# Patient Record
Sex: Male | Born: 1945 | Race: White | Hispanic: No | Marital: Married | Smoking: Current every day smoker
Health system: Southern US, Community
[De-identification: ages and names within clinical notes are randomized; demographics above are authoritative.]

## PROBLEM LIST (undated history)

## (undated) DIAGNOSIS — F0391 Unspecified dementia with behavioral disturbance: Secondary | ICD-10-CM

## (undated) DIAGNOSIS — R0602 Shortness of breath: Secondary | ICD-10-CM

## (undated) DIAGNOSIS — J9601 Acute respiratory failure with hypoxia: Secondary | ICD-10-CM

## (undated) DIAGNOSIS — L309 Dermatitis, unspecified: Secondary | ICD-10-CM

## (undated) DIAGNOSIS — F419 Anxiety disorder, unspecified: Secondary | ICD-10-CM

## (undated) DIAGNOSIS — J189 Pneumonia, unspecified organism: Secondary | ICD-10-CM

## (undated) DIAGNOSIS — I10 Essential (primary) hypertension: Secondary | ICD-10-CM

## (undated) DIAGNOSIS — J441 Chronic obstructive pulmonary disease with (acute) exacerbation: Secondary | ICD-10-CM

## (undated) DIAGNOSIS — N200 Calculus of kidney: Secondary | ICD-10-CM

## (undated) DIAGNOSIS — J9602 Acute respiratory failure with hypercapnia: Secondary | ICD-10-CM

## (undated) DIAGNOSIS — Z720 Tobacco use: Secondary | ICD-10-CM

## (undated) DIAGNOSIS — K5900 Constipation, unspecified: Secondary | ICD-10-CM

## (undated) DIAGNOSIS — J449 Chronic obstructive pulmonary disease, unspecified: Secondary | ICD-10-CM

## (undated) DIAGNOSIS — D649 Anemia, unspecified: Secondary | ICD-10-CM

## (undated) DIAGNOSIS — K219 Gastro-esophageal reflux disease without esophagitis: Secondary | ICD-10-CM

## (undated) HISTORY — PX: HEMORRHOID SURGERY: SHX153

## (undated) HISTORY — PX: COLONOSCOPY: SHX174

---

## 2006-07-18 ENCOUNTER — Ambulatory Visit (HOSPITAL_COMMUNITY): Admission: RE | Admit: 2006-07-18 | Discharge: 2006-07-18 | Payer: Self-pay | Admitting: Family Medicine

## 2006-08-08 ENCOUNTER — Ambulatory Visit (HOSPITAL_COMMUNITY): Admission: RE | Admit: 2006-08-08 | Discharge: 2006-08-08 | Payer: Self-pay | Admitting: Family Medicine

## 2007-04-22 ENCOUNTER — Ambulatory Visit (HOSPITAL_COMMUNITY): Admission: RE | Admit: 2007-04-22 | Discharge: 2007-04-22 | Payer: Self-pay | Admitting: General Surgery

## 2007-04-22 ENCOUNTER — Encounter (INDEPENDENT_AMBULATORY_CARE_PROVIDER_SITE_OTHER): Payer: Self-pay | Admitting: General Surgery

## 2010-01-31 ENCOUNTER — Ambulatory Visit (HOSPITAL_COMMUNITY)
Admission: RE | Admit: 2010-01-31 | Discharge: 2010-01-31 | Payer: Self-pay | Source: Home / Self Care | Attending: Family Medicine | Admitting: Family Medicine

## 2010-06-27 NOTE — H&P (Signed)
NAMESEABORN, NAKAMA               ACCOUNT NO.:  [id number]   MEDICAL RECORD NO.:  [id number]          PATIENT TYPE:  AMB   LOCATION:  DAY                           FACILITY:  [facility]   PHYSICIAN:  Dalia Heading, M.D.  DATE OF BIRTH:  1945-04-03   DATE OF ADMISSION:  DATE OF DISCHARGE:  LH                              HISTORY & PHYSICAL   CHIEF COMPLAINT:  Need for screening colonoscopy.   HISTORY OF PRESENT ILLNESS:  The patient is a 65 year old white male who  is referred for endoscopic evaluation.  He needs a colonoscopy for  screening purposes.  No abdominal pain, weight loss, nausea, vomiting,  diarrhea, constipation, melena, or hematochezia.  He has never had a  colonoscopy.  There is no family history of colon carcinoma.   PAST MEDICAL HISTORY:  Intrinsic allergy, COPD, hypertension.   PAST SURGICAL HISTORY:  Unremarkable.   CURRENT MEDICATIONS:  1. Benicar.  2. Spiriva.  3. Albuterol inhaler.   ALLERGIES:  No known drug allergies.   REVIEW OF SYSTEMS:  Noncontributory.   PHYSICAL EXAMINATION:  GENERAL:  The patient is a well-developed, well-  nourished, white male in no acute distress.  LUNGS:  Clear to auscultation with equal breath sounds bilaterally.  HEART:  Regular rate and rhythm without S3, S4, or murmurs.  ABDOMEN:  Soft, nontender, and nondistended.  No hepatosplenomegaly or  masses are noted.  RECTAL:  Deferred to the procedure.   IMPRESSION:  Need for screening colonoscopy.   PLAN:  The patient is scheduled for a colonoscopy on April 15, 2007.  The  risks and benefits of the procedure including bleeding and perforation  were fully explained to the patient who gave informed consent.      Dalia Heading, M.D.  Electronically Signed     MAJ/MEDQ  D:  03/20/2007  T:  03/21/2007  Job:  [id number]   cc:   Kirk Ruths, M.D.  Fax: [phone]

## 2010-06-30 NOTE — Procedures (Signed)
Shane Todd, Shane Todd               ACCOUNT NO.:  [id number]   MEDICAL RECORD NO.:  [id number]          PATIENT TYPE:  OUT   LOCATION:  RESP                          FACILITY:  [facility]   PHYSICIAN:  Edward L. Juanetta Gosling, M.D.DATE OF BIRTH:  1945-09-09   DATE OF PROCEDURE:  DATE OF DISCHARGE:                            PULMONARY FUNCTION TEST   1. Spirometry shows a severe ventilatory defect with airflow      obstruction.  2. Lung volumes show airtrapping.  3. DLCO is normal.  4. Arterial blood gases are normal.  5. There was significant bronchodilator improvement.   This study is consistent with COPD.      Edward L. Juanetta Gosling, M.D.  Electronically Signed     ELH/MEDQ  D:  08/09/2006  T:  08/09/2006  Job:  [id number]   cc:   Kirk Ruths, M.D.  Fax: [phone]

## 2010-11-29 LAB — BLOOD GAS, ARTERIAL
Bicarbonate: 28.2 — ABNORMAL HIGH
O2 Saturation: 95.9
Patient temperature: 37
TCO2: 24.3
pCO2 arterial: 43.5
pH, Arterial: 7.428
pO2, Arterial: 78.3 — ABNORMAL LOW

## 2011-04-23 ENCOUNTER — Emergency Department (HOSPITAL_COMMUNITY): Payer: Medicare Other

## 2011-04-23 ENCOUNTER — Other Ambulatory Visit: Payer: Self-pay

## 2011-04-23 ENCOUNTER — Encounter (HOSPITAL_COMMUNITY): Payer: Self-pay | Admitting: *Deleted

## 2011-04-23 ENCOUNTER — Inpatient Hospital Stay (HOSPITAL_COMMUNITY)
Admission: EM | Admit: 2011-04-23 | Discharge: 2011-04-27 | DRG: 208 | Disposition: A | Discharge status: Home / Self Care | Payer: Medicare Other | Attending: Internal Medicine | Admitting: Internal Medicine

## 2011-04-23 DIAGNOSIS — R000 Tachycardia, unspecified: Secondary | ICD-10-CM | POA: Diagnosis present

## 2011-04-23 DIAGNOSIS — G4700 Insomnia, unspecified: Secondary | ICD-10-CM | POA: Diagnosis not present

## 2011-04-23 DIAGNOSIS — J9690 Respiratory failure, unspecified, unspecified whether with hypoxia or hypercapnia: Secondary | ICD-10-CM

## 2011-04-23 DIAGNOSIS — Z79899 Other long term (current) drug therapy: Secondary | ICD-10-CM

## 2011-04-23 DIAGNOSIS — F17200 Nicotine dependence, unspecified, uncomplicated: Secondary | ICD-10-CM | POA: Diagnosis present

## 2011-04-23 DIAGNOSIS — R739 Hyperglycemia, unspecified: Secondary | ICD-10-CM | POA: Diagnosis present

## 2011-04-23 DIAGNOSIS — E860 Dehydration: Secondary | ICD-10-CM | POA: Diagnosis present

## 2011-04-23 DIAGNOSIS — J441 Chronic obstructive pulmonary disease with (acute) exacerbation: Secondary | ICD-10-CM | POA: Diagnosis present

## 2011-04-23 DIAGNOSIS — J449 Chronic obstructive pulmonary disease, unspecified: Secondary | ICD-10-CM

## 2011-04-23 DIAGNOSIS — R4182 Altered mental status, unspecified: Secondary | ICD-10-CM

## 2011-04-23 DIAGNOSIS — I10 Essential (primary) hypertension: Secondary | ICD-10-CM | POA: Diagnosis present

## 2011-04-23 DIAGNOSIS — I498 Other specified cardiac arrhythmias: Secondary | ICD-10-CM | POA: Diagnosis present

## 2011-04-23 DIAGNOSIS — R7309 Other abnormal glucose: Secondary | ICD-10-CM | POA: Diagnosis present

## 2011-04-23 DIAGNOSIS — J9600 Acute respiratory failure, unspecified whether with hypoxia or hypercapnia: Principal | ICD-10-CM | POA: Diagnosis present

## 2011-04-23 DIAGNOSIS — Z720 Tobacco use: Secondary | ICD-10-CM | POA: Diagnosis present

## 2011-04-23 DIAGNOSIS — D649 Anemia, unspecified: Secondary | ICD-10-CM | POA: Diagnosis present

## 2011-04-23 DIAGNOSIS — J189 Pneumonia, unspecified organism: Secondary | ICD-10-CM | POA: Diagnosis present

## 2011-04-23 HISTORY — DX: Anemia, unspecified: D64.9

## 2011-04-23 HISTORY — DX: Anxiety disorder, unspecified: F41.9

## 2011-04-23 HISTORY — DX: Chronic obstructive pulmonary disease, unspecified: J44.9

## 2011-04-23 HISTORY — DX: Tobacco use: Z72.0

## 2011-04-23 HISTORY — DX: Essential (primary) hypertension: I10

## 2011-04-23 LAB — DIFFERENTIAL
Eosinophils Absolute: 0 10*3/uL (ref 0.0–0.7)
Eosinophils Relative: 0 % (ref 0–5)
Lymphs Abs: 1.1 10*3/uL (ref 0.7–4.0)
Monocytes Absolute: 1.1 10*3/uL — ABNORMAL HIGH (ref 0.1–1.0)
Monocytes Relative: 10 % (ref 3–12)
Neutro Abs: 8.9 10*3/uL — ABNORMAL HIGH (ref 1.7–7.7)

## 2011-04-23 LAB — CBC
HCT: 40.5 % (ref 39.0–52.0)
Hemoglobin: 12.9 g/dL — ABNORMAL LOW (ref 13.0–17.0)
RBC: 4.13 MIL/uL — ABNORMAL LOW (ref 4.22–5.81)
WBC: 11.1 10*3/uL — ABNORMAL HIGH (ref 4.0–10.5)

## 2011-04-23 LAB — URINALYSIS, ROUTINE W REFLEX MICROSCOPIC
Glucose, UA: NEGATIVE mg/dL
Leukocytes, UA: NEGATIVE
Nitrite: NEGATIVE
Specific Gravity, Urine: >1.03 — ABNORMAL HIGH (ref 1.005–1.030)
Urobilinogen, UA: 0.2 mg/dL (ref 0.0–1.0)
pH: 6 (ref 5.0–8.0)

## 2011-04-23 LAB — BLOOD GAS, ARTERIAL
Bicarbonate: 35.5 mEq/L — ABNORMAL HIGH (ref 20.0–24.0)
O2 Saturation: 99.6 %
O2 Saturation: 98.5 %
PEEP: 5 cmH2O
Patient temperature: 37
RATE: 15 resp/min
RATE: 15 resp/min
pH, Arterial: 7.356 (ref 7.350–7.450)
pH, Arterial: 7.439 (ref 7.350–7.450)
pO2, Arterial: 115 mmHg — ABNORMAL HIGH (ref 80.0–100.0)

## 2011-04-23 LAB — RAPID URINE DRUG SCREEN, HOSP PERFORMED
Amphetamines: NOT DETECTED
Barbiturates: NOT DETECTED
Cocaine: NOT DETECTED

## 2011-04-23 LAB — COMPREHENSIVE METABOLIC PANEL
Albumin: 2.9 g/dL — ABNORMAL LOW (ref 3.5–5.2)
BUN: 38 mg/dL — ABNORMAL HIGH (ref 6–23)
Calcium: 9.2 mg/dL (ref 8.4–10.5)
Chloride: 96 mEq/L (ref 96–112)
Creatinine, Ser: 0.67 mg/dL (ref 0.50–1.35)
Total Bilirubin: 0.2 mg/dL — ABNORMAL LOW (ref 0.3–1.2)
Total Protein: 6.4 g/dL (ref 6.0–8.3)

## 2011-04-23 LAB — CARDIAC PANEL(CRET KIN+CKTOT+MB+TROPI): Troponin I: <0.3 ng/mL (ref <0.30)

## 2011-04-23 LAB — MRSA PCR SCREENING: MRSA by PCR: NEGATIVE

## 2011-04-23 MED ORDER — PROPOFOL 10 MG/ML IV EMUL
INTRAVENOUS | Status: AC
  Filled 2011-04-23: qty 100

## 2011-04-23 MED ORDER — ASPIRIN 81 MG PO CHEW: 324.0000 mg | CHEWABLE_TABLET | ORAL | Status: AC

## 2011-04-23 MED ORDER — ETOMIDATE 2 MG/ML IV SOLN
INTRAVENOUS | Status: AC
  Administered 2011-04-23: 30 mg
  Filled 2011-04-23: qty 20

## 2011-04-23 MED ORDER — METHYLPREDNISOLONE SODIUM SUCC 125 MG IJ SOLR
80.0000 mg | Freq: Four times a day (QID) | INTRAMUSCULAR | Status: DC
  Administered 2011-04-23 – 2011-04-24 (×2): 80 mg via INTRAVENOUS
  Filled 2011-04-23 (×2): qty 2

## 2011-04-23 MED ORDER — ROCURONIUM BROMIDE 50 MG/5ML IV SOLN
INTRAVENOUS | Status: AC
  Administered 2011-04-23: 75 mg
  Filled 2011-04-23: qty 2

## 2011-04-23 MED ORDER — LORAZEPAM 2 MG/ML IJ SOLN
1.0000 mg | Freq: Once | INTRAMUSCULAR | Status: AC
  Administered 2011-04-23: 1 mg via INTRAVENOUS

## 2011-04-23 MED ORDER — PROPOFOL 10 MG/ML IV EMUL
5.0000 ug/kg/min | INTRAVENOUS | Status: DC
  Administered 2011-04-24: 30 ug/kg/min via INTRAVENOUS
  Administered 2011-04-24: 50 ug/kg/min via INTRAVENOUS
  Administered 2011-04-24: 30 ug/kg/min via INTRAVENOUS
  Administered 2011-04-24: 25 ug/kg/min via INTRAVENOUS
  Administered 2011-04-25: 15 ug/kg/min via INTRAVENOUS
  Filled 2011-04-23 (×5): qty 100

## 2011-04-23 MED ORDER — DEXTROSE 5 % IV SOLN
1.0000 g | Freq: Once | INTRAVENOUS | Status: AC
  Administered 2011-04-23: 1 g via INTRAVENOUS
  Filled 2011-04-23: qty 10

## 2011-04-23 MED ORDER — DEXTROSE 5 % IV SOLN
500.0000 mg | Freq: Once | INTRAVENOUS | Status: DC
  Administered 2011-04-23: 500 mg via INTRAVENOUS
  Filled 2011-04-23: qty 500

## 2011-04-23 MED ORDER — IPRATROPIUM BROMIDE 0.02 % IN SOLN
0.5000 mg | RESPIRATORY_TRACT | Status: DC
  Administered 2011-04-23 – 2011-04-25 (×12): 0.5 mg via RESPIRATORY_TRACT
  Filled 2011-04-23 (×11): qty 2.5

## 2011-04-23 MED ORDER — LORAZEPAM 2 MG/ML IJ SOLN
INTRAMUSCULAR | Status: AC
  Filled 2011-04-23: qty 1

## 2011-04-23 MED ORDER — SODIUM CHLORIDE 0.9 % IV BOLUS (SEPSIS)
1000.0000 mL | Freq: Once | INTRAVENOUS | Status: AC
  Administered 2011-04-23: 1000 mL via INTRAVENOUS

## 2011-04-23 MED ORDER — INSULIN ASPART 100 UNIT/ML ~~LOC~~ SOLN: 0.0000 [IU] | Freq: Every day | SUBCUTANEOUS | Status: DC

## 2011-04-23 MED ORDER — SODIUM CHLORIDE 0.9 % IV SOLN
INTRAVENOUS | Status: DC
  Administered 2011-04-23: 15:00:00 via INTRAVENOUS

## 2011-04-23 MED ORDER — CEFTAROLINE FOSAMIL 600 MG IV SOLR
INTRAVENOUS | Status: AC
  Filled 2011-04-23: qty 600

## 2011-04-23 MED ORDER — SODIUM CHLORIDE 0.9 % IV SOLN
600.0000 mg | Freq: Two times a day (BID) | INTRAVENOUS | Status: DC
  Administered 2011-04-23 – 2011-04-27 (×8): 600 mg via INTRAVENOUS
  Filled 2011-04-23 (×16): qty 600

## 2011-04-23 MED ORDER — ENOXAPARIN SODIUM 40 MG/0.4ML ~~LOC~~ SOLN
40.0000 mg | SUBCUTANEOUS | Status: DC
  Administered 2011-04-23 – 2011-04-26 (×4): 40 mg via SUBCUTANEOUS
  Filled 2011-04-23 (×2): qty 0.4
  Filled 2011-04-23: qty 0.8

## 2011-04-23 MED ORDER — ETOMIDATE 2 MG/ML IV SOLN
INTRAVENOUS | Status: AC
  Filled 2011-04-23: qty 20

## 2011-04-23 MED ORDER — SUCCINYLCHOLINE CHLORIDE 20 MG/ML IJ SOLN
INTRAMUSCULAR | Status: AC
  Filled 2011-04-23: qty 1

## 2011-04-23 MED ORDER — ALBUTEROL SULFATE (5 MG/ML) 0.5% IN NEBU
5.0000 mg | INHALATION_SOLUTION | Freq: Once | RESPIRATORY_TRACT | Status: AC
  Administered 2011-04-23: 5 mg via RESPIRATORY_TRACT
  Filled 2011-04-23: qty 1

## 2011-04-23 MED ORDER — SODIUM CHLORIDE 0.9 % IN NEBU
INHALATION_SOLUTION | RESPIRATORY_TRACT | Status: AC
  Administered 2011-04-23: 3 mL
  Filled 2011-04-23: qty 3

## 2011-04-23 MED ORDER — SODIUM CHLORIDE 0.9 % IV SOLN: 250.0000 mL | INTRAVENOUS | Status: DC | PRN

## 2011-04-23 MED ORDER — ALBUTEROL (5 MG/ML) CONTINUOUS INHALATION SOLN: 10.0000 mg/h | INHALATION_SOLUTION | Freq: Once | RESPIRATORY_TRACT | Status: DC

## 2011-04-23 MED ORDER — ASPIRIN 300 MG RE SUPP
300.0000 mg | RECTAL | Status: AC
  Administered 2011-04-23: 300 mg via RECTAL
  Filled 2011-04-23: qty 1

## 2011-04-23 MED ORDER — LIDOCAINE HCL (CARDIAC) 20 MG/ML IV SOLN
INTRAVENOUS | Status: AC
  Filled 2011-04-23: qty 5

## 2011-04-23 MED ORDER — INSULIN ASPART 100 UNIT/ML ~~LOC~~ SOLN
0.0000 [IU] | Freq: Three times a day (TID) | SUBCUTANEOUS | Status: DC
  Administered 2011-04-24: 3 [IU] via SUBCUTANEOUS
  Administered 2011-04-24: 4 [IU] via SUBCUTANEOUS
  Administered 2011-04-24 – 2011-04-25 (×2): 3 [IU] via SUBCUTANEOUS

## 2011-04-23 MED ORDER — PANTOPRAZOLE SODIUM 40 MG IV SOLR
40.0000 mg | Freq: Every day | INTRAVENOUS | Status: DC
  Administered 2011-04-23 – 2011-04-24 (×2): 40 mg via INTRAVENOUS
  Filled 2011-04-23 (×2): qty 40

## 2011-04-23 MED ORDER — VANCOMYCIN HCL IN DEXTROSE 1-5 GM/200ML-% IV SOLN
1000.0000 mg | Freq: Two times a day (BID) | INTRAVENOUS | Status: DC
  Administered 2011-04-23 – 2011-04-25 (×4): 1000 mg via INTRAVENOUS
  Filled 2011-04-23 (×4): qty 200

## 2011-04-23 MED ORDER — MIDAZOLAM HCL 2 MG/2ML IJ SOLN
1.0000 mg | INTRAMUSCULAR | Status: DC | PRN
  Administered 2011-04-24 (×3): 2 mg via INTRAVENOUS
  Administered 2011-04-25: 1 mg via INTRAVENOUS
  Administered 2011-04-25: 2 mg via INTRAVENOUS
  Filled 2011-04-23 (×5): qty 2

## 2011-04-23 MED ORDER — ALBUTEROL SULFATE (5 MG/ML) 0.5% IN NEBU
2.5000 mg | INHALATION_SOLUTION | RESPIRATORY_TRACT | Status: DC
  Administered 2011-04-23 – 2011-04-25 (×11): 2.5 mg via RESPIRATORY_TRACT
  Filled 2011-04-23 (×11): qty 0.5

## 2011-04-23 MED ORDER — ROCURONIUM BROMIDE 50 MG/5ML IV SOLN
INTRAVENOUS | Status: AC
  Filled 2011-04-23: qty 2

## 2011-04-23 MED ORDER — POTASSIUM CHLORIDE IN NACL 20-0.9 MEQ/L-% IV SOLN
INTRAVENOUS | Status: DC
  Administered 2011-04-23: 18:00:00 via INTRAVENOUS
  Administered 2011-04-24: 1000 mL via INTRAVENOUS
  Filled 2011-04-23: qty 1000

## 2011-04-23 MED ORDER — FENTANYL CITRATE 0.05 MG/ML IJ SOLN: 25.0000 ug | INTRAMUSCULAR | Status: DC | PRN

## 2011-04-23 MED ORDER — PROPOFOL 10 MG/ML IV EMUL
5.0000 ug/kg/min | INTRAVENOUS | Status: DC
  Administered 2011-04-23: 5 ug/kg/min via INTRAVENOUS
  Filled 2011-04-23: qty 20

## 2011-04-23 MED ORDER — VANCOMYCIN HCL IN DEXTROSE 1-5 GM/200ML-% IV SOLN
INTRAVENOUS | Status: AC
  Filled 2011-04-23: qty 400

## 2011-04-23 MED ORDER — METHYLPREDNISOLONE SODIUM SUCC 125 MG IJ SOLR
125.0000 mg | Freq: Once | INTRAMUSCULAR | Status: AC
  Administered 2011-04-23: 125 mg via INTRAVENOUS
  Filled 2011-04-23: qty 2

## 2011-04-23 NOTE — Progress Notes (Signed)
ANTIBIOTIC CONSULT NOTE - INITIAL  Pharmacy Consult for  Vancomycin Indication: pneumonia  No Known Allergies  Patient Measurements: Height: 6\' 1"  (185.4 cm) Weight: 160 lb (72.576 kg) IBW/kg (Calculated) : 79.9  Adjusted Body Weight: n/a  Vital Signs: Temp: 96.9 F (36.1 C) (03/11 1549) Temp src: Rectal (03/11 1549) BP: 115/62 mmHg (03/11 1730) Pulse Rate: 83  (03/11 1730) Intake/Output from previous day:   Intake/Output from this shift:    Labs:  Basename 04/23/11 1306  WBC 11.1*  HGB 12.9*  PLT 310  LABCREA --  CREATININE 0.67   Estimated Creatinine Clearance: 93.3 ml/min (by C-G formula based on Cr of 0.67).    Microbiology: No results found for this or any previous visit (from the past 720 hour(s)).  Medical History: Past Medical History  Diagnosis Date  . COPD (chronic obstructive pulmonary disease)   . Hypertension   . Tobacco abuse     Medications:   Reviewed Assessment: Empiric therapy  Goal of Therapy:  Vancomycin trough level 15-20 mcg/ml  Plan:  Vancomycin 1000 mg IV every 12 hours  Tomi Bamberger J 04/23/2011,7:11 PM

## 2011-04-23 NOTE — H&P (Signed)
Shane Todd MRN: [id number] DOB/AGE: October 11, 1945 66 y.o. Primary Care Physician:MCGOUGH,WILLIAM M, MD, MD Admit date: 04/23/2011 Chief Complaint: Shortness of breath History of present illness: The patient is a 66 year old man with a past medical history significant for COPD and hypertension, who presents to the emergency department with a chief complaint of shortness of breath. The patient decompensated in the emergency department and was not protecting his airway. Therefore, he was intubated and mechanically ventilated. The history is being provided by his wife, Mrs. Juanito Doom. Accordingly, over the past few days, the patient has complained of shortness of breath. He had flulike symptoms including shortness of breath, fever, chills, and a productive cough with green sputum all of last week. He had also been wheezing. He took Mucinex which did not help. His appetite decreased. His urine became concentrated. He became more lethargic and sleepy this morning. At approximately for 10:30 AM, he was noticed to be grunting. He became virtually nonresponsive. EMS was called. When they arrived, reportedly, his oxygen saturation was 85%. He was given a breathing treatment and oxygen and transported to the emergency department.  In the emergency department, he is hemodynamically stable and afebrile. Just recently, his blood pressure did decrease to the 90s systolically and his heart rate went up to 122, this is status post intubation. His initial chest x-ray revealed left basilar consolidation/pneumonia. His EKG revealed normal sinus rhythm with no acute abnormalities. His ABG following intubation revealed a pH of 7.35, PCO2 of 67, and a PO2 of 494. His urine drug screen is unremarkable. His urine specific gravity is greater than 1.030. He is being admitted for further evaluation and management.   Past Medical History  Diagnosis Date  . COPD (chronic obstructive pulmonary disease)   . Hypertension   .  Tobacco abuse     No past surgical history on file.  Prior to Admission medications   Not on File  Medications: 1. Lisinopril 5 mg once daily. 2. Amlodipine 5 mg once daily. 3. Proventil HF a 90 mcg one puff 4 times daily as needed. 4. Spiriva inhaler one inhalation daily. 5. Alprazolam 0.5 mg 1-2 daily as needed.  Allergies: No Known Allergies  Family history: His mother died of a stroke. His father died of a heart attack.  Social History: The patient is married. He lives in [place]. He is a retired IT sales professional. He has 3 children. He smokes a half a pack to a pack of cigarettes per day. He has been smoking for more than 40 years. No history of alcohol or illicit drug use.      ROS: As above in history present illness.  PHYSICAL EXAM: Blood pressure 106/65, pulse 81, resp. rate 15, height 6\' 1"  (1.854 m), weight 72.576 kg (160 lb), SpO2 100.00%.   Basic Metabolic Panel:  Basename 04/23/11 1306  NA 139  K 4.9  CL 96  CO2 40*  GLUCOSE 149*  BUN 38*  CREATININE 0.67  CALCIUM 9.2  MG --  PHOS --   Liver Function Tests:  Basename 04/23/11 1306  AST 20  ALT 15  ALKPHOS 93  BILITOT 0.2*  PROT 6.4  ALBUMIN 2.9*   No results found for this basename: LIPASE:2,AMYLASE:2 in the last 72 hours No results found for this basename: AMMONIA:2 in the last 72 hours CBC:  Basename 04/23/11 1306  WBC 11.1*  NEUTROABS 8.9*  HGB 12.9*  HCT 40.5  MCV 98.1  PLT 310   Cardiac Enzymes: No results found  for this basename: CKTOTAL:3,CKMB:3,CKMBINDEX:3,TROPONINI:3 in the last 72 hours BNP: No results found for this basename: PROBNP:3 in the last 72 hours D-Dimer: No results found for this basename: DDIMER:2 in the last 72 hours CBG: No results found for this basename: GLUCAP:6 in the last 72 hours Hemoglobin A1C: No results found for this basename: HGBA1C in the last 72 hours Fasting Lipid Panel: No results found for this basename:  CHOL,HDL,LDLCALC,TRIG,CHOLHDL,LDLDIRECT in the last 72 hours Thyroid Function Tests: No results found for this basename: TSH,T4TOTAL,FREET4,T3FREE,THYROIDAB in the last 72 hours Anemia Panel: No results found for this basename: VITAMINB12,FOLATE,FERRITIN,TIBC,IRON,RETICCTPCT in the last 72 hours Coagulation: No results found for this basename: LABPROT:2,INR:2 in the last 72 hours Urine Drug Screen: Drugs of Abuse     Component Value Date/Time   LABOPIA NONE DETECTED 04/23/2011 1259   COCAINSCRNUR NONE DETECTED 04/23/2011 1259   LABBENZ NONE DETECTED 04/23/2011 1259   AMPHETMU NONE DETECTED 04/23/2011 1259   THCU NONE DETECTED 04/23/2011 1259   LABBARB NONE DETECTED 04/23/2011 1259    Alcohol Level: No results found for this basename: ETH:2 in the last 72 hours Urinalysis:  Basename 04/23/11 1259  COLORURINE YELLOW  LABSPEC >1.030*  PHURINE 6.0  GLUCOSEU NEGATIVE  HGBUR SMALL*  BILIRUBINUR NEGATIVE  KETONESUR NEGATIVE  PROTEINUR 30*  UROBILINOGEN 0.2  NITRITE NEGATIVE  LEUKOCYTESUR NEGATIVE   Misc. Labs:    No results found for this or any previous visit (from the past 240 hour(s)).   Results for orders placed during the hospital encounter of 04/23/11 (from the past 48 hour(s))  URINE RAPID DRUG SCREEN (HOSP PERFORMED)     Status: Normal   Collection Time   04/23/11 12:59 PM      Component Value Range Comment   Opiates NONE DETECTED  NONE DETECTED     Cocaine NONE DETECTED  NONE DETECTED     Benzodiazepines NONE DETECTED  NONE DETECTED     Amphetamines NONE DETECTED  NONE DETECTED     Tetrahydrocannabinol NONE DETECTED  NONE DETECTED     Barbiturates NONE DETECTED  NONE DETECTED    URINALYSIS, ROUTINE W REFLEX MICROSCOPIC     Status: Abnormal   Collection Time   04/23/11 12:59 PM      Component Value Range Comment   Color, Urine YELLOW  YELLOW     APPearance CLEAR  CLEAR     Specific Gravity, Urine >1.030 (*) 1.005 - 1.030     pH 6.0  5.0 - 8.0     Glucose, UA  NEGATIVE  NEGATIVE (mg/dL)    Hgb urine dipstick SMALL (*) NEGATIVE     Bilirubin Urine NEGATIVE  NEGATIVE     Ketones, ur NEGATIVE  NEGATIVE (mg/dL)    Protein, ur 30 (*) NEGATIVE (mg/dL)    Urobilinogen, UA 0.2  0.0 - 1.0 (mg/dL)    Nitrite NEGATIVE  NEGATIVE     Leukocytes, UA NEGATIVE  NEGATIVE    URINE MICROSCOPIC-ADD ON     Status: Normal   Collection Time   04/23/11 12:59 PM      Component Value Range Comment   WBC, UA 0-2  <3 (WBC/hpf)    RBC / HPF 3-6  <3 (RBC/hpf)   LACTIC ACID, PLASMA     Status: Normal   Collection Time   04/23/11  1:05 PM      Component Value Range Comment   Lactic Acid, Venous 0.9  0.5 - 2.2 (mmol/L)   CBC     Status: Abnormal  Collection Time   04/23/11  1:06 PM      Component Value Range Comment   WBC 11.1 (*) 4.0 - 10.5 (K/uL)    RBC 4.13 (*) 4.22 - 5.81 (MIL/uL)    Hemoglobin 12.9 (*) 13.0 - 17.0 (g/dL)    HCT 66.4  40.3 - 47.4 (%)    MCV 98.1  78.0 - 100.0 (fL)    MCH 31.2  26.0 - 34.0 (pg)    MCHC 31.9  30.0 - 36.0 (g/dL)    RDW 25.9  56.3 - 87.5 (%)    Platelets 310  150 - 400 (K/uL)   DIFFERENTIAL     Status: Abnormal   Collection Time   04/23/11  1:06 PM      Component Value Range Comment   Neutrophils Relative 80 (*) 43 - 77 (%)    Neutro Abs 8.9 (*) 1.7 - 7.7 (K/uL)    Lymphocytes Relative 10 (*) 12 - 46 (%)    Lymphs Abs 1.1  0.7 - 4.0 (K/uL)    Monocytes Relative 10  3 - 12 (%)    Monocytes Absolute 1.1 (*) 0.1 - 1.0 (K/uL)    Eosinophils Relative 0  0 - 5 (%)    Eosinophils Absolute 0.0  0.0 - 0.7 (K/uL)    Basophils Relative 0  0 - 1 (%)    Basophils Absolute 0.0  0.0 - 0.1 (K/uL)   COMPREHENSIVE METABOLIC PANEL     Status: Abnormal   Collection Time   04/23/11  1:06 PM      Component Value Range Comment   Sodium 139  135 - 145 (mEq/L)    Potassium 4.9  3.5 - 5.1 (mEq/L)    Chloride 96  96 - 112 (mEq/L)    CO2 40 (*) 19 - 32 (mEq/L)    Glucose, Bld 149 (*) 70 - 99 (mg/dL)    BUN 38 (*) 6 - 23 (mg/dL)    Creatinine, Ser  6.43  0.50 - 1.35 (mg/dL)    Calcium 9.2  8.4 - 10.5 (mg/dL)    Total Protein 6.4  6.0 - 8.3 (g/dL)    Albumin 2.9 (*) 3.5 - 5.2 (g/dL)    AST 20  0 - 37 (U/L)    ALT 15  0 - 53 (U/L)    Alkaline Phosphatase 93  39 - 117 (U/L)    Total Bilirubin 0.2 (*) 0.3 - 1.2 (mg/dL)    GFR calc non Af Amer >90  >90 (mL/min)    GFR calc Af Amer >90  >90 (mL/min)   BLOOD GAS, ARTERIAL     Status: Abnormal   Collection Time   04/23/11  2:05 PM      Component Value Range Comment   FIO2 100.00      Delivery systems VENTILATOR      Mode PRESSURE REGULATED VOLUME CONTROL      VT 500      Rate 15      Peep/cpap 0.5      pH, Arterial 7.356  7.350 - 7.450     pCO2 arterial 67.2 (*) 35.0 - 45.0 (mmHg)    pO2, Arterial 494.0 (*) 80.0 - 100.0 (mmHg)    Bicarbonate 36.7 (*) 20.0 - 24.0 (mEq/L)    TCO2 33.5  0 - 100 (mmol/L)    Acid-Base Excess 10.9 (*) 0.0 - 2.0 (mmol/L)    O2 Saturation 99.6      Patient temperature 37.0      Collection  site RIGHT RADIAL      Drawn by COLLECTED BY RT      Allens test (pass/fail) PASS  PASS      Ct Head Wo Contrast  04/23/2011  *RADIOLOGY REPORT*  Clinical Data: Shortness of breath, unresponsive  CT HEAD WITHOUT CONTRAST  Technique:  Contiguous axial images were obtained from the base of the skull through the vertex without contrast.  Comparison: None.  Findings: No evidence of parenchymal hemorrhage or extra-axial fluid collection. No mass lesion, mass effect, or midline shift.  No CT evidence of acute infarction.  Subcortical white matter and periventricular small vessel ischemic changes, including in the subcortical left frontal lobe (series 2/image 21).  Intracranial atherosclerosis.  Cerebral volume is age appropriate.  No ventriculomegaly.  The visualized paranasal sinuses are essentially clear. The mastoid air cells are unopacified.  No evidence of calvarial fracture.  IMPRESSION: No evidence of acute intracranial abnormality.  Small vessel ischemic changes with  intracranial atherosclerosis.  Original Report Authenticated By: Charline Bills, M.D.   Dg Chest Portable 1 View  04/23/2011  *RADIOLOGY REPORT*  Clinical Data: Shortness of breath.  ET tube placement.  PORTABLE CHEST - 1 VIEW  Comparison: 01/31/2010  Findings: Endotracheal tube is 9 cm above the carina.  Severe COPD changes.  Lung bases are incompletely imaged.  Concern for consolidation in the left lung base.  Cannot assess for effusions. Heart is normal size.  IMPRESSION: Endotracheal tube 9 cm above the carina.  Limited evaluation of the lung bases.  Suspect left basilar consolidation/pneumonia.  Original Report Authenticated By: Cyndie Chime, M.D.   Dg Chest Port 1v Same Day  04/23/2011  *RADIOLOGY REPORT*  Clinical Data: Shortness of breath.  PORTABLE CHEST - 1 VIEW SAME DAY  Comparison: Earlier the same day  Findings: 1417 hours.  Endotracheal tube tip is 6.8 cm above the base of the carina. Hyperexpansion is consistent with emphysema. Interstitial markings are diffusely coarsened with chronic features.  There is biapical pleuroparenchymal density, as before. The airspace opacity the left base is again noted. Telemetry leads overlie the chest.  IMPRESSION: Airspace disease at the left base is suspicious for pneumonia.  Biapical pleuroparenchymal changes may be related to scarring. Follow-up is recommended to further evaluate.  Original Report Authenticated By: ERIC A. MANSELL, M.D.    Impression:  Principal Problem:  *Acute respiratory failure Active Problems:  COPD with exacerbation  Community acquired pneumonia  Sinus tachycardia  Tobacco abuse  Hyperglycemia   1. Acute ventilator dependent respiratory failure, secondary to a combination of COPD with exacerbation and community-acquired pneumonia.  2. Sinus tachycardia secondary to respiratory distress.  3. Tobacco abuse.  4. Hyperglycemia. Possibly secondary to IV steroids.   5. Dehydration as evident by an elevated BUN and  elevated urine specific gravity.  Plan:  1. The patient is status post intubation and mechanical ventilation. The results of his ABG are noted. The oxygen infiltration will be titrated down.  2. He was given azithromycin and Rocephin in the emergency department. He was also given 125 mg of Solu-Medrol. We'll continue antibiotic treatment with Teflaro and steroid treatment with Solu-Medrol. Will add albuterol and Atrovent nebulizers every four-hour schedule. Next  3. [facility] consult pulmonologist Dr. Juanetta Gosling. In the meantime, the patient will be placed on the appropriate sedation protocol. Prophylactic Protonix will be started. An NG tube will be placed per protocol and gentle to feedings will be started over the next 24-48 hours. Next  4. For further evaluation, we will  order cardiac enzymes, TSH, urine Legionella antigen, strep pneumo urinary antigen, and blood cultures. We'll also order a tracheal aspirate culture.  5. Tobacco cessation counseling when he is extubated.    Total critical care time: One hour 20 minutes.   Haliegh Khurana 04/23/2011, 3:30 PM

## 2011-04-23 NOTE — ED Notes (Signed)
Critical value CO2 67.2 shown to Dr. Brooke Dare.

## 2011-04-23 NOTE — ED Notes (Signed)
Diprivan titration up to 30 mcg/kg/min (13.1 ml/hr).

## 2011-04-23 NOTE — ED Provider Notes (Signed)
History   This chart was scribed for Dayton Bailiff, MD by Clarita Crane. The patient was seen in room APA02/APA02. Patient's care was started at 1150.   CSN: [id number]  Arrival date & time 04/23/11  1150   First MD Initiated Contact with Patient 04/23/11 1212      Chief Complaint  Patient presents with  . Shortness of Breath    (Consider location/radiation/quality/duration/timing/severity/associated sxs/prior treatment) The history is provided by the EMS personnel (Family member). The history is limited by the condition of the patient.  A Level 5 Caveat Applies due to patient's condition Shane Todd is a 66 y.o. male who presents to the Emergency Department after being BIB EMS from home for severe SOB. Per EMS, patient was unresponsive upon arrival. Patient's family member notes patient had been experiencing flu symptoms for the past week which became significantly worse the past several days. Patient with h/o COPD and HTN.  Past Medical History  Diagnosis Date  . COPD (chronic obstructive pulmonary disease)   . Hypertension     No past surgical history on file.  No family history on file.  History  Substance Use Topics  . Smoking status: Unknown If Ever Smoked  . Smokeless tobacco: Not on file  . Alcohol Use: No     Review of Systems  Unable to perform ROS: Other   Allergies  Review of patient's allergies indicates no known allergies.  Home Medications  No current outpatient prescriptions on file.  BP 96/62  Pulse 122  Resp 15  Ht 6\' 1"  (1.854 m)  Wt 160 lb (72.576 kg)  BMI 21.11 kg/m2  SpO2 100%  Physical Exam  Nursing note and vitals reviewed. Constitutional: He appears well-developed and well-nourished. He appears lethargic. He appears distressed.       Somnolent  HENT:  Head: Normocephalic and atraumatic.  Eyes: Pupils are equal, round, and reactive to light.  Neck: Neck supple. No tracheal deviation present.  Cardiovascular: Regular rhythm.   Tachycardia present.  Exam reveals no gallop and no friction rub.   No murmur heard. Pulmonary/Chest: He is in respiratory distress. He has wheezes. He has no rales.       Severely diminished breath sounds bilaterally. Increased labor of breathing. Faint wheezing.   Abdominal: Soft. He exhibits no distension.  Musculoskeletal: He exhibits no edema.  Neurological: He appears lethargic. No sensory deficit.       Unable to answer questions. Responds to verbal stimuli with eye opening. Non-verbal.   Skin: Skin is warm and dry.  Psychiatric: He has a normal mood and affect. His behavior is normal.    ED Course  Procedures (including critical care time)   Date: 04/23/2011  Rate: 86  Rhythm: normal sinus rhythm  QRS Axis: normal  Intervals: normal  ST/T Wave abnormalities: normal  Conduction Disutrbances:none  Narrative Interpretation:   Old EKG Reviewed: unchanged  CRITICAL CARE Performed by: Dayton Bailiff   Total critical care time: 32 min  Critical care time was exclusive of separately billable procedures and treating other patients.  Critical care was necessary to treat or prevent imminent or life-threatening deterioration.  Critical care was time spent personally by me on the following activities: development of treatment plan with patient and/or surrogate as well as nursing, discussions with consultants, evaluation of patient's response to treatment, examination of patient, obtaining history from patient or surrogate, ordering and performing treatments and interventions, ordering and review of laboratory studies, ordering and review of radiographic studies, pulse  oximetry and re-evaluation of patient's condition.  EMERGENT INTUBATION PROCEDURE NOTE INDICATION: impending respiratory failure  TECHNIQUE: Unable to obtain consent because of emergent medical necessity.  After pre-oxygenating the patient for several minutes, a modified rapid-sequence induction was performed using  etomidate and rocuronium with cricoid pressure. Using a Glidescope- 4 laryngoscope blade and 8.21mm cuffed endotracheal tube was placed and secured at 24cm at the lip. Placement was confirmed with by auscultation, by CXR and ETCO2 monitor.  COMPLICATIONS: None. The patient tolerated the procedure well with no complications. POST PROCEDURE CXR: pending    DIAGNOSTIC STUDIES: Oxygen Saturation is 98% on non-rebreather, normal by my interpretation.    COORDINATION OF CARE: 12:23PM- Additional history obtained by patient's family member. 12:25PM- Dayton Bailiff, MD to beside to perform intubation.    Results for orders placed during the hospital encounter of 04/23/11  LACTIC ACID, PLASMA      Component Value Range   Lactic Acid, Venous 0.9  0.5 - 2.2 (mmol/L)  URINE RAPID DRUG SCREEN (HOSP PERFORMED)      Component Value Range   Opiates NONE DETECTED  NONE DETECTED    Cocaine NONE DETECTED  NONE DETECTED    Benzodiazepines NONE DETECTED  NONE DETECTED    Amphetamines NONE DETECTED  NONE DETECTED    Tetrahydrocannabinol NONE DETECTED  NONE DETECTED    Barbiturates NONE DETECTED  NONE DETECTED   CBC      Component Value Range   WBC 11.1 (*) 4.0 - 10.5 (K/uL)   RBC 4.13 (*) 4.22 - 5.81 (MIL/uL)   Hemoglobin 12.9 (*) 13.0 - 17.0 (g/dL)   HCT 40.9  81.1 - 91.4 (%)   MCV 98.1  78.0 - 100.0 (fL)   MCH 31.2  26.0 - 34.0 (pg)   MCHC 31.9  30.0 - 36.0 (g/dL)   RDW 78.2  95.6 - 21.3 (%)   Platelets 310  150 - 400 (K/uL)  DIFFERENTIAL      Component Value Range   Neutrophils Relative 80 (*) 43 - 77 (%)   Neutro Abs 8.9 (*) 1.7 - 7.7 (K/uL)   Lymphocytes Relative 10 (*) 12 - 46 (%)   Lymphs Abs 1.1  0.7 - 4.0 (K/uL)   Monocytes Relative 10  3 - 12 (%)   Monocytes Absolute 1.1 (*) 0.1 - 1.0 (K/uL)   Eosinophils Relative 0  0 - 5 (%)   Eosinophils Absolute 0.0  0.0 - 0.7 (K/uL)   Basophils Relative 0  0 - 1 (%)   Basophils Absolute 0.0  0.0 - 0.1 (K/uL)  COMPREHENSIVE METABOLIC PANEL       Component Value Range   Sodium 139  135 - 145 (mEq/L)   Potassium 4.9  3.5 - 5.1 (mEq/L)   Chloride 96  96 - 112 (mEq/L)   CO2 40 (*) 19 - 32 (mEq/L)   Glucose, Bld 149 (*) 70 - 99 (mg/dL)   BUN 38 (*) 6 - 23 (mg/dL)   Creatinine, Ser 0.86  0.50 - 1.35 (mg/dL)   Calcium 9.2  8.4 - 57.8 (mg/dL)   Total Protein 6.4  6.0 - 8.3 (g/dL)   Albumin 2.9 (*) 3.5 - 5.2 (g/dL)   AST 20  0 - 37 (U/L)   ALT 15  0 - 53 (U/L)   Alkaline Phosphatase 93  39 - 117 (U/L)   Total Bilirubin 0.2 (*) 0.3 - 1.2 (mg/dL)   GFR calc non Af Amer >90  >90 (mL/min)   GFR calc Af Amer >  90  >90 (mL/min)  URINALYSIS, ROUTINE W REFLEX MICROSCOPIC      Component Value Range   Color, Urine YELLOW  YELLOW    APPearance CLEAR  CLEAR    Specific Gravity, Urine >1.030 (*) 1.005 - 1.030    pH 6.0  5.0 - 8.0    Glucose, UA NEGATIVE  NEGATIVE (mg/dL)   Hgb urine dipstick SMALL (*) NEGATIVE    Bilirubin Urine NEGATIVE  NEGATIVE    Ketones, ur NEGATIVE  NEGATIVE (mg/dL)   Protein, ur 30 (*) NEGATIVE (mg/dL)   Urobilinogen, UA 0.2  0.0 - 1.0 (mg/dL)   Nitrite NEGATIVE  NEGATIVE    Leukocytes, UA NEGATIVE  NEGATIVE   BLOOD GAS, ARTERIAL      Component Value Range   FIO2 100.00     Delivery systems VENTILATOR     Mode PRESSURE REGULATED VOLUME CONTROL     VT 500     Rate 15     Peep/cpap 0.5     pH, Arterial 7.356  7.350 - 7.450    pCO2 arterial 67.2 (*) 35.0 - 45.0 (mmHg)   pO2, Arterial 494.0 (*) 80.0 - 100.0 (mmHg)   Bicarbonate 36.7 (*) 20.0 - 24.0 (mEq/L)   TCO2 33.5  0 - 100 (mmol/L)   Acid-Base Excess 10.9 (*) 0.0 - 2.0 (mmol/L)   O2 Saturation 99.6     Patient temperature 37.0     Collection site RIGHT RADIAL     Drawn by COLLECTED BY RT     Allens test (pass/fail) PASS  PASS   URINE MICROSCOPIC-ADD ON      Component Value Range   WBC, UA 0-2  <3 (WBC/hpf)   RBC / HPF 3-6  <3 (RBC/hpf)    Ct Head Wo Contrast  04/23/2011  *RADIOLOGY REPORT*  Clinical Data: Shortness of breath, unresponsive  CT HEAD  WITHOUT CONTRAST  Technique:  Contiguous axial images were obtained from the base of the skull through the vertex without contrast.  Comparison: None.  Findings: No evidence of parenchymal hemorrhage or extra-axial fluid collection. No mass lesion, mass effect, or midline shift.  No CT evidence of acute infarction.  Subcortical white matter and periventricular small vessel ischemic changes, including in the subcortical left frontal lobe (series 2/image 21).  Intracranial atherosclerosis.  Cerebral volume is age appropriate.  No ventriculomegaly.  The visualized paranasal sinuses are essentially clear. The mastoid air cells are unopacified.  No evidence of calvarial fracture.  IMPRESSION: No evidence of acute intracranial abnormality.  Small vessel ischemic changes with intracranial atherosclerosis.  Original Report Authenticated By: Charline Bills, M.D.   Dg Chest Portable 1 View  04/23/2011  *RADIOLOGY REPORT*  Clinical Data: Shortness of breath.  ET tube placement.  PORTABLE CHEST - 1 VIEW  Comparison: 01/31/2010  Findings: Endotracheal tube is 9 cm above the carina.  Severe COPD changes.  Lung bases are incompletely imaged.  Concern for consolidation in the left lung base.  Cannot assess for effusions. Heart is normal size.  IMPRESSION: Endotracheal tube 9 cm above the carina.  Limited evaluation of the lung bases.  Suspect left basilar consolidation/pneumonia.  Original Report Authenticated By: Cyndie Chime, M.D.     1. Altered mental status   2. Respiratory failure   3. COPD (chronic obstructive pulmonary disease)   4. CAP (community acquired pneumonia)       MDM  Patient arrived with depressed mental status and significant respiratory distress. His mental status is not adequate for BiPAP  therefore he was promptly intubated. Was discovered to have 3 acquired pneumonia. Placed on Rocephin Zithromax. Blood cultures were drawn. Aerosols were administered through the endotracheal tube. Head CT  is negative. Remainder of his work is relatively unremarkable. He does have an elevated CO2 on ABG. Feel this is likely secondary to a COPD exacerbation with complicating pneumonia. Will be admitted to the ICU given the respiratory failure. Discussed with the triad hospitalists.      I personally performed the services described in this documentation, which was scribed in my presence. The recorded information has been reviewed and considered.    Dayton Bailiff, MD 04/23/11 1425

## 2011-04-23 NOTE — ED Notes (Signed)
Short of breath for 2 days °

## 2011-04-23 NOTE — ED Notes (Signed)
CRITICAL VALUE ALERT  Critical value received:  CO2  Date of notification:  04/23/11  Time of notification:  1346  Critical value read back:yes  Nurse who received alert:  Primitivo Gauze  MD notified (1st page):  1346  Time of first page:  1346  MD notified (2nd page):  Time of second page:  Responding MD:  Dr. Brooke Dare  Time MD responded:  1346

## 2011-04-24 ENCOUNTER — Encounter (HOSPITAL_COMMUNITY): Payer: Self-pay | Admitting: Internal Medicine

## 2011-04-24 DIAGNOSIS — D649 Anemia, unspecified: Secondary | ICD-10-CM

## 2011-04-24 HISTORY — DX: Anemia, unspecified: D64.9

## 2011-04-24 LAB — BLOOD GAS, ARTERIAL
Bicarbonate: 33.3 mEq/L — ABNORMAL HIGH (ref 20.0–24.0)
PEEP: 5 cmH2O
Patient temperature: 37
TCO2: 30.4 mmol/L (ref 0–100)
pCO2 arterial: 52.7 mmHg — ABNORMAL HIGH (ref 35.0–45.0)
pH, Arterial: 7.417 (ref 7.350–7.450)
pO2, Arterial: 115 mmHg — ABNORMAL HIGH (ref 80.0–100.0)

## 2011-04-24 LAB — CBC
HCT: 33.6 % — ABNORMAL LOW (ref 39.0–52.0)
Hemoglobin: 10.7 g/dL — ABNORMAL LOW (ref 13.0–17.0)
RBC: 3.5 MIL/uL — ABNORMAL LOW (ref 4.22–5.81)

## 2011-04-24 LAB — CARDIAC PANEL(CRET KIN+CKTOT+MB+TROPI)
CK, MB: 2.1 ng/mL (ref 0.3–4.0)
CK, MB: 1.6 ng/mL (ref 0.3–4.0)
Relative Index: INVALID (ref 0.0–2.5)
Total CK: 31 U/L (ref 7–232)
Troponin I: <0.3 ng/mL (ref <0.30)
Troponin I: <0.3 ng/mL (ref <0.30)

## 2011-04-24 LAB — GLUCOSE, CAPILLARY
Glucose-Capillary: 166 mg/dL — ABNORMAL HIGH (ref 70–99)
Glucose-Capillary: 124 mg/dL — ABNORMAL HIGH (ref 70–99)

## 2011-04-24 LAB — BASIC METABOLIC PANEL
BUN: 39 mg/dL — ABNORMAL HIGH (ref 6–23)
Chloride: 102 mEq/L (ref 96–112)
GFR calc Af Amer: >90 mL/min (ref >90)
GFR calc non Af Amer: >90 mL/min (ref >90)
Glucose, Bld: 152 mg/dL — ABNORMAL HIGH (ref 70–99)
Potassium: 4.3 mEq/L (ref 3.5–5.1)
Sodium: 139 mEq/L (ref 135–145)

## 2011-04-24 LAB — LEGIONELLA ANTIGEN, URINE: Legionella Antigen, Urine: NEGATIVE

## 2011-04-24 LAB — TSH: TSH: 0.363 u[IU]/mL (ref 0.350–4.500)

## 2011-04-24 LAB — URINE CULTURE
Colony Count: NO GROWTH
Culture  Setup Time: 201303120236

## 2011-04-24 LAB — T4, FREE: Free T4: 1.09 ng/dL (ref 0.80–1.80)

## 2011-04-24 LAB — HEMOGLOBIN A1C: Hgb A1c MFr Bld: 5.9 % — ABNORMAL HIGH (ref <5.7)

## 2011-04-24 LAB — MAGNESIUM: Magnesium: 2.9 mg/dL — ABNORMAL HIGH (ref 1.5–2.5)

## 2011-04-24 MED ORDER — CHLORHEXIDINE GLUCONATE 0.12 % MT SOLN
15.0000 mL | Freq: Two times a day (BID) | OROMUCOSAL | Status: DC
  Administered 2011-04-24 – 2011-04-25 (×3): 15 mL via OROMUCOSAL
  Filled 2011-04-24 (×3): qty 15

## 2011-04-24 MED ORDER — BIOTENE DRY MOUTH MT LIQD
15.0000 mL | Freq: Four times a day (QID) | OROMUCOSAL | Status: DC
  Administered 2011-04-24 – 2011-04-25 (×4): 15 mL via OROMUCOSAL

## 2011-04-24 MED ORDER — INSULIN GLARGINE 100 UNIT/ML ~~LOC~~ SOLN
12.0000 [IU] | Freq: Two times a day (BID) | SUBCUTANEOUS | Status: DC
  Administered 2011-04-24 (×2): 12 [IU] via SUBCUTANEOUS

## 2011-04-24 MED ORDER — KCL IN DEXTROSE-NACL 20-5-0.9 MEQ/L-%-% IV SOLN
INTRAVENOUS | Status: DC
  Administered 2011-04-24 (×2): 100 mL via INTRAVENOUS
  Administered 2011-04-25: 1000 mL via INTRAVENOUS

## 2011-04-24 MED ORDER — METHYLPREDNISOLONE SODIUM SUCC 125 MG IJ SOLR
80.0000 mg | Freq: Three times a day (TID) | INTRAMUSCULAR | Status: DC
  Administered 2011-04-24 (×2): 80 mg via INTRAVENOUS
  Filled 2011-04-24 (×3): qty 2

## 2011-04-24 NOTE — Progress Notes (Signed)
INITIAL ADULT NUTRITION ASSESSMENT Date: 04/24/2011   Time: 10:16 AM  Reason for Assessment: TF recommendations  ASSESSMENT: Male 66 y.o.  Dx: Acute respiratory failure   Past Medical History  Diagnosis Date  . COPD (chronic obstructive pulmonary disease)   . Hypertension   . Tobacco abuse   . Anxiety   . Anemia 04/24/2011   Scheduled Meds:   . albuterol  2.5 mg Nebulization Q4H   And  . ipratropium  0.5 mg Nebulization Q4H  . albuterol  5 mg Nebulization Once  . antiseptic oral rinse  15 mL Mouth Rinse QID  . aspirin  324 mg Oral NOW   Or  . aspirin  300 mg Rectal NOW  . ceFTAROline (TEFLARO) IV  600 mg Intravenous Q12H  . cefTRIAXone (ROCEPHIN)  IV  1 g Intravenous Once  . chlorhexidine  15 mL Mouth Rinse BID  . enoxaparin  40 mg Subcutaneous Q24H  . etomidate      . insulin aspart  0-20 Units Subcutaneous TID WC  . insulin aspart  0-5 Units Subcutaneous QHS  . insulin glargine  12 Units Subcutaneous BID  . lidocaine (cardiac) 100 mg/30ml      . LORazepam  1 mg Intravenous Once  . methylPREDNISolone (SOLU-MEDROL) injection  125 mg Intravenous Once  . methylPREDNISolone (SOLU-MEDROL) injection  80 mg Intravenous Q8H  . pantoprazole (PROTONIX) IV  40 mg Intravenous QHS  . propofol      . rocuronium      . sodium chloride  1,000 mL Intravenous Once  . sodium chloride      . succinylcholine      . vancomycin  1,000 mg Intravenous Q12H  . DISCONTD: albuterol  10 mg/hr Nebulization Once  . DISCONTD: azithromycin  500 mg Intravenous Once  . DISCONTD: methylPREDNISolone (SOLU-MEDROL) injection  80 mg Intravenous Q6H   Continuous Infusions:   . dextrose 5 % and 0.9 % NaCl with KCl 20 mEq/L 100 mL (04/24/11 0942)  . propofol 50 mcg/kg/min (04/24/11 0811)  . DISCONTD: sodium chloride 125 mL/hr at 04/23/11 1431  . DISCONTD: 0.9 % NaCl with KCl 20 mEq / L 125 mL/hr at 04/24/11 0600  . DISCONTD: propofol 5 mcg/kg/min (04/23/11 1304)   PRN Meds:.sodium chloride, fentaNYL,  midazolam  Ht: 6\' 1"  (185.4 cm)  Wt: 162 lb 14.7 oz (73.9 kg)  Ideal Wt:  79.9 kg % Ideal Wt: 92%  Usual Wt:  % Usual Wt:   Body mass index is 21.49 kg/(m^2).  Food/Nutrition Related Hx: Pt intubated and sedated. No NG/OG tube placed at this time.  MD notes EN will be initiated if unable to wean in next 24-48 hr.    CMP     Component Value Date/Time   NA 139 04/24/2011 0441   K 4.3 04/24/2011 0441   CL 102 04/24/2011 0441   CO2 33* 04/24/2011 0441   GLUCOSE 152* 04/24/2011 0441   BUN 39* 04/24/2011 0441   CREATININE 0.70 04/24/2011 0441   CALCIUM 8.6 04/24/2011 0441   PROT 6.4 04/23/2011 1306   ALBUMIN 2.9* 04/23/2011 1306   AST 20 04/23/2011 1306   ALT 15 04/23/2011 1306   ALKPHOS 93 04/23/2011 1306   BILITOT 0.2* 04/23/2011 1306   GFRNONAA >90 04/24/2011 0441   GFRAA >90 04/24/2011 0441    Intake/Output Summary (Last 24 hours) at 04/24/11 1018 Last data filed at 04/24/11 0800  Gross per 24 hour  Intake 2358.65 ml  Output    550 ml  Net 1808.65 ml     Diet Order:  NPO  Supplements/Tube Feeding:none at this time  IVF:    dextrose 5 % and 0.9 % NaCl with KCl 20 mEq/L Last Rate: 100 mL (04/24/11 0942)  propofol Last Rate: 50 mcg/kg/min (04/24/11 1610)  DISCONTD: sodium chloride Last Rate: 125 mL/hr at 04/23/11 1431  DISCONTD: 0.9 % NaCl with KCl 20 mEq / L Last Rate: 125 mL/hr at 04/24/11 0600  DISCONTD: propofol Last Rate: 5 mcg/kg/min (04/23/11 1304)    Estimated Nutritional Needs:   Kcal:1645-1809 kcal per day Protein:117-133 grams per day Fluid:Minimum of 1.7-1.8 L/day  Note: IVF with D5 providing ~340 kcal per day and propofol currently @ 21.8 ml/hr providing (1.1kcal/ml) or 575 kcal per day.   NUTRITION DIAGNOSIS: -Inadequate oral intake (NI-2.1).  Status: Ongoing  RELATED TO: inability to eat  AS EVIDENCE BY: intubated and sedated  MONITORING/EVALUATION(Goals): Goal: If unable to wean within next 24-48 hr EN will be initiated.  Monitor: Vent status  and nutrition plan  EDUCATION NEEDS: -Education not appropriate at this time  INTERVENTION: -If EN becomes an option recommend continuous Osmolite 1.2 via NG/OG tube. Start at 20 ml/hr advance per Adult EN protocol to goal rate 40 ml/hr to provide: 1152 kcal, 53 gr protein and 787 ml water. Add Sugar-free ProStat TID (216 kcal, 45 gr protein) per day.  -IVF adjusted per MD  Dietitian [phone]  DOCUMENTATION CODES Per approved criteria  -Not Applicable    Francene Boyers 04/24/2011, 10:16 AM

## 2011-04-24 NOTE — Progress Notes (Addendum)
Subjective: No acute events overnight. The patient is intubated.  Objective: Vital signs in last 24 hours: Filed Vitals:   04/24/11 0545 04/24/11 0600 04/24/11 0729 04/24/11 0800  BP: 123/70 128/73    Pulse: 81 79    Temp:    97.8 F (36.6 C)  TempSrc:    Axillary  Resp: 15 15    Height:      Weight:      SpO2: 97% 97% 98%     Intake/Output Summary (Last 24 hours) at 04/24/11 1610 Last data filed at 04/24/11 0600  Gross per 24 hour  Intake 2358.65 ml  Output    550 ml  Net 1808.65 ml    Weight change:   Physical exam: General: Sedated 66 year old Caucasian man currently intubated. Lungs: Coarse breath sounds, otherwise no significant wheezing or crackles. Heart: S1, S2, with no murmurs rubs or gallops. Abdomen: Hypoactive bowel sounds, soft, nontender, nondistended. Extremities: No pedal edema. Neurologic: He is sedated on the ventilator. He moves all of his extremities with provocation.    Lab Results: Basic Metabolic Panel:  Basename 04/24/11 0441 04/23/11 1306  NA 139 139  K 4.3 4.9  CL 102 96  CO2 33* 40*  GLUCOSE 152* 149*  BUN 39* 38*  CREATININE 0.70 0.67  CALCIUM 8.6 9.2  MG 2.9* --  PHOS -- --   Liver Function Tests:  Basename 04/23/11 1306  AST 20  ALT 15  ALKPHOS 93  BILITOT 0.2*  PROT 6.4  ALBUMIN 2.9*   No results found for this basename: LIPASE:2,AMYLASE:2 in the last 72 hours No results found for this basename: AMMONIA:2 in the last 72 hours CBC:  Basename 04/24/11 0441 04/23/11 1306  WBC 6.7 11.1*  NEUTROABS -- 8.9*  HGB 10.7* 12.9*  HCT 33.6* 40.5  MCV 96.0 98.1  PLT 266 310   Cardiac Enzymes:  Basename 04/23/11 2318 04/23/11 1524  CKTOTAL 39 47  CKMB 2.1 3.0  CKMBINDEX -- --  TROPONINI <0.30 <0.30   BNP: No results found for this basename: PROBNP:3 in the last 72 hours D-Dimer: No results found for this basename: DDIMER:2 in the last 72 hours CBG:  Basename 04/23/11 1655  GLUCAP 163*   Hemoglobin  A1C:  Basename 04/23/11 1524  HGBA1C 5.9*   Fasting Lipid Panel: No results found for this basename: CHOL,HDL,LDLCALC,TRIG,CHOLHDL,LDLDIRECT in the last 72 hours Thyroid Function Tests:  Basename 04/23/11 1524  TSH 0.363  T4TOTAL --  FREET4 1.09  T3FREE --  THYROIDAB --   Anemia Panel: No results found for this basename: VITAMINB12,FOLATE,FERRITIN,TIBC,IRON,RETICCTPCT in the last 72 hours Coagulation: No results found for this basename: LABPROT:2,INR:2 in the last 72 hours Urine Drug Screen: Drugs of Abuse     Component Value Date/Time   LABOPIA NONE DETECTED 04/23/2011 1259   COCAINSCRNUR NONE DETECTED 04/23/2011 1259   LABBENZ NONE DETECTED 04/23/2011 1259   AMPHETMU NONE DETECTED 04/23/2011 1259   THCU NONE DETECTED 04/23/2011 1259   LABBARB NONE DETECTED 04/23/2011 1259    Alcohol Level: No results found for this basename: ETH:2 in the last 72 hours Urinalysis:  Basename 04/23/11 1259  COLORURINE YELLOW  LABSPEC >1.030*  PHURINE 6.0  GLUCOSEU NEGATIVE  HGBUR SMALL*  BILIRUBINUR NEGATIVE  KETONESUR NEGATIVE  PROTEINUR 30*  UROBILINOGEN 0.2  NITRITE NEGATIVE  LEUKOCYTESUR NEGATIVE   Misc. Labs:   Micro: Recent Results (from the past 240 hour(s))  MRSA PCR SCREENING     Status: Normal   Collection Time   04/23/11  7:22 PM      Component Value Range Status Comment   MRSA by PCR NEGATIVE  NEGATIVE  Final     Studies/Results: Ct Head Wo Contrast  04/23/2011  *RADIOLOGY REPORT*  Clinical Data: Shortness of breath, unresponsive  CT HEAD WITHOUT CONTRAST  Technique:  Contiguous axial images were obtained from the base of the skull through the vertex without contrast.  Comparison: None.  Findings: No evidence of parenchymal hemorrhage or extra-axial fluid collection. No mass lesion, mass effect, or midline shift.  No CT evidence of acute infarction.  Subcortical white matter and periventricular small vessel ischemic changes, including in the subcortical left frontal  lobe (series 2/image 21).  Intracranial atherosclerosis.  Cerebral volume is age appropriate.  No ventriculomegaly.  The visualized paranasal sinuses are essentially clear. The mastoid air cells are unopacified.  No evidence of calvarial fracture.  IMPRESSION: No evidence of acute intracranial abnormality.  Small vessel ischemic changes with intracranial atherosclerosis.  Original Report Authenticated By: Charline Bills, M.D.   Dg Chest Portable 1 View  04/23/2011  *RADIOLOGY REPORT*  Clinical Data: Shortness of breath.  ET tube placement.  PORTABLE CHEST - 1 VIEW  Comparison: 01/31/2010  Findings: Endotracheal tube is 9 cm above the carina.  Severe COPD changes.  Lung bases are incompletely imaged.  Concern for consolidation in the left lung base.  Cannot assess for effusions. Heart is normal size.  IMPRESSION: Endotracheal tube 9 cm above the carina.  Limited evaluation of the lung bases.  Suspect left basilar consolidation/pneumonia.  Original Report Authenticated By: Cyndie Chime, M.D.   Dg Chest Port 1v Same Day  04/23/2011  *RADIOLOGY REPORT*  Clinical Data: Shortness of breath.  PORTABLE CHEST - 1 VIEW SAME DAY  Comparison: Earlier the same day  Findings: 1417 hours.  Endotracheal tube tip is 6.8 cm above the base of the carina. Hyperexpansion is consistent with emphysema. Interstitial markings are diffusely coarsened with chronic features.  There is biapical pleuroparenchymal density, as before. The airspace opacity the left base is again noted. Telemetry leads overlie the chest.  IMPRESSION: Airspace disease at the left base is suspicious for pneumonia.  Biapical pleuroparenchymal changes may be related to scarring. Follow-up is recommended to further evaluate.  Original Report Authenticated By: ERIC A. MANSELL, M.D.    Medications: I have reviewed the patient's current medications.  Assessment: Principal Problem:  *Acute respiratory failure Active Problems:  COPD with exacerbation   Community acquired pneumonia  Sinus tachycardia  Tobacco abuse  Hyperglycemia  Dehydration  Anemia   1. Acute respiratory failure with hypoxia and hypercapnia secondary to a combination of COPD with exacerbation and pneumonia. He is currently stabilizing on the ventilator. He is still hypercapnic. Ventilator settings will be changed per Dr. Juanetta Gosling and respiratory therapy. We'll continue Teflaro and vancomycin. We'll continue Solu-Medrol but will taper to 80 mg Q8 hours. We'll continue albuterol and Atrovent bronchodilators.   Hyperglycemia, secondary to steroids. He is on sliding scale NovoLog. His hemoglobin A1c is 5.9.  Dehydration. Resolving with IV fluids.  Anemia. Will check an anemia panel.  Tobacco abuse. Will counsel when he is extubated.  Plan:   1. Ventilator settings per Dr. Juanetta Gosling and respiratory therapy. We'll decrease Solu-Medrol to every 8 hours as there are no significant bronchospasms currently. 2. We'll add dextrose to his IV fluids and for some nutritional support. If he is not extubated within the next 24-48 hours, will start tube feedings. We'll titrate up the insulin accordingly. 3. We'll  order an anemia panel and continue to monitor his other laboratory studies.   Total critical care time: 40 minutes.    LOS: 1 day   Draylen Lobue 04/24/2011, 8:12 AM

## 2011-04-24 NOTE — Progress Notes (Signed)
Pt weaned today for about 3 hours, pt failed the SBT but did well on Pressure support of 10-12.  Pt has large amounts of yellow secretions that have been suctioned throughout the day.  Will attempt to wean again in the am.

## 2011-04-24 NOTE — Plan of Care (Signed)
Problem: Consults Goal: Ventilated Patients Patient Education See Patient Education Module for education specifics. Outcome: Progressing Patient admitted on ventilator, tolerating well Goal: Skin Care Protocol Initiated - if indicated If consults are not indicated, leave blank or document N/A Outcome: Progressing Turning patient CHG completed on admission Goal: Nutrition Consult-if indicated Outcome: Not Progressing Poor appetite for the last few days  Problem: Phase I Progression Outcomes Goal: VTE prophylaxis Outcome: Progressing lovenox given in ED Goal: GIProphysixis protonix for GI Goal: Oral Care per Protocol Outcome: Progressing Q 4 hr mouthcare Goal: Pneumonia/flu vaccination screen completed Completed  Goal: Code status addressed with pt/family Outcome: Progressing Patient is a full code Goal: Pain controlled with appropriate interventions Outcome: Not Applicable Date Met:  04/24/11 No pain on CPOT scoring

## 2011-04-24 NOTE — Consult Note (Signed)
Consult requested by: Dr. Sherrie Mustache Consult requested for respiratory failure  HPI: This is a 66 year old Caucasian male who has a past history of COPD and hypertension. He has had problems for several days came to the emergency department and when he was in the emergency department he ended up having decompensation requiring intubation and mechanical ventilation. He has apparently had shortness of breath fever chills cough and wheezing. This information is from the medical record. Chest x-ray shows what appears to be a pneumonia.  Past Medical History  Diagnosis Date  . COPD (chronic obstructive pulmonary disease)   . Hypertension   . Tobacco abuse   . Anxiety   . Anemia 04/24/2011     Family History  Problem Relation Age of Onset  . Stroke Mother   . Deep vein thrombosis Father   . Lung cancer Sister   . Stroke Sister   . Hypertension Sister   . Heart disease Brother   . Stroke Brother   . Stroke Sister   . Heart disease Sister      History   Social History  . Marital Status: Married    Spouse Name: N/A    Number of Children: N/A  . Years of Education: N/A   Social History Main Topics  . Smoking status: Current Everyday Smoker -- 0.5 packs/day  . Smokeless tobacco: None  . Alcohol Use: No  . Drug Use:   . Sexually Active: Yes   Other Topics Concern  . None   Social History Narrative  . None     ROS: Unobtainable    Objective: Vital signs in last 24 hours: Temp:  [96.9 F (36.1 C)-97.6 F (36.4 C)] 97.4 F (36.3 C) (03/12 0400) Pulse Rate:  [57-122] 79  (03/12 0600) Resp:  [7-20] 15  (03/12 0600) BP: (88-158)/(53-82) 128/73 mmHg (03/12 0600) SpO2:  [95 %-100 %] 98 % (03/12 0729) FiO2 (%):  [40 %-100 %] 40 % (03/12 0729) Weight:  [71.8 kg (158 lb 4.6 oz)-73.9 kg (162 lb 14.7 oz)] 73.9 kg (162 lb 14.7 oz) (03/12 0500) Weight change:  Last BM Date: 04/22/11 (had taken a laxative at home and was successful)  Intake/Output from previous day: 03/11 0701 -  03/12 0700 In: 2358.7 [I.V.:1658.7; IV Piggyback:700] Out: 550 [Urine:550]  PHYSICAL EXAM He is intubated and on the ventilator. He looks fairly comfortable. He is sedated. His pupils are reactive. His nose and throat are clear. His neck is supple without masses. His chest shows decreased breath sounds and end expiratory wheezes. His heart is regular without gallop. His abdomen is soft. Extremities showed no edema. His central nervous system examination shows he is sedated but apparently has been moving all 4 extremities. His wakeup assessment has not occurred yet.  Lab Results: Basic Metabolic Panel:  Basename 04/24/11 0441 04/23/11 1306  NA 139 139  K 4.3 4.9  CL 102 96  CO2 33* 40*  GLUCOSE 152* 149*  BUN 39* 38*  CREATININE 0.70 0.67  CALCIUM 8.6 9.2  MG 2.9* --  PHOS -- --   Liver Function Tests:  Basename 04/23/11 1306  AST 20  ALT 15  ALKPHOS 93  BILITOT 0.2*  PROT 6.4  ALBUMIN 2.9*   No results found for this basename: LIPASE:2,AMYLASE:2 in the last 72 hours No results found for this basename: AMMONIA:2 in the last 72 hours CBC:  Basename 04/24/11 0441 04/23/11 1306  WBC 6.7 11.1*  NEUTROABS -- 8.9*  HGB 10.7* 12.9*  HCT 33.6* 40.5  MCV 96.0 98.1  PLT 266 310   Cardiac Enzymes:  Basename 04/23/11 2318 04/23/11 1524  CKTOTAL 39 47  CKMB 2.1 3.0  CKMBINDEX -- --  TROPONINI <0.30 <0.30   BNP: No results found for this basename: PROBNP:3 in the last 72 hours D-Dimer: No results found for this basename: DDIMER:2 in the last 72 hours CBG:  Basename 04/23/11 1655  GLUCAP 163*   Hemoglobin A1C:  Basename 04/23/11 1524  HGBA1C 5.9*   Fasting Lipid Panel: No results found for this basename: CHOL,HDL,LDLCALC,TRIG,CHOLHDL,LDLDIRECT in the last 72 hours Thyroid Function Tests:  Basename 04/23/11 1524  TSH 0.363  T4TOTAL --  FREET4 1.09  T3FREE --  THYROIDAB --   Anemia Panel: No results found for this basename:  VITAMINB12,FOLATE,FERRITIN,TIBC,IRON,RETICCTPCT in the last 72 hours Coagulation: No results found for this basename: LABPROT:2,INR:2 in the last 72 hours Urine Drug Screen: Drugs of Abuse     Component Value Date/Time   LABOPIA NONE DETECTED 04/23/2011 1259   COCAINSCRNUR NONE DETECTED 04/23/2011 1259   LABBENZ NONE DETECTED 04/23/2011 1259   AMPHETMU NONE DETECTED 04/23/2011 1259   THCU NONE DETECTED 04/23/2011 1259   LABBARB NONE DETECTED 04/23/2011 1259    Alcohol Level: No results found for this basename: ETH:2 in the last 72 hours Urinalysis:  Basename 04/23/11 1259  COLORURINE YELLOW  LABSPEC >1.030*  PHURINE 6.0  GLUCOSEU NEGATIVE  HGBUR SMALL*  BILIRUBINUR NEGATIVE  KETONESUR NEGATIVE  PROTEINUR 30*  UROBILINOGEN 0.2  NITRITE NEGATIVE  LEUKOCYTESUR NEGATIVE   Misc. Labs:   ABGS:  Basename 04/23/11 1800  PHART 7.439  PO2ART 115.0*  TCO2 31.9  HCO3 35.5*     MICROBIOLOGY: Recent Results (from the past 240 hour(s))  MRSA PCR SCREENING     Status: Normal   Collection Time   04/23/11  7:22 PM      Component Value Range Status Comment   MRSA by PCR NEGATIVE  NEGATIVE  Final     Studies/Results: Ct Head Wo Contrast  04/23/2011  *RADIOLOGY REPORT*  Clinical Data: Shortness of breath, unresponsive  CT HEAD WITHOUT CONTRAST  Technique:  Contiguous axial images were obtained from the base of the skull through the vertex without contrast.  Comparison: None.  Findings: No evidence of parenchymal hemorrhage or extra-axial fluid collection. No mass lesion, mass effect, or midline shift.  No CT evidence of acute infarction.  Subcortical white matter and periventricular small vessel ischemic changes, including in the subcortical left frontal lobe (series 2/image 21).  Intracranial atherosclerosis.  Cerebral volume is age appropriate.  No ventriculomegaly.  The visualized paranasal sinuses are essentially clear. The mastoid air cells are unopacified.  No evidence of calvarial  fracture.  IMPRESSION: No evidence of acute intracranial abnormality.  Small vessel ischemic changes with intracranial atherosclerosis.  Original Report Authenticated By: Charline Bills, M.D.   Dg Chest Portable 1 View  04/23/2011  *RADIOLOGY REPORT*  Clinical Data: Shortness of breath.  ET tube placement.  PORTABLE CHEST - 1 VIEW  Comparison: 01/31/2010  Findings: Endotracheal tube is 9 cm above the carina.  Severe COPD changes.  Lung bases are incompletely imaged.  Concern for consolidation in the left lung base.  Cannot assess for effusions. Heart is normal size.  IMPRESSION: Endotracheal tube 9 cm above the carina.  Limited evaluation of the lung bases.  Suspect left basilar consolidation/pneumonia.  Original Report Authenticated By: Cyndie Chime, M.D.   Dg Chest Port 1v Same Day  04/23/2011  *RADIOLOGY REPORT*  Clinical Data:  Shortness of breath.  PORTABLE CHEST - 1 VIEW SAME DAY  Comparison: Earlier the same day  Findings: 1417 hours.  Endotracheal tube tip is 6.8 cm above the base of the carina. Hyperexpansion is consistent with emphysema. Interstitial markings are diffusely coarsened with chronic features.  There is biapical pleuroparenchymal density, as before. The airspace opacity the left base is again noted. Telemetry leads overlie the chest.  IMPRESSION: Airspace disease at the left base is suspicious for pneumonia.  Biapical pleuroparenchymal changes may be related to scarring. Follow-up is recommended to further evaluate.  Original Report Authenticated By: ERIC A. MANSELL, M.D.    Medications:  Scheduled:   . albuterol  2.5 mg Nebulization Q4H   And  . ipratropium  0.5 mg Nebulization Q4H  . albuterol  5 mg Nebulization Once  . antiseptic oral rinse  15 mL Mouth Rinse QID  . aspirin  324 mg Oral NOW   Or  . aspirin  300 mg Rectal NOW  . ceFTAROline (TEFLARO) IV  600 mg Intravenous Q12H  . cefTRIAXone (ROCEPHIN)  IV  1 g Intravenous Once  . chlorhexidine  15 mL Mouth Rinse BID   . enoxaparin  40 mg Subcutaneous Q24H  . etomidate      . insulin aspart  0-20 Units Subcutaneous TID WC  . insulin aspart  0-5 Units Subcutaneous QHS  . lidocaine (cardiac) 100 mg/71ml      . LORazepam  1 mg Intravenous Once  . methylPREDNISolone (SOLU-MEDROL) injection  125 mg Intravenous Once  . methylPREDNISolone (SOLU-MEDROL) injection  80 mg Intravenous Q6H  . pantoprazole (PROTONIX) IV  40 mg Intravenous QHS  . propofol      . rocuronium      . sodium chloride  1,000 mL Intravenous Once  . sodium chloride      . succinylcholine      . vancomycin  1,000 mg Intravenous Q12H  . DISCONTD: albuterol  10 mg/hr Nebulization Once  . DISCONTD: azithromycin  500 mg Intravenous Once   Continuous:   . 0.9 % NaCl with KCl 20 mEq / L 125 mL/hr at 04/24/11 0600  . propofol 30 mcg/kg/min (04/24/11 0600)  . DISCONTD: sodium chloride 125 mL/hr at 04/23/11 1431  . DISCONTD: propofol 5 mcg/kg/min (04/23/11 1304)   UJW:JXBJYN chloride, fentaNYL, midazolam  Assesment: He has acute respiratory failure. He has pneumonia. He has a history of COPD. Principal Problem:  *Acute respiratory failure Active Problems:  COPD with exacerbation  Community acquired pneumonia  Sinus tachycardia  Tobacco abuse  Hyperglycemia  Dehydration  Anemia    Plan: Continue with ventilator support. Weaning can be attempted. Continue his IV antibiotics et Karie Soda.    LOS: 1 day   Angy Swearengin L 04/24/2011, 7:35 AM

## 2011-04-25 ENCOUNTER — Inpatient Hospital Stay (HOSPITAL_COMMUNITY): Payer: Medicare Other

## 2011-04-25 DIAGNOSIS — I10 Essential (primary) hypertension: Secondary | ICD-10-CM | POA: Diagnosis present

## 2011-04-25 LAB — GLUCOSE, CAPILLARY
Glucose-Capillary: 126 mg/dL — ABNORMAL HIGH (ref 70–99)
Glucose-Capillary: 114 mg/dL — ABNORMAL HIGH (ref 70–99)
Glucose-Capillary: 122 mg/dL — ABNORMAL HIGH (ref 70–99)
Glucose-Capillary: 123 mg/dL — ABNORMAL HIGH (ref 70–99)

## 2011-04-25 LAB — BASIC METABOLIC PANEL
Chloride: 107 mEq/L (ref 96–112)
GFR calc Af Amer: >90 mL/min (ref >90)
Potassium: 4.8 mEq/L (ref 3.5–5.1)

## 2011-04-25 LAB — FOLATE: Folate: 4.3 ng/mL

## 2011-04-25 LAB — BLOOD GAS, ARTERIAL
Bicarbonate: 32.5 mEq/L — ABNORMAL HIGH (ref 20.0–24.0)
PEEP: 5 cmH2O
Patient temperature: 37
pH, Arterial: 7.401 (ref 7.350–7.450)
pO2, Arterial: 102 mmHg — ABNORMAL HIGH (ref 80.0–100.0)

## 2011-04-25 LAB — CBC
HCT: 35.3 % — ABNORMAL LOW (ref 39.0–52.0)
Hemoglobin: 11.3 g/dL — ABNORMAL LOW (ref 13.0–17.0)
WBC: 11.2 10*3/uL — ABNORMAL HIGH (ref 4.0–10.5)

## 2011-04-25 LAB — IRON AND TIBC
Iron: 51 ug/dL (ref 42–135)
Saturation Ratios: 28 % (ref 20–55)
UIBC: 129 ug/dL (ref 125–400)

## 2011-04-25 MED ORDER — ALBUTEROL SULFATE (5 MG/ML) 0.5% IN NEBU
2.5000 mg | INHALATION_SOLUTION | RESPIRATORY_TRACT | Status: DC
  Administered 2011-04-25 – 2011-04-26 (×6): 2.5 mg via RESPIRATORY_TRACT
  Filled 2011-04-25 (×6): qty 0.5

## 2011-04-25 MED ORDER — ALPRAZOLAM 0.5 MG PO TABS
0.5000 mg | ORAL_TABLET | Freq: Every evening | ORAL | Status: DC | PRN
  Administered 2011-04-27: 0.5 mg via ORAL
  Filled 2011-04-25: qty 1

## 2011-04-25 MED ORDER — METHYLPREDNISOLONE SODIUM SUCC 125 MG IJ SOLR
80.0000 mg | Freq: Two times a day (BID) | INTRAMUSCULAR | Status: DC
  Administered 2011-04-25 – 2011-04-26 (×2): 80 mg via INTRAVENOUS
  Filled 2011-04-25 (×2): qty 2

## 2011-04-25 MED ORDER — IPRATROPIUM BROMIDE 0.02 % IN SOLN
0.5000 mg | Freq: Four times a day (QID) | RESPIRATORY_TRACT | Status: DC
  Administered 2011-04-25 – 2011-04-26 (×4): 0.5 mg via RESPIRATORY_TRACT
  Filled 2011-04-25 (×4): qty 2.5

## 2011-04-25 MED ORDER — DEXTROSE 5 % IV SOLN
500.0000 mg | INTRAVENOUS | Status: DC
  Administered 2011-04-25: 500 mg via INTRAVENOUS
  Filled 2011-04-25 (×2): qty 500

## 2011-04-25 NOTE — Procedures (Signed)
Extubation Procedure Note  Patient Details:   Name: Shane Todd DOB: 04/16/1945 MRN: [id number]   Airway Documentation:  AIRWAYS 8 mm (Active)  Secured at (cm) 24 cm 04/23/2011  7:00 PM  Measured From Lips 04/23/2011  7:00 PM  Secured Location Right 04/23/2011  7:00 PM  Secured By [employer] 04/23/2011  7:00 PM  Site Condition Dry 04/23/2011  7:00 PM     Airway 8 mm (Active)  Secured at (cm) 27 cm 04/25/2011  7:07 AM  Measured From Lips 04/25/2011  7:07 AM  Secured Location Left 04/25/2011  7:07 AM  Secured By [employer] 04/25/2011  7:07 AM  Tube Holder Repositioned Yes 04/25/2011  7:07 AM  Cuff Pressure (cm H2O) 22 cm H2O 04/25/2011 12:03 AM  Site Condition Dry 04/25/2011  7:07 AM  Pt extubated per Dr. Juanetta Gosling.  Pt started weaning this morning at 0650 on cpap 5/5 and did very well.  Pt now on 3 lpm ncan, sats 99%. Pt still has a lot of secretions but does have a strong cough.   Will continue to monitor.  Evaluation  O2 sats: stable throughout Complications: No apparent complications Patient did tolerate procedure well. Bilateral Breath Sounds: Diminished Suctioning: Oral Yes  Noreene Filbert 04/25/2011, 7:57 AM

## 2011-04-25 NOTE — Progress Notes (Signed)
Chart reviewed. Just extubated.  Subjective: No shortness of breath. Thursday. No pain.  Objective: Vital signs in last 24 hours: Filed Vitals:   04/25/11 0645 04/25/11 0700 04/25/11 0717 04/25/11 0800  BP:  160/80  147/78  Pulse: 83 81    Temp:    98.1 F (36.7 C)  TempSrc:    Oral  Resp: 22 12    Height:      Weight:      SpO2: 98% 97% 99%    Weight change: 3.524 kg (7 lb 12.3 oz)  Intake/Output Summary (Last 24 hours) at 04/25/11 0905 Last data filed at 04/25/11 0800  Gross per 24 hour  Intake 3675.19 ml  Output   1050 ml  Net 2625.19 ml   Physical Exam: General: Alert. Appropriate. Lungs: Bilateral wheeze Cardiovascular regular rate rhythm without murmurs gallops rubs Abdomen soft nontender nondistended Extremities no clubbing cyanosis or edema  Lab Results: Basic Metabolic Panel:  Lab 04/25/11 1610 04/24/11 0441  NA 142 139  K 4.8 4.3  CL 107 102  CO2 32 33*  GLUCOSE 164* 152*  BUN 36* 39*  CREATININE 0.70 0.70  CALCIUM 8.6 8.6  MG -- 2.9*  PHOS -- --   Liver Function Tests:  Lab 04/23/11 1306  AST 20  ALT 15  ALKPHOS 93  BILITOT 0.2*  PROT 6.4  ALBUMIN 2.9*   No results found for this basename: LIPASE:2,AMYLASE:2 in the last 168 hours No results found for this basename: AMMONIA:2 in the last 168 hours CBC:  Lab 04/25/11 0559 04/24/11 0441 04/23/11 1306  WBC 11.2* 6.7 --  NEUTROABS -- -- 8.9*  HGB 11.3* 10.7* --  HCT 35.3* 33.6* --  MCV 95.7 96.0 --  PLT 291 266 --   Cardiac Enzymes:  Lab 04/24/11 0731 04/23/11 2318 04/23/11 1524  CKTOTAL 31 39 47  CKMB 1.6 2.1 3.0  CKMBINDEX -- -- --  TROPONINI <0.30 <0.30 <0.30   BNP: No results found for this basename: PROBNP:3 in the last 168 hours D-Dimer: No results found for this basename: DDIMER:2 in the last 168 hours CBG:  Lab 04/25/11 0756 04/24/11 2145 04/24/11 1621 04/24/11 1129 04/24/11 0432 04/24/11 0017  GLUCAP 126* 145* 124* 166* 139* 136*   Hemoglobin A1C:  Lab 04/23/11  1524  HGBA1C 5.9*   Fasting Lipid Panel: No results found for this basename: CHOL,HDL,LDLCALC,TRIG,CHOLHDL,LDLDIRECT in the last 960 hours Thyroid Function Tests:  Lab 04/23/11 1524  TSH 0.363  T4TOTAL --  FREET4 1.09  T3FREE --  THYROIDAB --   Coagulation: No results found for this basename: LABPROT:4,INR:4 in the last 168 hours Anemia Panel: No results found for this basename: VITAMINB12,FOLATE,FERRITIN,TIBC,IRON,RETICCTPCT in the last 168 hours Urine Drug Screen: Drugs of Abuse     Component Value Date/Time   LABOPIA NONE DETECTED 04/23/2011 1259   COCAINSCRNUR NONE DETECTED 04/23/2011 1259   LABBENZ NONE DETECTED 04/23/2011 1259   AMPHETMU NONE DETECTED 04/23/2011 1259   THCU NONE DETECTED 04/23/2011 1259   LABBARB NONE DETECTED 04/23/2011 1259    Alcohol Level: No results found for this basename: ETH:2 in the last 168 hours Urinalysis:  Lab 04/23/11 1259  COLORURINE YELLOW  LABSPEC >1.030*  PHURINE 6.0  GLUCOSEU NEGATIVE  HGBUR SMALL*  BILIRUBINUR NEGATIVE  KETONESUR NEGATIVE  PROTEINUR 30*  UROBILINOGEN 0.2  NITRITE NEGATIVE  LEUKOCYTESUR NEGATIVE   Micro Results: Recent Results (from the past 240 hour(s))  CULTURE, BLOOD (ROUTINE X 2)     Status: Normal (Preliminary result)  Collection Time   04/23/11  1:00 PM      Component Value Range Status Comment   Specimen Description BLOOD RIGHT ANTECUBITAL   Final    Special Requests BOTTLES DRAWN AEROBIC AND ANAEROBIC 7CC   Final    Culture NO GROWTH 1 DAY   Final    Report Status PENDING   Incomplete   CULTURE, BLOOD (ROUTINE X 2)     Status: Normal (Preliminary result)   Collection Time   04/23/11  1:05 PM      Component Value Range Status Comment   Specimen Description BLOOD RIGHT HAND DRAWN BY RN   Final    Special Requests BOTTLES DRAWN AEROBIC AND ANAEROBIC 6CC   Final    Culture NO GROWTH 1 DAY   Final    Report Status PENDING   Incomplete   URINE CULTURE     Status: Normal   Collection Time   04/23/11   5:17 PM      Component Value Range Status Comment   Specimen Description URINE, CLEAN CATCH   Final    Special Requests Normal   Final    Culture  Setup Time [id number]   Final    Colony Count NO GROWTH   Final    Culture NO GROWTH   Final    Report Status 04/24/2011 FINAL   Final   CULTURE, RESPIRATORY     Status: Normal (Preliminary result)   Collection Time   04/23/11  5:17 PM      Component Value Range Status Comment   Specimen Description TRACHEAL ASPIRATE   Final    Special Requests NONE   Final    Gram Stain     Final    Value: FEW WBC PRESENT,BOTH PMN AND MONONUCLEAR     NO SQUAMOUS EPITHELIAL CELLS SEEN     NO ORGANISMS SEEN   Culture Culture reincubated for better growth   Final    Report Status PENDING   Incomplete   MRSA PCR SCREENING     Status: Normal   Collection Time   04/23/11  7:22 PM      Component Value Range Status Comment   MRSA by PCR NEGATIVE  NEGATIVE  Final    Studies/Results: Ct Head Wo Contrast  04/23/2011  *RADIOLOGY REPORT*  Clinical Data: Shortness of breath, unresponsive  CT HEAD WITHOUT CONTRAST  Technique:  Contiguous axial images were obtained from the base of the skull through the vertex without contrast.  Comparison: None.  Findings: No evidence of parenchymal hemorrhage or extra-axial fluid collection. No mass lesion, mass effect, or midline shift.  No CT evidence of acute infarction.  Subcortical white matter and periventricular small vessel ischemic changes, including in the subcortical left frontal lobe (series 2/image 21).  Intracranial atherosclerosis.  Cerebral volume is age appropriate.  No ventriculomegaly.  The visualized paranasal sinuses are essentially clear. The mastoid air cells are unopacified.  No evidence of calvarial fracture.  IMPRESSION: No evidence of acute intracranial abnormality.  Small vessel ischemic changes with intracranial atherosclerosis.  Original Report Authenticated By: Charline Bills, M.D.   Dg Chest Port 1  View  04/25/2011  *RADIOLOGY REPORT*  Clinical Data: Respiratory failure  PORTABLE CHEST - 1 VIEW  Comparison: 04/23/2011  Findings: Cardiomediastinal silhouette is stable.  The lung apices are not included on the film.  Endotracheal tube in place with tip 3.3 cm above the carina.  Persistent hazy airspace disease/infiltrate left base.  No convincing pulmonary edema.  IMPRESSION:  Endotracheal tube in place with tip 3.3 cm above the carina. Persistent hazy airspace disease/infiltrate left base.  No convincing pulmonary edema.  Original Report Authenticated By: Natasha Mead, M.D.   Dg Chest Portable 1 View  04/23/2011  *RADIOLOGY REPORT*  Clinical Data: Shortness of breath.  ET tube placement.  PORTABLE CHEST - 1 VIEW  Comparison: 01/31/2010  Findings: Endotracheal tube is 9 cm above the carina.  Severe COPD changes.  Lung bases are incompletely imaged.  Concern for consolidation in the left lung base.  Cannot assess for effusions. Heart is normal size.  IMPRESSION: Endotracheal tube 9 cm above the carina.  Limited evaluation of the lung bases.  Suspect left basilar consolidation/pneumonia.  Original Report Authenticated By: Cyndie Chime, M.D.   Dg Chest Port 1v Same Day  04/23/2011  *RADIOLOGY REPORT*  Clinical Data: Shortness of breath.  PORTABLE CHEST - 1 VIEW SAME DAY  Comparison: Earlier the same day  Findings: 1417 hours.  Endotracheal tube tip is 6.8 cm above the base of the carina. Hyperexpansion is consistent with emphysema. Interstitial markings are diffusely coarsened with chronic features.  There is biapical pleuroparenchymal density, as before. The airspace opacity the left base is again noted. Telemetry leads overlie the chest.  IMPRESSION: Airspace disease at the left base is suspicious for pneumonia.  Biapical pleuroparenchymal changes may be related to scarring. Follow-up is recommended to further evaluate.  Original Report Authenticated By: ERIC A. MANSELL, M.D.   Scheduled Meds:   .  ipratropium  0.5 mg Nebulization QID   And  . albuterol  2.5 mg Nebulization Q4H  . antiseptic oral rinse  15 mL Mouth Rinse QID  . azithromycin  500 mg Intravenous Q24H  . ceFTAROline (TEFLARO) IV  600 mg Intravenous Q12H  . chlorhexidine  15 mL Mouth Rinse BID  . enoxaparin  40 mg Subcutaneous Q24H  . insulin aspart  0-20 Units Subcutaneous TID WC  . insulin aspart  0-5 Units Subcutaneous QHS  . methylPREDNISolone (SOLU-MEDROL) injection  80 mg Intravenous Q12H  . pantoprazole (PROTONIX) IV  40 mg Intravenous QHS  . DISCONTD: albuterol  2.5 mg Nebulization Q4H  . DISCONTD: insulin glargine  12 Units Subcutaneous BID  . DISCONTD: ipratropium  0.5 mg Nebulization Q4H  . DISCONTD: methylPREDNISolone (SOLU-MEDROL) injection  80 mg Intravenous Q8H  . DISCONTD: vancomycin  1,000 mg Intravenous Q12H   Continuous Infusions:   . dextrose 5 % and 0.9 % NaCl with KCl 20 mEq/L 100 mL/hr at 04/25/11 0800  . DISCONTD: propofol Stopped (04/25/11 0715)   PRN Meds:.sodium chloride, ALPRAZolam, fentaNYL, midazolam Assessment/Plan: Principal Problem:  *Acute respiratory failure Active Problems:  Benign hypertension  COPD with exacerbation  Community acquired pneumonia  Sinus tachycardia  Tobacco abuse  Hyperglycemia  Dehydration  Anemia  Start a diet. Increase activity. Continue ceftaroline. Discontinue vancomycin. Add azithromycin for atypical coverage. Change Solu-Medrol to 80 mg twice daily. Continue bronchodilators. DVT prophylaxis. Discontinue Lantus. Continue sliding scale NovoLog. Change to step down status.    LOS: 2 days   Jerrit Horen L 04/25/2011, 9:05 AM

## 2011-04-25 NOTE — Progress Notes (Signed)
Patient requesting trazodone, pt states he takes the med at home at night; paged MD, will continue to monitor patient

## 2011-04-25 NOTE — Progress Notes (Signed)
Utilization review completed.  

## 2011-04-25 NOTE — Progress Notes (Signed)
Subjective: He looks much better this morning. He is currently weaning. He failed weaning yesterday. He still has secretions but he has an excellent cough effort and is able to clear secretions through the endotracheal tube. He motions that he thinks he is breathing better  Objective: Vital signs in last 24 hours: Temp:  [97.4 F (36.3 C)-98.6 F (37 C)] 97.8 F (36.6 C) (03/13 0400) Pulse Rate:  [67-90] 81  (03/13 0700) Resp:  [12-25] 12  (03/13 0700) BP: (116-160)/(59-92) 160/80 mmHg (03/13 0700) SpO2:  [95 %-100 %] 99 % (03/13 0717) FiO2 (%):  [0.4 %-35 %] 35 % (03/13 0717) Weight:  [76.1 kg (167 lb 12.3 oz)] 76.1 kg (167 lb 12.3 oz) (03/13 0500) Weight change: 3.524 kg (7 lb 12.3 oz) Last BM Date: 04/22/11  Intake/Output from previous day: 03/12 0701 - 03/13 0700 In: 3305.9 [I.V.:2405.9; IV Piggyback:900] Out: 1050 [Urine:1050]  PHYSICAL EXAM General appearance: alert, cooperative and no distress Resp: rhonchi bilaterally Cardio: regular rate and rhythm, S1, S2 normal, no murmur, click, rub or gallop GI: soft, non-tender; bowel sounds normal; no masses,  no organomegaly Extremities: extremities normal, atraumatic, no cyanosis or edema  Lab Results:    Basic Metabolic Panel:  Basename 04/25/11 0559 04/24/11 0441  NA 142 139  K 4.8 4.3  CL 107 102  CO2 32 33*  GLUCOSE 164* 152*  BUN 36* 39*  CREATININE 0.70 0.70  CALCIUM 8.6 8.6  MG -- 2.9*  PHOS -- --   Liver Function Tests:  Basename 04/23/11 1306  AST 20  ALT 15  ALKPHOS 93  BILITOT 0.2*  PROT 6.4  ALBUMIN 2.9*   No results found for this basename: LIPASE:2,AMYLASE:2 in the last 72 hours No results found for this basename: AMMONIA:2 in the last 72 hours CBC:  Basename 04/25/11 0559 04/24/11 0441 04/23/11 1306  WBC 11.2* 6.7 --  NEUTROABS -- -- 8.9*  HGB 11.3* 10.7* --  HCT 35.3* 33.6* --  MCV 95.7 96.0 --  PLT 291 266 --   Cardiac Enzymes:  Basename 04/24/11 0731 04/23/11 2318 04/23/11 1524   CKTOTAL 31 39 47  CKMB 1.6 2.1 3.0  CKMBINDEX -- -- --  TROPONINI <0.30 <0.30 <0.30   BNP: No results found for this basename: PROBNP:3 in the last 72 hours D-Dimer: No results found for this basename: DDIMER:2 in the last 72 hours CBG:  Basename 04/24/11 2145 04/24/11 1621 04/24/11 1129 04/24/11 0432 04/24/11 0017 04/23/11 2121  GLUCAP 145* 124* 166* 139* 136* 121*   Hemoglobin A1C:  Basename 04/23/11 1524  HGBA1C 5.9*   Fasting Lipid Panel: No results found for this basename: CHOL,HDL,LDLCALC,TRIG,CHOLHDL,LDLDIRECT in the last 72 hours Thyroid Function Tests:  Basename 04/23/11 1524  TSH 0.363  T4TOTAL --  FREET4 1.09  T3FREE --  THYROIDAB --   Anemia Panel: No results found for this basename: VITAMINB12,FOLATE,FERRITIN,TIBC,IRON,RETICCTPCT in the last 72 hours Coagulation: No results found for this basename: LABPROT:2,INR:2 in the last 72 hours Urine Drug Screen: Drugs of Abuse     Component Value Date/Time   LABOPIA NONE DETECTED 04/23/2011 1259   COCAINSCRNUR NONE DETECTED 04/23/2011 1259   LABBENZ NONE DETECTED 04/23/2011 1259   AMPHETMU NONE DETECTED 04/23/2011 1259   THCU NONE DETECTED 04/23/2011 1259   LABBARB NONE DETECTED 04/23/2011 1259    Alcohol Level: No results found for this basename: ETH:2 in the last 72 hours Urinalysis:  Basename 04/23/11 1259  COLORURINE YELLOW  LABSPEC >1.030*  PHURINE 6.0  GLUCOSEU NEGATIVE  HGBUR SMALL*  BILIRUBINUR NEGATIVE  KETONESUR NEGATIVE  PROTEINUR 30*  UROBILINOGEN 0.2  NITRITE NEGATIVE  LEUKOCYTESUR NEGATIVE   Misc. Labs:  ABGS  Basename 04/25/11 0510  PHART 7.401  PO2ART 102.0*  TCO2 29.9  HCO3 32.5*   CULTURES Recent Results (from the past 240 hour(s))  CULTURE, BLOOD (ROUTINE X 2)     Status: Normal (Preliminary result)   Collection Time   04/23/11  1:00 PM      Component Value Range Status Comment   Specimen Description BLOOD RIGHT ANTECUBITAL   Final    Special Requests BOTTLES DRAWN  AEROBIC AND ANAEROBIC 7CC   Final    Culture NO GROWTH 1 DAY   Final    Report Status PENDING   Incomplete   CULTURE, BLOOD (ROUTINE X 2)     Status: Normal (Preliminary result)   Collection Time   04/23/11  1:05 PM      Component Value Range Status Comment   Specimen Description BLOOD RIGHT HAND DRAWN BY RN   Final    Special Requests BOTTLES DRAWN AEROBIC AND ANAEROBIC 6CC   Final    Culture NO GROWTH 1 DAY   Final    Report Status PENDING   Incomplete   URINE CULTURE     Status: Normal   Collection Time   04/23/11  5:17 PM      Component Value Range Status Comment   Specimen Description URINE, CLEAN CATCH   Final    Special Requests Normal   Final    Culture  Setup Time [id number]   Final    Colony Count NO GROWTH   Final    Culture NO GROWTH   Final    Report Status 04/24/2011 FINAL   Final   CULTURE, RESPIRATORY     Status: Normal (Preliminary result)   Collection Time   04/23/11  5:17 PM      Component Value Range Status Comment   Specimen Description TRACHEAL ASPIRATE   Final    Special Requests NONE   Final    Gram Stain     Final    Value: FEW WBC PRESENT,BOTH PMN AND MONONUCLEAR     NO SQUAMOUS EPITHELIAL CELLS SEEN     NO ORGANISMS SEEN   Culture NO GROWTH   Final    Report Status PENDING   Incomplete   MRSA PCR SCREENING     Status: Normal   Collection Time   04/23/11  7:22 PM      Component Value Range Status Comment   MRSA by PCR NEGATIVE  NEGATIVE  Final    Studies/Results: Ct Head Wo Contrast  04/23/2011  *RADIOLOGY REPORT*  Clinical Data: Shortness of breath, unresponsive  CT HEAD WITHOUT CONTRAST  Technique:  Contiguous axial images were obtained from the base of the skull through the vertex without contrast.  Comparison: None.  Findings: No evidence of parenchymal hemorrhage or extra-axial fluid collection. No mass lesion, mass effect, or midline shift.  No CT evidence of acute infarction.  Subcortical white matter and periventricular small vessel ischemic  changes, including in the subcortical left frontal lobe (series 2/image 21).  Intracranial atherosclerosis.  Cerebral volume is age appropriate.  No ventriculomegaly.  The visualized paranasal sinuses are essentially clear. The mastoid air cells are unopacified.  No evidence of calvarial fracture.  IMPRESSION: No evidence of acute intracranial abnormality.  Small vessel ischemic changes with intracranial atherosclerosis.  Original Report Authenticated By: Charline Bills, M.D.   Dg Chest  Portable 1 View  04/23/2011  *RADIOLOGY REPORT*  Clinical Data: Shortness of breath.  ET tube placement.  PORTABLE CHEST - 1 VIEW  Comparison: 01/31/2010  Findings: Endotracheal tube is 9 cm above the carina.  Severe COPD changes.  Lung bases are incompletely imaged.  Concern for consolidation in the left lung base.  Cannot assess for effusions. Heart is normal size.  IMPRESSION: Endotracheal tube 9 cm above the carina.  Limited evaluation of the lung bases.  Suspect left basilar consolidation/pneumonia.  Original Report Authenticated By: Cyndie Chime, M.D.   Dg Chest Port 1v Same Day  04/23/2011  *RADIOLOGY REPORT*  Clinical Data: Shortness of breath.  PORTABLE CHEST - 1 VIEW SAME DAY  Comparison: Earlier the same day  Findings: 1417 hours.  Endotracheal tube tip is 6.8 cm above the base of the carina. Hyperexpansion is consistent with emphysema. Interstitial markings are diffusely coarsened with chronic features.  There is biapical pleuroparenchymal density, as before. The airspace opacity the left base is again noted. Telemetry leads overlie the chest.  IMPRESSION: Airspace disease at the left base is suspicious for pneumonia.  Biapical pleuroparenchymal changes may be related to scarring. Follow-up is recommended to further evaluate.  Original Report Authenticated By: ERIC A. MANSELL, M.D.    Medications:  Scheduled:   . albuterol  2.5 mg Nebulization Q4H   And  . ipratropium  0.5 mg Nebulization Q4H  .  antiseptic oral rinse  15 mL Mouth Rinse QID  . ceFTAROline (TEFLARO) IV  600 mg Intravenous Q12H  . chlorhexidine  15 mL Mouth Rinse BID  . enoxaparin  40 mg Subcutaneous Q24H  . insulin aspart  0-20 Units Subcutaneous TID WC  . insulin aspart  0-5 Units Subcutaneous QHS  . insulin glargine  12 Units Subcutaneous BID  . methylPREDNISolone (SOLU-MEDROL) injection  80 mg Intravenous Q8H  . pantoprazole (PROTONIX) IV  40 mg Intravenous QHS  . vancomycin  1,000 mg Intravenous Q12H  . DISCONTD: methylPREDNISolone (SOLU-MEDROL) injection  80 mg Intravenous Q6H   Continuous:   . dextrose 5 % and 0.9 % NaCl with KCl 20 mEq/L 100 mL/hr at 04/25/11 0630  . propofol 15 mcg/kg/min (04/25/11 0630)  . DISCONTD: 0.9 % NaCl with KCl 20 mEq / L 125 mL/hr at 04/24/11 0600   ZOX:WRUEAV chloride, fentaNYL, midazolam  Assesment: He has acute respiratory failure. Today he looks like he probably able to be extubated. He does have COPD and community acquired pneumonia. The only concern about his extubation ounces secretions but he is able to clear them through the endotracheal tube so he'll probably do better once the tube is out Principal Problem:  *Acute respiratory failure Active Problems:  COPD with exacerbation  Community acquired pneumonia  Sinus tachycardia  Tobacco abuse  Hyperglycemia  Dehydration  Anemia    Plan: No change in treatments as far as his pneumonia Melvern Banker. I will see if he can be extubated.    LOS: 2 days   Najai Waszak L 04/25/2011, 7:54 AM

## 2011-04-26 LAB — CULTURE, RESPIRATORY W GRAM STAIN

## 2011-04-26 LAB — GLUCOSE, CAPILLARY

## 2011-04-26 MED ORDER — IPRATROPIUM BROMIDE 0.02 % IN SOLN
0.5000 mg | Freq: Four times a day (QID) | RESPIRATORY_TRACT | Status: DC
  Administered 2011-04-26 – 2011-04-27 (×5): 0.5 mg via RESPIRATORY_TRACT
  Filled 2011-04-26 (×7): qty 2.5

## 2011-04-26 MED ORDER — AZITHROMYCIN 250 MG PO TABS
250.0000 mg | ORAL_TABLET | Freq: Every day | ORAL | Status: DC
  Administered 2011-04-26 – 2011-04-27 (×2): 250 mg via ORAL
  Filled 2011-04-26 (×2): qty 1

## 2011-04-26 MED ORDER — AMLODIPINE BESYLATE 5 MG PO TABS
5.0000 mg | ORAL_TABLET | Freq: Every day | ORAL | Status: DC
  Administered 2011-04-26 – 2011-04-27 (×2): 5 mg via ORAL
  Filled 2011-04-26 (×2): qty 1

## 2011-04-26 MED ORDER — PREDNISONE 20 MG PO TABS
40.0000 mg | ORAL_TABLET | Freq: Two times a day (BID) | ORAL | Status: DC
  Administered 2011-04-26 – 2011-04-27 (×3): 40 mg via ORAL
  Filled 2011-04-26 (×3): qty 2

## 2011-04-26 MED ORDER — NICOTINE 14 MG/24HR TD PT24
14.0000 mg | MEDICATED_PATCH | Freq: Every day | TRANSDERMAL | Status: DC
  Administered 2011-04-26 – 2011-04-27 (×2): 14 mg via TRANSDERMAL
  Filled 2011-04-26 (×2): qty 1

## 2011-04-26 MED ORDER — ALBUTEROL SULFATE (5 MG/ML) 0.5% IN NEBU
2.5000 mg | INHALATION_SOLUTION | Freq: Four times a day (QID) | RESPIRATORY_TRACT | Status: DC
  Administered 2011-04-26 – 2011-04-27 (×5): 2.5 mg via RESPIRATORY_TRACT
  Filled 2011-04-26 (×7): qty 0.5

## 2011-04-26 MED ORDER — CEFTAROLINE FOSAMIL 600 MG IV SOLR
INTRAVENOUS | Status: AC
  Filled 2011-04-26: qty 600

## 2011-04-26 MED ORDER — TRAZODONE HCL 50 MG PO TABS
50.0000 mg | ORAL_TABLET | Freq: Every day | ORAL | Status: DC
  Administered 2011-04-26: 50 mg via ORAL
  Filled 2011-04-26: qty 1

## 2011-04-26 NOTE — Progress Notes (Signed)
Subjective: He was able to be successfully extubated yesterday. He looks much better. He says he feels about like his baseline. He is still coughing some.  Objective: Vital signs in last 24 hours: Temp:  [97.8 F (36.6 C)-98.5 F (36.9 C)] 98.5 F (36.9 C) (03/14 0400) Pulse Rate:  [85-110] 89  (03/14 0600) Resp:  [14-24] 16  (03/14 0600) BP: (121-170)/(58-98) 162/75 mmHg (03/14 0600) SpO2:  [91 %-98 %] 97 % (03/14 0708) Weight:  [74 kg (163 lb 2.3 oz)] 74 kg (163 lb 2.3 oz) (03/14 0500) Weight change: -2.1 kg (-4 lb 10.1 oz) Last BM Date: 04/23/11  Intake/Output from previous day: 03/13 0701 - 03/14 0700 In: 3346.4 [P.O.:1980; I.V.:416.4; IV Piggyback:950] Out: 3325 [Urine:3325]  PHYSICAL EXAM General appearance: alert, cooperative and no distress Resp: rhonchi bilaterally Cardio: regular rate and rhythm, S1, S2 normal, no murmur, click, rub or gallop GI: soft, non-tender; bowel sounds normal; no masses,  no organomegaly Extremities: extremities normal, atraumatic, no cyanosis or edema  Lab Results:    Basic Metabolic Panel:  Basename 04/25/11 0559 04/24/11 0441  NA 142 139  K 4.8 4.3  CL 107 102  CO2 32 33*  GLUCOSE 164* 152*  BUN 36* 39*  CREATININE 0.70 0.70  CALCIUM 8.6 8.6  MG -- 2.9*  PHOS -- --   Liver Function Tests:  Basename 04/23/11 1306  AST 20  ALT 15  ALKPHOS 93  BILITOT 0.2*  PROT 6.4  ALBUMIN 2.9*   No results found for this basename: LIPASE:2,AMYLASE:2 in the last 72 hours No results found for this basename: AMMONIA:2 in the last 72 hours CBC:  Basename 04/25/11 0559 04/24/11 0441 04/23/11 1306  WBC 11.2* 6.7 --  NEUTROABS -- -- 8.9*  HGB 11.3* 10.7* --  HCT 35.3* 33.6* --  MCV 95.7 96.0 --  PLT 291 266 --   Cardiac Enzymes:  Basename 04/24/11 0731 04/23/11 2318 04/23/11 1524  CKTOTAL 31 39 47  CKMB 1.6 2.1 3.0  CKMBINDEX -- -- --  TROPONINI <0.30 <0.30 <0.30   BNP: No results found for this basename: PROBNP:3 in the last  72 hours D-Dimer: No results found for this basename: DDIMER:2 in the last 72 hours CBG:  Basename 04/26/11 0738 04/25/11 2142 04/25/11 1647 04/25/11 1150 04/25/11 0756 04/24/11 2145  GLUCAP 92 123* 122* 114* 126* 145*   Hemoglobin A1C:  Basename 04/23/11 1524  HGBA1C 5.9*   Fasting Lipid Panel: No results found for this basename: CHOL,HDL,LDLCALC,TRIG,CHOLHDL,LDLDIRECT in the last 72 hours Thyroid Function Tests:  Basename 04/23/11 1524  TSH 0.363  T4TOTAL --  FREET4 1.09  T3FREE --  THYROIDAB --   Anemia Panel:  Basename 04/25/11 0559  VITAMINB12 311  FOLATE 4.3  FERRITIN 247  TIBC 180*  IRON 51  RETICCTPCT --   Coagulation: No results found for this basename: LABPROT:2,INR:2 in the last 72 hours Urine Drug Screen: Drugs of Abuse     Component Value Date/Time   LABOPIA NONE DETECTED 04/23/2011 1259   COCAINSCRNUR NONE DETECTED 04/23/2011 1259   LABBENZ NONE DETECTED 04/23/2011 1259   AMPHETMU NONE DETECTED 04/23/2011 1259   THCU NONE DETECTED 04/23/2011 1259   LABBARB NONE DETECTED 04/23/2011 1259    Alcohol Level: No results found for this basename: ETH:2 in the last 72 hours Urinalysis:  Basename 04/23/11 1259  COLORURINE YELLOW  LABSPEC >1.030*  PHURINE 6.0  GLUCOSEU NEGATIVE  HGBUR SMALL*  BILIRUBINUR NEGATIVE  KETONESUR NEGATIVE  PROTEINUR 30*  UROBILINOGEN 0.2  NITRITE  NEGATIVE  LEUKOCYTESUR NEGATIVE   Misc. Labs:  ABGS  Basename 04/25/11 0510  PHART 7.401  PO2ART 102.0*  TCO2 29.9  HCO3 32.5*   CULTURES Recent Results (from the past 240 hour(s))  CULTURE, BLOOD (ROUTINE X 2)     Status: Normal (Preliminary result)   Collection Time   04/23/11  1:00 PM      Component Value Range Status Comment   Specimen Description BLOOD RIGHT ANTECUBITAL   Final    Special Requests BOTTLES DRAWN AEROBIC AND ANAEROBIC 7CC   Final    Culture NO GROWTH 2 DAYS   Final    Report Status PENDING   Incomplete   CULTURE, BLOOD (ROUTINE X 2)     Status:  Normal (Preliminary result)   Collection Time   04/23/11  1:05 PM      Component Value Range Status Comment   Specimen Description BLOOD RIGHT HAND DRAWN BY RN   Final    Special Requests BOTTLES DRAWN AEROBIC AND ANAEROBIC 6CC   Final    Culture NO GROWTH 2 DAYS   Final    Report Status PENDING   Incomplete   URINE CULTURE     Status: Normal   Collection Time   04/23/11  5:17 PM      Component Value Range Status Comment   Specimen Description URINE, CLEAN CATCH   Final    Special Requests Normal   Final    Culture  Setup Time [id number]   Final    Colony Count NO GROWTH   Final    Culture NO GROWTH   Final    Report Status 04/24/2011 FINAL   Final   CULTURE, RESPIRATORY     Status: Normal (Preliminary result)   Collection Time   04/23/11  5:17 PM      Component Value Range Status Comment   Specimen Description TRACHEAL ASPIRATE   Final    Special Requests NONE   Final    Gram Stain     Final    Value: FEW WBC PRESENT,BOTH PMN AND MONONUCLEAR     NO SQUAMOUS EPITHELIAL CELLS SEEN     NO ORGANISMS SEEN   Culture Culture reincubated for better growth   Final    Report Status PENDING   Incomplete   MRSA PCR SCREENING     Status: Normal   Collection Time   04/23/11  7:22 PM      Component Value Range Status Comment   MRSA by PCR NEGATIVE  NEGATIVE  Final    Studies/Results: Dg Chest Port 1 View  04/25/2011  *RADIOLOGY REPORT*  Clinical Data: Respiratory failure  PORTABLE CHEST - 1 VIEW  Comparison: 04/23/2011  Findings: Cardiomediastinal silhouette is stable.  The lung apices are not included on the film.  Endotracheal tube in place with tip 3.3 cm above the carina.  Persistent hazy airspace disease/infiltrate left base.  No convincing pulmonary edema.  IMPRESSION:  Endotracheal tube in place with tip 3.3 cm above the carina. Persistent hazy airspace disease/infiltrate left base.  No convincing pulmonary edema.  Original Report Authenticated By: Natasha Mead, M.D.    Medications:    Scheduled:   . albuterol  2.5 mg Nebulization QID   And  . ipratropium  0.5 mg Nebulization QID  . amLODipine  5 mg Oral Daily  . azithromycin  250 mg Oral Daily  . ceFTAROline (TEFLARO) IV  600 mg Intravenous Q12H  . enoxaparin  40 mg Subcutaneous Q24H  . predniSONE  40 mg Oral BID  . traZODone  50 mg Oral QHS  . DISCONTD: albuterol  2.5 mg Nebulization Q4H  . DISCONTD: albuterol  2.5 mg Nebulization Q4H  . DISCONTD: antiseptic oral rinse  15 mL Mouth Rinse QID  . DISCONTD: azithromycin  500 mg Intravenous Q24H  . DISCONTD: chlorhexidine  15 mL Mouth Rinse BID  . DISCONTD: insulin aspart  0-20 Units Subcutaneous TID WC  . DISCONTD: insulin aspart  0-5 Units Subcutaneous QHS  . DISCONTD: insulin glargine  12 Units Subcutaneous BID  . DISCONTD: ipratropium  0.5 mg Nebulization Q4H  . DISCONTD: ipratropium  0.5 mg Nebulization QID  . DISCONTD: methylPREDNISolone (SOLU-MEDROL) injection  80 mg Intravenous Q8H  . DISCONTD: methylPREDNISolone (SOLU-MEDROL) injection  80 mg Intravenous Q12H  . DISCONTD: pantoprazole (PROTONIX) IV  40 mg Intravenous QHS  . DISCONTD: vancomycin  1,000 mg Intravenous Q12H   Continuous:   . DISCONTD: dextrose 5 % and 0.9 % NaCl with KCl 20 mEq/L 10 mL/hr at 04/26/11 0600  . DISCONTD: propofol Stopped (04/25/11 0715)   JXB:[id number], DISCONTD: sodium chloride, DISCONTD: fentaNYL, DISCONTD: midazolam  Assesment: He was admitted with acute respiratory failure on the basis of pneumonia and COPD. He is much better this morning. Principal Problem:  *Acute respiratory failure Active Problems:  COPD with exacerbation  Community acquired pneumonia  Sinus tachycardia  Tobacco abuse  Hyperglycemia  Dehydration  Anemia  Benign hypertension    Plan: I discussed his situation with Dr. Lendell Caprice hospitalist attending and the plan is to go ahead and get him transferred from the ICU.    LOS: 3 days   Joaquina Nissen L 04/26/2011, 8:08 AM

## 2011-04-26 NOTE — Progress Notes (Signed)
Report called to Charna Busman, RN.  Pt ambulating in halls without difficulty.  Steady on feet and moving without assistance.  Remains with O2 off.  No acute distress noted.  Ready for transfer to room 321.

## 2011-04-26 NOTE — Progress Notes (Signed)
Subjective: Minimal cough. No shortness of breath. Complains of insomnia.  Objective: Vital signs in last 24 hours: Filed Vitals:   04/26/11 0410 04/26/11 0500 04/26/11 0600 04/26/11 0708  BP:  159/88 162/75   Pulse: 93 89 89   Temp:      TempSrc:      Resp: 17 16 16    Height:      Weight:  74 kg (163 lb 2.3 oz)    SpO2: 94% 93% 94% 97%   Weight change: -2.1 kg (-4 lb 10.1 oz)  Intake/Output Summary (Last 24 hours) at 04/26/11 0800 Last data filed at 04/26/11 0600  Gross per 24 hour  Intake 2981.5 ml  Output   3325 ml  Net -343.5 ml   Physical Exam: General: Alert. Appropriate. Lungs: Bronchial breath sounds bilaterally. No wheezes or rhonchi. Breathing nonlabored. Good air movement. Cardiovascular regular rate rhythm without murmurs gallops rubs Abdomen soft nontender nondistended Extremities no clubbing cyanosis or edema  Lab Results: Basic Metabolic Panel:  Lab 04/25/11 1610 04/24/11 0441  NA 142 139  K 4.8 4.3  CL 107 102  CO2 32 33*  GLUCOSE 164* 152*  BUN 36* 39*  CREATININE 0.70 0.70  CALCIUM 8.6 8.6  MG -- 2.9*  PHOS -- --   Liver Function Tests:  Lab 04/23/11 1306  AST 20  ALT 15  ALKPHOS 93  BILITOT 0.2*  PROT 6.4  ALBUMIN 2.9*   No results found for this basename: LIPASE:2,AMYLASE:2 in the last 168 hours No results found for this basename: AMMONIA:2 in the last 168 hours CBC:  Lab 04/25/11 0559 04/24/11 0441 04/23/11 1306  WBC 11.2* 6.7 --  NEUTROABS -- -- 8.9*  HGB 11.3* 10.7* --  HCT 35.3* 33.6* --  MCV 95.7 96.0 --  PLT 291 266 --   Cardiac Enzymes:  Lab 04/24/11 0731 04/23/11 2318 04/23/11 1524  CKTOTAL 31 39 47  CKMB 1.6 2.1 3.0  CKMBINDEX -- -- --  TROPONINI <0.30 <0.30 <0.30   BNP: No results found for this basename: PROBNP:3 in the last 168 hours D-Dimer: No results found for this basename: DDIMER:2 in the last 168 hours CBG:  Lab 04/26/11 0738 04/25/11 2142 04/25/11 1647 04/25/11 1150 04/25/11 0756 04/24/11  2145  GLUCAP 92 123* 122* 114* 126* 145*   Hemoglobin A1C:  Lab 04/23/11 1524  HGBA1C 5.9*   Fasting Lipid Panel: No results found for this basename: CHOL,HDL,LDLCALC,TRIG,CHOLHDL,LDLDIRECT in the last 960 hours Thyroid Function Tests:  Lab 04/23/11 1524  TSH 0.363  T4TOTAL --  FREET4 1.09  T3FREE --  THYROIDAB --   Coagulation: No results found for this basename: LABPROT:4,INR:4 in the last 168 hours Anemia Panel:  Lab 04/25/11 0559  VITAMINB12 311  FOLATE 4.3  FERRITIN 247  TIBC 180*  IRON 51  RETICCTPCT --   Urine Drug Screen: Drugs of Abuse     Component Value Date/Time   LABOPIA NONE DETECTED 04/23/2011 1259   COCAINSCRNUR NONE DETECTED 04/23/2011 1259   LABBENZ NONE DETECTED 04/23/2011 1259   AMPHETMU NONE DETECTED 04/23/2011 1259   THCU NONE DETECTED 04/23/2011 1259   LABBARB NONE DETECTED 04/23/2011 1259    Alcohol Level: No results found for this basename: ETH:2 in the last 168 hours Urinalysis:  Lab 04/23/11 1259  COLORURINE YELLOW  LABSPEC >1.030*  PHURINE 6.0  GLUCOSEU NEGATIVE  HGBUR SMALL*  BILIRUBINUR NEGATIVE  KETONESUR NEGATIVE  PROTEINUR 30*  UROBILINOGEN 0.2  NITRITE NEGATIVE  LEUKOCYTESUR NEGATIVE   Micro Results:  Recent Results (from the past 240 hour(s))  CULTURE, BLOOD (ROUTINE X 2)     Status: Normal (Preliminary result)   Collection Time   04/23/11  1:00 PM      Component Value Range Status Comment   Specimen Description BLOOD RIGHT ANTECUBITAL   Final    Special Requests BOTTLES DRAWN AEROBIC AND ANAEROBIC 7CC   Final    Culture NO GROWTH 2 DAYS   Final    Report Status PENDING   Incomplete   CULTURE, BLOOD (ROUTINE X 2)     Status: Normal (Preliminary result)   Collection Time   04/23/11  1:05 PM      Component Value Range Status Comment   Specimen Description BLOOD RIGHT HAND DRAWN BY RN   Final    Special Requests BOTTLES DRAWN AEROBIC AND ANAEROBIC 6CC   Final    Culture NO GROWTH 2 DAYS   Final    Report Status PENDING    Incomplete   URINE CULTURE     Status: Normal   Collection Time   04/23/11  5:17 PM      Component Value Range Status Comment   Specimen Description URINE, CLEAN CATCH   Final    Special Requests Normal   Final    Culture  Setup Time [id number]   Final    Colony Count NO GROWTH   Final    Culture NO GROWTH   Final    Report Status 04/24/2011 FINAL   Final   CULTURE, RESPIRATORY     Status: Normal (Preliminary result)   Collection Time   04/23/11  5:17 PM      Component Value Range Status Comment   Specimen Description TRACHEAL ASPIRATE   Final    Special Requests NONE   Final    Gram Stain     Final    Value: FEW WBC PRESENT,BOTH PMN AND MONONUCLEAR     NO SQUAMOUS EPITHELIAL CELLS SEEN     NO ORGANISMS SEEN   Culture Culture reincubated for better growth   Final    Report Status PENDING   Incomplete   MRSA PCR SCREENING     Status: Normal   Collection Time   04/23/11  7:22 PM      Component Value Range Status Comment   MRSA by PCR NEGATIVE  NEGATIVE  Final    Studies/Results: Dg Chest Port 1 View  04/25/2011  *RADIOLOGY REPORT*  Clinical Data: Respiratory failure  PORTABLE CHEST - 1 VIEW  Comparison: 04/23/2011  Findings: Cardiomediastinal silhouette is stable.  The lung apices are not included on the film.  Endotracheal tube in place with tip 3.3 cm above the carina.  Persistent hazy airspace disease/infiltrate left base.  No convincing pulmonary edema.  IMPRESSION:  Endotracheal tube in place with tip 3.3 cm above the carina. Persistent hazy airspace disease/infiltrate left base.  No convincing pulmonary edema.  Original Report Authenticated By: Natasha Mead, M.D.   Scheduled Meds:    . ipratropium  0.5 mg Nebulization QID   And  . albuterol  2.5 mg Nebulization Q4H  . azithromycin  250 mg Oral Daily  . ceFTAROline (TEFLARO) IV  600 mg Intravenous Q12H  . enoxaparin  40 mg Subcutaneous Q24H  . predniSONE  40 mg Oral BID  . traZODone  50 mg Oral QHS  . DISCONTD: albuterol   2.5 mg Nebulization Q4H  . DISCONTD: antiseptic oral rinse  15 mL Mouth Rinse QID  . DISCONTD: azithromycin  500  mg Intravenous Q24H  . DISCONTD: chlorhexidine  15 mL Mouth Rinse BID  . DISCONTD: insulin aspart  0-20 Units Subcutaneous TID WC  . DISCONTD: insulin aspart  0-5 Units Subcutaneous QHS  . DISCONTD: insulin glargine  12 Units Subcutaneous BID  . DISCONTD: ipratropium  0.5 mg Nebulization Q4H  . DISCONTD: methylPREDNISolone (SOLU-MEDROL) injection  80 mg Intravenous Q8H  . DISCONTD: methylPREDNISolone (SOLU-MEDROL) injection  80 mg Intravenous Q12H  . DISCONTD: pantoprazole (PROTONIX) IV  40 mg Intravenous QHS  . DISCONTD: vancomycin  1,000 mg Intravenous Q12H   Continuous Infusions:    . DISCONTD: dextrose 5 % and 0.9 % NaCl with KCl 20 mEq/L 10 mL/hr at 04/26/11 0600  . DISCONTD: propofol Stopped (04/25/11 0715)   PRN Meds:.ALPRAZolam, DISCONTD: sodium chloride, DISCONTD: fentaNYL, DISCONTD: midazolam Assessment/Plan: Principal Problem:  *Acute respiratory failure Active Problems:  Benign hypertension  COPD with exacerbation  Community acquired pneumonia  Sinus tachycardia  Tobacco abuse  Hyperglycemia  Dehydration  Anemia  Improving. Increase activity. Discontinue Foley. Transferred to the floor. Change azithromycin to by mouth. Continue ceftaroline. Change steroids to by mouth. Discontinue insulin and blood glucose monitoring. Resume trazodone. Resume antihypertensives.    LOS: 3 days   Nyeshia Mysliwiec L 04/26/2011, 8:00 AM

## 2011-04-26 NOTE — Progress Notes (Signed)
CARE MANAGEMENT NOTE 04/26/2011  Patient:  Shane Todd, Shane Todd   Account Number:  [id number]  Date Initiated:  04/26/2011  Documentation initiated by:  Rosemary Holms  Subjective/Objective Assessment:   Pt admitted from home with Acute respiratory distress, COPD and PNA     Action/Plan:   Spoke iwth Pt at bedside. He is ambulating without assistance. Possible HH RN to assess COPD necessary at dc but pt has not decided.   Anticipated DC Date:  04/27/2011   Anticipated DC Plan:  HOME/SELF CARE      DC Planning Services  CM consult      Choice offered to / List presented to:             Status of service:  In process, will continue to follow Medicare Important Message given?   (If response is "NO", the following Medicare IM given date fields will be blank) Date Medicare IM given:   Date Additional Medicare IM given:    Discharge Disposition:    Per UR Regulation:    If discussed at Long Length of Stay Meetings, dates discussed:    Comments:  04/26/11 1000 Anh Mangano Leanord Hawking RN BSN

## 2011-04-27 MED ORDER — CEFUROXIME AXETIL 500 MG PO TABS: 500.0000 mg | ORAL_TABLET | Freq: Two times a day (BID) | ORAL | Status: AC

## 2011-04-27 MED ORDER — PREDNISONE (PAK) 10 MG PO TABS: 10.0000 mg | ORAL_TABLET | Freq: Every day | ORAL | Status: AC

## 2011-04-27 NOTE — Progress Notes (Signed)
Ambulated patient in hall, tolerated well, patient denies short of breath, no s/s of distress noted

## 2011-04-27 NOTE — Progress Notes (Signed)
CARE MANAGEMENT NOTE 04/27/2011  Patient:  Shane Todd, Shane Todd   Account Number:  [id number]  Date Initiated:  04/26/2011  Documentation initiated by:  Rosemary Holms  Subjective/Objective Assessment:   Pt admitted from home with Acute respiratory distress, COPD and PNA     Action/Plan:   Spoke iwth Pt at bedside. He is ambulating without assistance. Possible HH RN to assess COPD necessary at dc but pt has not decided.   Anticipated DC Date:  04/27/2011   Anticipated DC Plan:  HOME/SELF CARE      DC Planning Services  CM consult      Choice offered to / List presented to:             Status of service:  Completed, signed off Medicare Important Message given?   (If response is "NO", the following Medicare IM given date fields will be blank) Date Medicare IM given:   Date Additional Medicare IM given:    Discharge Disposition:  HOME/SELF CARE  Per UR Regulation:    If discussed at Long Length of Stay Meetings, dates discussed:    Comments:  04/27/11 1100 Tristen Luce RN BSN Spoke with pt and spouse. Declines needs for [facility] services  04/26/11 1000 Zaylyn Bergdoll Leanord Hawking RN BSN

## 2011-04-27 NOTE — Progress Notes (Signed)
Patient discharged home with wife.  Instructed on new medications and how to take them.  Also instructed to quit smoking and given information on smoking cessation.  Patient states that this is something he will try to do at home. Follow up appt with PCP in place.  Patient or family has no questions at this time.  Verbalizes understanding of discharge instructions

## 2011-04-27 NOTE — Discharge Summary (Signed)
Physician Discharge Summary  Patient ID: Shane Todd MRN: [id number] DOB/AGE: [birth year] 66 y.o.  Admit date: 04/23/2011 Discharge date: 04/27/2011  Discharge Diagnoses:  Principal Problem:  *Acute respiratory failure Active Problems:  Benign hypertension  COPD with exacerbation  Community acquired pneumonia  Sinus tachycardia  Tobacco abuse  Hyperglycemia  Dehydration  Anemia   Medication List  As of 04/27/2011 12:01 PM   TAKE these medications         albuterol 108 (90 BASE) MCG/ACT inhaler   Commonly known as: PROVENTIL HFA;VENTOLIN HFA   Inhale 2 puffs into the lungs every 6 (six) hours as needed. For shortness of breath      ALPRAZolam 0.5 MG tablet   Commonly known as: XANAX   Take 0.5 mg by mouth at bedtime as needed. For sleep      amLODipine 5 MG tablet   Commonly known as: NORVASC   Take 5 mg by mouth daily.      cefUROXime 500 MG tablet   Commonly known as: CEFTIN   Take 1 tablet (500 mg total) by mouth 2 (two) times daily.      lisinopril 5 MG tablet   Commonly known as: PRINIVIL,ZESTRIL   Take 5 mg by mouth daily.      predniSONE 10 MG tablet   Commonly known as: STERAPRED UNI-PAK   Take 1 tablet (10 mg total) by mouth daily. 4 tablets daily then decrease by 1 tablet every 2 days until off      tiotropium 18 MCG inhalation capsule   Commonly known as: SPIRIVA   Place 18 mcg into inhaler and inhale daily as needed. For shortness of breath      traZODone 50 MG tablet   Commonly known as: DESYREL   Take 50 mg by mouth at bedtime.            Discharge Orders    Future Orders Please Complete By Expires   Diet - low sodium heart healthy      Increase activity slowly      Discharge instructions      Comments:   Quit smoking.      Follow-up Information    Follow up with Kirk Ruths, MD in 4 weeks.         Disposition: home  Discharged Condition: stable  Consults: Treatment Team:  Fredirick Maudlin, MD  Labs:   Results for  orders placed during the hospital encounter of 04/23/11 (from the past 48 hour(s))  GLUCOSE, CAPILLARY     Status: Abnormal   Collection Time   04/25/11  4:47 PM      Component Value Range Comment   Glucose-Capillary 122 (*) 70 - 99 (mg/dL)    Comment 1 Notify RN      Comment 2 Documented in Chart     GLUCOSE, CAPILLARY     Status: Abnormal   Collection Time   04/25/11  9:42 PM      Component Value Range Comment   Glucose-Capillary 123 (*) 70 - 99 (mg/dL)    Comment 1 Notify RN     GLUCOSE, CAPILLARY     Status: Normal   Collection Time   04/26/11  7:38 AM      Component Value Range Comment   Glucose-Capillary 92  70 - 99 (mg/dL)    Comment 1 Notify RN       Diagnostics:  Ct Head Wo Contrast  04/23/2011  *RADIOLOGY REPORT*  Clinical Data: Shortness of breath, unresponsive  CT HEAD WITHOUT CONTRAST  Technique:  Contiguous axial images were obtained from the base of the skull through the vertex without contrast.  Comparison: None.  Findings: No evidence of parenchymal hemorrhage or extra-axial fluid collection. No mass lesion, mass effect, or midline shift.  No CT evidence of acute infarction.  Subcortical white matter and periventricular small vessel ischemic changes, including in the subcortical left frontal lobe (series 2/image 21).  Intracranial atherosclerosis.  Cerebral volume is age appropriate.  No ventriculomegaly.  The visualized paranasal sinuses are essentially clear. The mastoid air cells are unopacified.  No evidence of calvarial fracture.  IMPRESSION: No evidence of acute intracranial abnormality.  Small vessel ischemic changes with intracranial atherosclerosis.  Original Report Authenticated By: Charline Bills, M.D.   Dg Chest Port 1 View  04/25/2011  *RADIOLOGY REPORT*  Clinical Data: Respiratory failure  PORTABLE CHEST - 1 VIEW  Comparison: 04/23/2011  Findings: Cardiomediastinal silhouette is stable.  The lung apices are not included on the film.  Endotracheal tube in place  with tip 3.3 cm above the carina.  Persistent hazy airspace disease/infiltrate left base.  No convincing pulmonary edema.  IMPRESSION:  Endotracheal tube in place with tip 3.3 cm above the carina. Persistent hazy airspace disease/infiltrate left base.  No convincing pulmonary edema.  Original Report Authenticated By: Natasha Mead, M.D.   Dg Chest Portable 1 View  04/23/2011  *RADIOLOGY REPORT*  Clinical Data: Shortness of breath.  ET tube placement.  PORTABLE CHEST - 1 VIEW  Comparison: 01/31/2010  Findings: Endotracheal tube is 9 cm above the carina.  Severe COPD changes.  Lung bases are incompletely imaged.  Concern for consolidation in the left lung base.  Cannot assess for effusions. Heart is normal size.  IMPRESSION: Endotracheal tube 9 cm above the carina.  Limited evaluation of the lung bases.  Suspect left basilar consolidation/pneumonia.  Original Report Authenticated By: Cyndie Chime, M.D.   Dg Chest Port 1v Same Day  04/23/2011  *RADIOLOGY REPORT*  Clinical Data: Shortness of breath.  PORTABLE CHEST - 1 VIEW SAME DAY  Comparison: Earlier the same day  Findings: 1417 hours.  Endotracheal tube tip is 6.8 cm above the base of the carina. Hyperexpansion is consistent with emphysema. Interstitial markings are diffusely coarsened with chronic features.  There is biapical pleuroparenchymal density, as before. The airspace opacity the left base is again noted. Telemetry leads overlie the chest.  IMPRESSION: Airspace disease at the left base is suspicious for pneumonia.  Biapical pleuroparenchymal changes may be related to scarring. Follow-up is recommended to further evaluate.  Original Report Authenticated By: ERIC A. MANSELL, M.D.    Procedures: Intubation mechanical ventilation  EKG: Normal sinus rhythm with biatrial enlargement  Full Code   Hospital Course: See H&P for complete admission details. Mr. Shane Todd is a pleasant 66 year old white male smoker who presented with shortness of breath. He  was hypoxic with oxygen saturations into the 80s. He was intubated in the emergency room and hospitalist was called for admission. Chest x-ray showed left basilar pneumonia. Patient was started on antibiotics for community-acquired pneumonia, steroids for COPD exacerbation and bronchodilators. Dr. Juanetta Gosling was consult to manage the vent. Patient improved and was able to be extubated on 3/13. At the time of discharge, he has no shortness of breath. Minimal cough. Off oxygen, clear lung sounds and able to and light without difficulty. He wishes to quit smoking. He will need a repeat chest x-ray in 4-6 weeks  Discharge Exam:  Blood pressure 164/75, pulse  76, temperature 97.7 F (36.5 C), temperature source Oral, resp. rate 20, height 6\' 1"  (1.854 m), weight 74 kg (163 lb 2.3 oz), SpO2 91.00%.   unchanged from 04/26/2011   Signed: Crista Curb L 04/27/2011, 12:01 PM

## 2011-04-27 NOTE — Progress Notes (Signed)
He says he feels much better. He has no new complaints. He is not short of breath. He is not coughing and has not coughed anything up. His physical examination shows he is awake and alert. His chest is almost totally clear with some rhonchi on the left. His heart is regular without gallop. He does not have any edema. I think he is approaching discharge and I will plan to sign off at this point. Thank you very much for allow me to see him with you

## 2011-04-27 NOTE — Discharge Instructions (Signed)
Chronic Obstructive Pulmonary Disease Chronic obstructive pulmonary disease (COPD) is a condition in which airflow from the lungs is restricted. The lungs can never return to normal, but there are measures you can take which will improve them and make you feel better. CAUSES   Smoking.   Exposure to secondhand smoke.   Breathing in irritants (pollution, cigarette smoke, strong smells, aerosol sprays, paint fumes).   History of lung infections.  TREATMENT  Treatment focuses on making you comfortable (supportive care). Your caregiver may prescribe medications (inhaled or pills) to help improve your breathing. HOME CARE INSTRUCTIONS   If you smoke, stop smoking.   Avoid exposure to smoke, chemicals, and fumes that aggravate your breathing.   Take antibiotic medicines as directed by your caregiver.   Avoid medicines that dry up your system and slow down the elimination of secretions (antihistamines and cough syrups). This decreases respiratory capacity and may lead to infections.   Drink enough water and fluids to keep your urine clear or pale yellow. This loosens secretions.   Use humidifiers at home and at your bedside if they do not make breathing difficult.   Receive all protective vaccines your caregiver suggests, especially pneumococcal and influenza.   Use home oxygen as suggested.   Stay active. Exercise and physical activity will help maintain your ability to do things you want to do.   Eat a healthy diet.  SEEK MEDICAL CARE IF:   You develop pus-like mucus (sputum).   Breathing is more labored or exercise becomes difficult to do.   You are running out of the medicine you take for your breathing.  SEEK IMMEDIATE MEDICAL CARE IF:   You have a rapid heart rate.   You have agitation, confusion, tremors, or are in a stupor (family members may need to observe this).   It becomes difficult to breathe.   You develop chest pain.   You have a fever.  MAKE SURE YOU:    Understand these instructions.   Will watch your condition.   Will get help right away if you are not doing well or get worse.  Document Released: 11/08/2004 Document Revised: 01/18/2011 Document Reviewed: 03/31/2010 ExitCare Patient Information 2012 ExitCare, [employer]neumonia, Adult Pneumonia is an infection of the lungs.  CAUSES Pneumonia may be caused by bacteria or a virus. Usually, these infections are caused by breathing infectious particles into the lungs (respiratory tract). SYMPTOMS   Cough.   Fever.   Chest pain.   Increased rate of breathing.   Wheezing.   Mucus production.  DIAGNOSIS  If you have the common symptoms of pneumonia, your caregiver will typically confirm the diagnosis with a chest X-ray. The X-ray will show an abnormality in the lung (pulmonary infiltrate) if you have pneumonia. Other tests of your blood, urine, or sputum may be done to find the specific cause of your pneumonia. Your caregiver may also do tests (blood gases or pulse oximetry) to see how well your lungs are working. TREATMENT  Some forms of pneumonia may be spread to other people when you cough or sneeze. You may be asked to wear a mask before and during your exam. Pneumonia that is caused by bacteria is treated with antibiotic medicine. Pneumonia that is caused by the influenza virus may be treated with an antiviral medicine. Most other viral infections must run their course. These infections will not respond to antibiotics.  PREVENTION A pneumococcal shot (vaccine) is available to prevent a common bacterial cause of pneumonia. This is usually  suggested for:  People over 65 years old.   Patients on chemotherapy.   People with chronic lung problems, such as bronchitis or emphysema.   People with immune system problems.  If you are over 65 or have a high risk condition, you may receive the pneumococcal vaccine if you have not received it before. In some countries, a routine influenza  vaccine is also recommended. This vaccine can help prevent some cases of pneumonia.You may be offered the influenza vaccine as part of your care. If you smoke, it is time to quit. You may receive instructions on how to stop smoking. Your caregiver can provide medicines and counseling to help you quit. HOME CARE INSTRUCTIONS   Cough suppressants may be used if you are losing too much rest. However, coughing protects you by clearing your lungs. You should avoid using cough suppressants if you can.   Your caregiver may have prescribed medicine if he or she thinks your pneumonia is caused by a bacteria or influenza. Finish your medicine even if you start to feel better.   Your caregiver may also prescribe an expectorant. This loosens the mucus to be coughed up.   Only take over-the-counter or prescription medicines for pain, discomfort, or fever as directed by your caregiver.   Do not smoke. Smoking is a common cause of bronchitis and can contribute to pneumonia. If you are a smoker and continue to smoke, your cough may last several weeks after your pneumonia has cleared.   A cold steam vaporizer or humidifier in your room or home may help loosen mucus.   Coughing is often worse at night. Sleeping in a semi-upright position in a recliner or using a couple pillows under your head will help with this.   Get rest as you feel it is needed. Your body will usually let you know when you need to rest.  SEEK IMMEDIATE MEDICAL CARE IF:   Your illness becomes worse. This is especially true if you are elderly or weakened from any other disease.   You cannot control your cough with suppressants and are losing sleep.   You begin coughing up blood.   You develop pain which is getting worse or is uncontrolled with medicines.   You have a fever.   Any of the symptoms which initially brought you in for treatment are getting worse rather than better.   You develop shortness of breath or chest pain.  MAKE  SURE YOU:   Understand these instructions.   Will watch your condition.   Will get help right away if you are not doing well or get worse.  Document Released: 01/29/2005 Document Revised: 01/18/2011 Document Reviewed: 04/20/2010 ExitCare Patient Information 2012 ExitCare, LLCSmoking Cessation This document explains the best ways for you to quit smoking and new treatments to help. It lists new medicines that can double or triple your chances of quitting and quitting for good. It also considers ways to avoid relapses and concerns you may have about quitting, including weight gain. NICOTINE: A POWERFUL ADDICTION If you have tried to quit smoking, you know how hard it can be. It is hard because nicotine is a very addictive drug. For some people, it can be as addictive as heroin or cocaine. Usually, people make 2 or 3 tries, or more, before finally being able to quit. Each time you try to quit, you can learn about what helps and what hurts. Quitting takes hard work and a lot of effort, but you can quit smoking. QUITTING  SMOKING IS ONE OF THE MOST IMPORTANT THINGS YOU WILL EVER DO.  You will live longer, feel better, and live better.   The impact on your body of quitting smoking is felt almost immediately:   Within 20 minutes, blood pressure decreases. Pulse returns to its normal level.   After 8 hours, carbon monoxide levels in the blood return to normal. Oxygen level increases.   After 24 hours, chance of heart attack starts to decrease. Breath, hair, and body stop smelling like smoke.   After 48 hours, damaged nerve endings begin to recover. Sense of taste and smell improve.   After 72 hours, the body is virtually free of nicotine. Bronchial tubes relax and breathing becomes easier.   After 2 to 12 weeks, lungs can hold more air. Exercise becomes easier and circulation improves.   Quitting will reduce your risk of having a heart attack, stroke, cancer, or lung disease:   After 1 year,  the risk of coronary heart disease is cut in half.   After 5 years, the risk of stroke falls to the same as a nonsmoker.   After 10 years, the risk of lung cancer is cut in half and the risk of other cancers decreases significantly.   After 15 years, the risk of coronary heart disease drops, usually to the level of a nonsmoker.   If you are pregnant, quitting smoking will improve your chances of having a healthy baby.   The people you live with, especially your children, will be healthier.   You will have extra money to spend on things other than cigarettes.  FIVE KEYS TO QUITTING Studies have shown that these 5 steps will help you quit smoking and quit for good. You have the best chances of quitting if you use them together: 1. Get ready.  2. Get support and encouragement.  3. Learn new skills and behaviors.  4. Get medicine to reduce your nicotine addiction and use it correctly.  5. Be prepared for relapse or difficult situations. Be determined to continue trying to quit, even if you do not succeed at first.  1. GET READY  Set a quit date.   Change your environment.   Get rid of ALL cigarettes, ashtrays, matches, and lighters in your home, car, and place of work.   Do not let people smoke in your home.   Review your past attempts to quit. Think about what worked and what did not.   Once you quit, do not smoke. NOT EVEN A PUFF!  2. GET SUPPORT AND ENCOURAGEMENT Studies have shown that you have a better chance of being successful if you have help. You can get support in many ways.  Tell your family, friends, and coworkers that you are going to quit and need their support. Ask them not to smoke around you.   Talk to your caregivers (doctor, dentist, nurse, pharmacist, psychologist, and/or smoking counselor).   Get individual, group, or telephone counseling and support. The more counseling you have, the better your chances are of quitting. Programs are available at [employer] and health centers. Call your local health department for information about programs in your area.   Spiritual beliefs and practices may help some smokers quit.   Quit meters are Photographer that keep track of quit statistics, such as amount of "quit-time," cigarettes not smoked, and money saved.   Many smokers find one or more of the many self-help books available useful in helping them  quit and stay off tobacco.  3. LEARN NEW SKILLS AND BEHAVIORS  Try to distract yourself from urges to smoke. Talk to someone, go for a walk, or occupy your time with a task.   When you first try to quit, change your routine. Take a different route to work. Drink tea instead of coffee. Eat breakfast in a different place.   Do something to reduce your stress. Take a hot bath, exercise, or read a book.   Plan something enjoyable to do every day. Reward yourself for not smoking.   Explore interactive web-based programs that specialize in helping you quit.  4. GET MEDICINE AND USE IT CORRECTLY Medicines can help you stop smoking and decrease the urge to smoke. Combining medicine with the above behavioral methods and support can quadruple your chances of successfully quitting smoking. The U.[place]. Food and Drug Administration (FDA) has approved 7 medicines to help you quit smoking. These medicines fall into 3 categories.  Nicotine replacement therapy (delivers nicotine to your body without the negative effects and risks of smoking):   Nicotine gum: Available over-the-counter.   Nicotine lozenges: Available over-the-counter.   Nicotine inhaler: Available by prescription.   Nicotine nasal spray: Available by prescription.   Nicotine skin patches (transdermal): Available by prescription and over-the-counter.   Antidepressant medicine (helps people abstain from smoking, but how this works is unknown):   Bupropion sustained-release (SR) tablets: Available by  prescription.   Nicotinic receptor partial agonist (simulates the effect of nicotine in your brain):   Varenicline tartrate tablets: Available by prescription.   Ask your caregiver for advice about which medicines to use and how to use them. Carefully read the information on the package.   Everyone who is trying to quit may benefit from using a medicine. If you are pregnant or trying to become pregnant, nursing an infant, you are under age 98, or you smoke fewer than 10 cigarettes per day, talk to your caregiver before taking any nicotine replacement medicines.   You should stop using a nicotine replacement product and call your caregiver if you experience nausea, dizziness, weakness, vomiting, fast or irregular heartbeat, mouth problems with the lozenge or gum, or redness or swelling of the skin around the patch that does not go away.   Do not use any other product containing nicotine while using a nicotine replacement product.   Talk to your caregiver before using these products if you have diabetes, heart disease, asthma, stomach ulcers, you had a recent heart attack, you have high blood pressure that is not controlled with medicine, a history of irregular heartbeat, or you have been prescribed medicine to help you quit smoking.  5. BE PREPARED FOR RELAPSE OR DIFFICULT SITUATIONS  Most relapses occur within the first 3 months after quitting. Do not be discouraged if you start smoking again. Remember, most people try several times before they finally quit.   You may have symptoms of withdrawal because your body is used to nicotine. You may crave cigarettes, be irritable, feel very hungry, cough often, get headaches, or have difficulty concentrating.   The withdrawal symptoms are only temporary. They are strongest when you first quit, but they will go away within 10 to 14 days.  Here are some difficult situations to watch for:  Alcohol. Avoid drinking alcohol. Drinking lowers your chances of  successfully quitting.   Caffeine. Try to reduce the amount of caffeine you consume. It also lowers your chances of successfully quitting.   Other smokers. Being around  smoking can make you want to smoke. Avoid smokers.   Weight gain. Many smokers will gain weight when they quit, usually less than 10 pounds. Eat a healthy diet and stay active. Do not let weight gain distract you from your main goal, quitting smoking. Some medicines that help you quit smoking may also help delay weight gain. You can always lose the weight gained after you quit.   Bad mood or depression. There are a lot of ways to improve your mood other than smoking.  If you are having problems with any of these situations, talk to your caregiver. SPECIAL SITUATIONS AND CONDITIONS Studies suggest that everyone can quit smoking. Your situation or condition can give you a special reason to quit.  Pregnant women/new mothers: By quitting, you protect your baby's health and your own.   Hospitalized patients: By quitting, you reduce health problems and help healing.   Heart attack patients: By quitting, you reduce your risk of a second heart attack.   Lung, head, and neck cancer patients: By quitting, you reduce your chance of a second cancer.   Parents of children and adolescents: By quitting, you protect your children from illnesses caused by secondhand smoke.  QUESTIONS TO THINK ABOUT Think about the following questions before you try to stop smoking. You may want to talk about your answers with your caregiver.  Why do you want to quit?   If you tried to quit in the past, what helped and what did not?   What will be the most difficult situations for you after you quit? How will you plan to handle them?   Who can help you through the tough times? Your family? Friends? Caregiver?   What pleasures do you get from smoking? What ways can you still get pleasure if you quit?  Here are some questions to ask your  caregiver:  How can you help me to be successful at quitting?   What medicine do you think would be best for me and how should I take it?   What should I do if I need more help?   What is smoking withdrawal like? How can I get information on withdrawal?  Quitting takes hard work and a lot of effort, but you can quit smoking. FOR MORE INFORMATION  Smokefree.gov (http://www.davis-sullivan.com/) provides free, accurate, evidence-based information and professional assistance to help support the immediate and long-term needs of people trying to quit smoking. Document Released: 01/23/2001 Document Revised: 01/18/2011 Document Reviewed: 11/15/2008 [facility] Patient Information 2012 [place], [place].Marland KitchenC.

## 2011-04-29 LAB — CULTURE, BLOOD (ROUTINE X 2)
Culture: NO GROWTH
Culture: NO GROWTH

## 2011-10-01 ENCOUNTER — Encounter (HOSPITAL_COMMUNITY): Payer: Self-pay | Admitting: *Deleted

## 2011-10-01 ENCOUNTER — Inpatient Hospital Stay (HOSPITAL_COMMUNITY)
Admission: EM | Admit: 2011-10-01 | Discharge: 2011-10-04 | DRG: 189 | Disposition: A | Discharge status: Home / Self Care | Payer: Medicare Other | Attending: Internal Medicine | Admitting: Internal Medicine

## 2011-10-01 ENCOUNTER — Emergency Department (HOSPITAL_COMMUNITY): Payer: Medicare Other

## 2011-10-01 DIAGNOSIS — J449 Chronic obstructive pulmonary disease, unspecified: Secondary | ICD-10-CM

## 2011-10-01 DIAGNOSIS — F419 Anxiety disorder, unspecified: Secondary | ICD-10-CM

## 2011-10-01 DIAGNOSIS — Z823 Family history of stroke: Secondary | ICD-10-CM

## 2011-10-01 DIAGNOSIS — Z87891 Personal history of nicotine dependence: Secondary | ICD-10-CM

## 2011-10-01 DIAGNOSIS — I10 Essential (primary) hypertension: Secondary | ICD-10-CM

## 2011-10-01 DIAGNOSIS — J441 Chronic obstructive pulmonary disease with (acute) exacerbation: Secondary | ICD-10-CM | POA: Diagnosis present

## 2011-10-01 DIAGNOSIS — F411 Generalized anxiety disorder: Secondary | ICD-10-CM | POA: Diagnosis present

## 2011-10-01 DIAGNOSIS — Z720 Tobacco use: Secondary | ICD-10-CM | POA: Diagnosis present

## 2011-10-01 DIAGNOSIS — Z8249 Family history of ischemic heart disease and other diseases of the circulatory system: Secondary | ICD-10-CM

## 2011-10-01 DIAGNOSIS — J9600 Acute respiratory failure, unspecified whether with hypoxia or hypercapnia: Principal | ICD-10-CM

## 2011-10-01 DIAGNOSIS — D649 Anemia, unspecified: Secondary | ICD-10-CM | POA: Diagnosis present

## 2011-10-01 DIAGNOSIS — Z79899 Other long term (current) drug therapy: Secondary | ICD-10-CM

## 2011-10-01 LAB — BLOOD GAS, ARTERIAL
Acid-Base Excess: 6 mmol/L — ABNORMAL HIGH (ref 0.0–2.0)
Bicarbonate: 30.9 mEq/L — ABNORMAL HIGH (ref 20.0–24.0)
O2 Saturation: 91.8 %
TCO2: 27.1 mmol/L (ref 0–100)
pO2, Arterial: 61.2 mmHg — ABNORMAL LOW (ref 80.0–100.0)

## 2011-10-01 MED ORDER — IPRATROPIUM BROMIDE 0.02 % IN SOLN
RESPIRATORY_TRACT | Status: AC
  Administered 2011-10-01: 0.5 mg
  Filled 2011-10-01: qty 2.5

## 2011-10-01 MED ORDER — IPRATROPIUM BROMIDE 0.02 % IN SOLN: 0.5000 mg | Freq: Once | RESPIRATORY_TRACT | Status: DC

## 2011-10-01 MED ORDER — ALBUTEROL SULFATE (5 MG/ML) 0.5% IN NEBU
INHALATION_SOLUTION | RESPIRATORY_TRACT | Status: AC
  Administered 2011-10-01: 5 mg
  Filled 2011-10-01: qty 1

## 2011-10-01 MED ORDER — ALBUTEROL SULFATE (5 MG/ML) 0.5% IN NEBU: 5.0000 mg | INHALATION_SOLUTION | Freq: Once | RESPIRATORY_TRACT | Status: DC

## 2011-10-01 MED ORDER — METHYLPREDNISOLONE SODIUM SUCC 125 MG IJ SOLR
125.0000 mg | Freq: Once | INTRAMUSCULAR | Status: AC
  Administered 2011-10-02: 125 mg via INTRAVENOUS
  Filled 2011-10-01: qty 2

## 2011-10-01 NOTE — ED Provider Notes (Cosign Needed)
History  This chart was scribed for [employer]. Colon Branch, MD by Erskine Emery. This patient was seen in room APA07/APA07 and the patient's care was started at 23:18.   CSN: [id number]  Arrival date & time 10/01/11  2156   First MD Initiated Contact with Patient 10/01/11 2318      Chief Complaint  Patient presents with  . Shortness of Breath    (Consider location/radiation/quality/duration/timing/severity/associated sxs/prior treatment) HPI  Shane Todd is a 66 y.o. male with a h/o COPD who presents to the Emergency Department complaining of SOB, cough, wheezing and near syncope lightheadedness for the last couple days.  Pt denies any associated fever or chills. Pt was admitted to [facility] in March for pneumonia and was on life support. Pt's wife reports he recieved steriods while admitted. Pt sleeps on 2 pillows at night and uses spireva but is not on a machine respirator at home.  Pt reports he has "basically quit" smoking.  Dr. Regino Schultze is the pt's PCP (who he tried to see tonight but couldn't so he came here.)  Past Medical History  Diagnosis Date  . COPD (chronic obstructive pulmonary disease)   . Hypertension   . Tobacco abuse   . Anxiety   . Anemia 04/24/2011    History reviewed. No pertinent past surgical history.  Family History  Problem Relation Age of Onset  . Stroke Mother   . Deep vein thrombosis Father   . Lung cancer Sister   . Stroke Sister   . Hypertension Sister   . Heart disease Brother   . Stroke Brother   . Stroke Sister   . Heart disease Sister     History  Substance Use Topics  . Smoking status: Former Smoker -- 0.5 packs/day  . Smokeless tobacco: Not on file  . Alcohol Use: No      Review of Systems  Constitutional: Negative for fever and chills.  Respiratory: Positive for cough, shortness of breath and wheezing.   Gastrointestinal: Negative for nausea and vomiting.  Neurological: Negative for syncope.  All other systems reviewed  and are negative.     Allergies  Review of patient's allergies indicates no known allergies.  Home Medications   Current Outpatient Rx  Name Route Sig Dispense Refill  . ALBUTEROL SULFATE HFA 108 (90 BASE) MCG/ACT IN AERS Inhalation Inhale 2 puffs into the lungs every 6 (six) hours as needed. For shortness of breath    . ALPRAZOLAM 0.5 MG PO TABS Oral Take 0.5 mg by mouth at bedtime as needed. For sleep    . LISINOPRIL 20 MG PO TABS Oral Take 20 mg by mouth daily.    Marland Kitchen OMEPRAZOLE 20 MG PO CPDR Oral Take 20 mg by mouth daily.    Marland Kitchen TIOTROPIUM BROMIDE MONOHYDRATE 18 MCG IN CAPS Inhalation Place 18 mcg into inhaler and inhale daily as needed. For shortness of breath    . TRAZODONE HCL 50 MG PO TABS Oral Take 50 mg by mouth at bedtime.      Triage Vitals: BP 174/92  Pulse 89  Temp 98 F (36.7 C) (Oral)  Resp 26  Ht 6' (1.829 m)  Wt 160 lb (72.576 kg)  BMI 21.70 kg/m2  SpO2 94%  Physical Exam  Nursing note and vitals reviewed. Constitutional: He is oriented to person, place, and time. He appears well-developed and well-nourished.  HENT:  Head: Normocephalic and atraumatic.  Eyes: EOM are normal.  Neck: Neck supple. No tracheal deviation present.  Cardiovascular:  Normal rate.   Pulmonary/Chest: No respiratory distress. He has wheezes.       inspiratory and expratory wheezing diffusely througout both lungs. No pulmonary edema. Active coarse cough.  Abdominal: Soft. He exhibits no distension.  Musculoskeletal: Normal range of motion. He exhibits no edema.  Neurological: He is alert and oriented to person, place, and time.  Skin: Skin is warm and dry.  Psychiatric: He has a normal mood and affect.    ED Course  Procedures (including critical care time) DIAGNOSTIC STUDIES: Oxygen Saturation is 94% on room air, adequate by my interpretation.    COORDINATION OF CARE: 23:23--I evaluated the patient and we discussed a treatment plan including chest x-ray, blood work, breathing  treatment, and steriods to which the pt agreed.   Results for orders placed during the hospital encounter of 10/01/11  BLOOD GAS, ARTERIAL      Component Value Range   O2 Content 21.0     pH, Arterial 7.385  7.350 - 7.450   pCO2 arterial 52.8 (*) 35.0 - 45.0 mmHg   pO2, Arterial 61.2 (*) 80.0 - 100.0 mmHg   Bicarbonate 30.9 (*) 20.0 - 24.0 mEq/L   TCO2 27.1  0 - 100 mmol/L   Acid-Base Excess 6.0 (*) 0.0 - 2.0 mmol/L   O2 Saturation 91.8     Patient temperature 37.0     Collection site LEFT RADIAL     Drawn by [id number]     Sample type ARTERIAL     Allens test (pass/fail) PASS  PASS   Dg Chest 2 View  10/01/2011  *RADIOLOGY REPORT*  Clinical Data: Shortness of breath.  Nonproductive cough.  CHEST - 2 VIEW  Comparison: 04/25/2011  Findings: Interval removal of endotracheal tube.  Normal heart size and pulmonary vascularity.  Diffuse emphysematous changes and fibrosis in the lungs.  No focal airspace consolidation.  No edema. No blunting of costophrenic angles.  No pneumothorax.  Mediastinal contours appear intact.  Calcification of the aorta.  Degenerative changes in the spine.  IMPRESSION: Emphysematous changes and scattered fibrosis in the lungs.  No evidence of active pulmonary disease.   Original Report Authenticated By: Marlon Pel, M.D.      0015 Wheezing continues. Moving better air, still able to talk in full sentences. States he feels a bit better. 0130 s/p second nebulizer treatment. Wheezing has improved slightly. Spoke with patient re admission. 2:09 AM:  T/C to Dr. Onalee Hua, hospitalist, case discussed, including:  HPI, pertinent PM/SHx, VS/PE, dx testing, ED course and treatment.  Agreeable to admission  Requests to write temporary orders, telemetrybed to team 2.    MDM  Patient with COPD, increased SOB over the last few days with increased wheezing. Chest xray without acute findings. ABG c/w COPD. Minimal improvement with nebulizer treatments x 2. Solumedrol given. Spoke  with Dr. Onalee Hua, hospitalist who will admit the patient to telemetry.Pt stable in ED with no significant deterioration in condition.The patient appears reasonably stabilized for admission considering the current resources, flow, and capabilities available in the ED at this time, and I doubt any other [facility] requiring further screening and/or treatment in the ED prior to admission.  I personally performed the services described in this documentation, which was scribed in my presence. The recorded information has been reviewed and considered.   MDM Reviewed: nursing note and vitals Interpretation: labs and x-ray  CRITICAL CARE NOTE: Critical care time was provided for 40 minutes exclusive of separately billable procedures and treating other patients.  This  was necessary to treat or prevent further deterioration of the following condition(s) shortness of breath which the patient had and/or had a high probability of suddenly developing. This involved direct bedside patient care, speaking with family members, review of past medical records, reviewing the results of the laboratory and diagnostic studies, consulting with other physicians, as well as evaluating the effectiveness of the therapy instituted as described.           Nicoletta Dress. Colon Branch, MD 10/02/11 [phone]

## 2011-10-01 NOTE — ED Notes (Signed)
COPD - has used his Spireva and rescue inhaler today - dyspnea increasing.  O2 Sat 94 on room air on arrival.  Hx Pneumonia March of this year

## 2011-10-01 NOTE — ED Notes (Signed)
Sob all day, cough, nonproductive,  No fever.

## 2011-10-02 ENCOUNTER — Encounter (HOSPITAL_COMMUNITY): Payer: Self-pay | Admitting: *Deleted

## 2011-10-02 DIAGNOSIS — J449 Chronic obstructive pulmonary disease, unspecified: Secondary | ICD-10-CM

## 2011-10-02 DIAGNOSIS — I10 Essential (primary) hypertension: Secondary | ICD-10-CM | POA: Diagnosis present

## 2011-10-02 DIAGNOSIS — J9600 Acute respiratory failure, unspecified whether with hypoxia or hypercapnia: Principal | ICD-10-CM

## 2011-10-02 DIAGNOSIS — F419 Anxiety disorder, unspecified: Secondary | ICD-10-CM | POA: Diagnosis present

## 2011-10-02 DIAGNOSIS — F411 Generalized anxiety disorder: Secondary | ICD-10-CM

## 2011-10-02 LAB — CBC
HCT: 44.2 % (ref 39.0–52.0)
Hemoglobin: 13.9 g/dL (ref 13.0–17.0)
Hemoglobin: 14.8 g/dL (ref 13.0–17.0)
MCH: 31 pg (ref 26.0–34.0)
MCV: 92.6 fL (ref 78.0–100.0)
MCV: 92.5 fL (ref 78.0–100.0)
RBC: 4.48 MIL/uL (ref 4.22–5.81)
RBC: 4.78 MIL/uL (ref 4.22–5.81)
WBC: 7.5 10*3/uL (ref 4.0–10.5)

## 2011-10-02 LAB — BASIC METABOLIC PANEL
CO2: 32 mEq/L (ref 19–32)
CO2: 32 mEq/L (ref 19–32)
Calcium: 9.3 mg/dL (ref 8.4–10.5)
Chloride: 98 mEq/L (ref 96–112)
Creatinine, Ser: 0.91 mg/dL (ref 0.50–1.35)
Creatinine, Ser: 0.91 mg/dL (ref 0.50–1.35)
Glucose, Bld: 113 mg/dL — ABNORMAL HIGH (ref 70–99)

## 2011-10-02 MED ORDER — SODIUM CHLORIDE 0.9 % IN NEBU
INHALATION_SOLUTION | RESPIRATORY_TRACT | Status: AC
  Administered 2011-10-02: 3 mL
  Filled 2011-10-02: qty 3

## 2011-10-02 MED ORDER — SODIUM CHLORIDE 0.9 % IJ SOLN
3.0000 mL | INTRAMUSCULAR | Status: DC | PRN
  Administered 2011-10-02: 3 mL via INTRAVENOUS

## 2011-10-02 MED ORDER — SODIUM CHLORIDE 0.9 % IV SOLN
250.0000 mL | INTRAVENOUS | Status: DC | PRN
  Administered 2011-10-02: 250 mL via INTRAVENOUS

## 2011-10-02 MED ORDER — SODIUM CHLORIDE 0.9 % IJ SOLN
INTRAMUSCULAR | Status: AC
  Administered 2011-10-02: 3 mL via INTRAVENOUS
  Filled 2011-10-02: qty 3

## 2011-10-02 MED ORDER — METHYLPREDNISOLONE SODIUM SUCC 125 MG IJ SOLR
INTRAMUSCULAR | Status: AC
  Administered 2011-10-02: 80 mg via INTRAVENOUS
  Filled 2011-10-02: qty 2

## 2011-10-02 MED ORDER — SODIUM CHLORIDE 0.9 % IJ SOLN
INTRAMUSCULAR | Status: AC
  Filled 2011-10-02: qty 3

## 2011-10-02 MED ORDER — ALBUTEROL SULFATE (5 MG/ML) 0.5% IN NEBU
2.5000 mg | INHALATION_SOLUTION | Freq: Once | RESPIRATORY_TRACT | Status: AC
  Administered 2011-10-02: 2.5 mg via RESPIRATORY_TRACT
  Filled 2011-10-02: qty 0.5

## 2011-10-02 MED ORDER — IPRATROPIUM BROMIDE 0.02 % IN SOLN
0.5000 mg | Freq: Four times a day (QID) | RESPIRATORY_TRACT | Status: DC
  Administered 2011-10-02 – 2011-10-04 (×9): 0.5 mg via RESPIRATORY_TRACT
  Filled 2011-10-02 (×9): qty 2.5

## 2011-10-02 MED ORDER — TRAZODONE HCL 50 MG PO TABS
50.0000 mg | ORAL_TABLET | Freq: Every day | ORAL | Status: DC
  Administered 2011-10-02 – 2011-10-03 (×2): 50 mg via ORAL
  Filled 2011-10-02 (×3): qty 1

## 2011-10-02 MED ORDER — ALBUTEROL SULFATE (5 MG/ML) 0.5% IN NEBU
2.5000 mg | INHALATION_SOLUTION | Freq: Four times a day (QID) | RESPIRATORY_TRACT | Status: DC
  Administered 2011-10-02 – 2011-10-04 (×9): 2.5 mg via RESPIRATORY_TRACT
  Filled 2011-10-02 (×9): qty 0.5

## 2011-10-02 MED ORDER — ALBUTEROL SULFATE (5 MG/ML) 0.5% IN NEBU: 2.5000 mg | INHALATION_SOLUTION | RESPIRATORY_TRACT | Status: DC | PRN

## 2011-10-02 MED ORDER — ALBUTEROL SULFATE (5 MG/ML) 0.5% IN NEBU
2.5000 mg | INHALATION_SOLUTION | RESPIRATORY_TRACT | Status: DC | PRN
  Administered 2011-10-02: 2.5 mg via RESPIRATORY_TRACT
  Filled 2011-10-02: qty 0.5

## 2011-10-02 MED ORDER — SODIUM CHLORIDE 0.9 % IV SOLN
Freq: Once | INTRAVENOUS | Status: AC
  Administered 2011-10-02: 20 mL/h via INTRAVENOUS

## 2011-10-02 MED ORDER — PREDNISONE 20 MG PO TABS
30.0000 mg | ORAL_TABLET | Freq: Two times a day (BID) | ORAL | Status: DC
  Filled 2011-10-02: qty 1

## 2011-10-02 MED ORDER — MOXIFLOXACIN HCL IN NACL 400 MG/250ML IV SOLN
400.0000 mg | INTRAVENOUS | Status: DC
  Administered 2011-10-02 – 2011-10-03 (×2): 400 mg via INTRAVENOUS
  Filled 2011-10-02 (×2): qty 250

## 2011-10-02 MED ORDER — ALPRAZOLAM 0.5 MG PO TABS
0.5000 mg | ORAL_TABLET | Freq: Every evening | ORAL | Status: DC | PRN
  Administered 2011-10-02 – 2011-10-03 (×2): 0.5 mg via ORAL
  Filled 2011-10-02 (×2): qty 1

## 2011-10-02 MED ORDER — SODIUM CHLORIDE 0.9 % IJ SOLN
3.0000 mL | Freq: Two times a day (BID) | INTRAMUSCULAR | Status: DC
  Administered 2011-10-03 (×2): 3 mL via INTRAVENOUS
  Filled 2011-10-02 (×2): qty 3

## 2011-10-02 MED ORDER — TIOTROPIUM BROMIDE MONOHYDRATE 18 MCG IN CAPS
18.0000 ug | ORAL_CAPSULE | Freq: Every day | RESPIRATORY_TRACT | Status: DC
  Filled 2011-10-02: qty 5

## 2011-10-02 MED ORDER — SODIUM CHLORIDE 0.9 % IV SOLN: INTRAVENOUS | Status: DC

## 2011-10-02 MED ORDER — SODIUM CHLORIDE 0.9 % IJ SOLN
3.0000 mL | Freq: Two times a day (BID) | INTRAMUSCULAR | Status: DC
  Administered 2011-10-02: 3 mL via INTRAVENOUS
  Filled 2011-10-02: qty 3

## 2011-10-02 MED ORDER — METHYLPREDNISOLONE SODIUM SUCC 125 MG IJ SOLR
80.0000 mg | Freq: Four times a day (QID) | INTRAMUSCULAR | Status: DC
  Administered 2011-10-02 – 2011-10-03 (×5): 80 mg via INTRAVENOUS
  Filled 2011-10-02 (×4): qty 2

## 2011-10-02 MED ORDER — LISINOPRIL 10 MG PO TABS
20.0000 mg | ORAL_TABLET | Freq: Every day | ORAL | Status: DC
  Administered 2011-10-02 – 2011-10-04 (×3): 20 mg via ORAL
  Filled 2011-10-02: qty 2
  Filled 2011-10-02: qty 1
  Filled 2011-10-02: qty 2
  Filled 2011-10-02: qty 1
  Filled 2011-10-02: qty 2

## 2011-10-02 NOTE — H&P (Signed)
PCP:   Kirk Ruths, MD   Chief Complaint:  Sob and wheezing  HPI: 66 yo male h/o copd comes in with one day of worsening sob and wheezing.  Has alb inh at home but not nebulizer.  No fevers.  No cough.  Was hospitalized in march 2013 had to be emergently intubated with bad pna and quit smoking after that hospitlaization.  Since then has been well and has not requried any abx or steroids.  Not on oxygen at home.  Feels better after several nebs and steroidss in the ed.  Review of Systems:  O/w neg  Past Medical History: Past Medical History  Diagnosis Date  . COPD (chronic obstructive pulmonary disease)   . Hypertension   . Tobacco abuse   . Anxiety   . Anemia 04/24/2011   History reviewed. No pertinent past surgical history.  Medications: Prior to Admission medications   Medication Sig Start Date End Date Taking? Authorizing Provider  albuterol (PROVENTIL HFA;VENTOLIN HFA) 108 (90 BASE) MCG/ACT inhaler Inhale 2 puffs into the lungs every 6 (six) hours as needed. For shortness of breath   Yes Historical Provider, MD  ALPRAZolam Prudy Feeler) 0.5 MG tablet Take 0.5 mg by mouth at bedtime as needed. For sleep   Yes Historical Provider, MD  lisinopril (PRINIVIL,ZESTRIL) 20 MG tablet Take 20 mg by mouth daily.   Yes Historical Provider, MD  omeprazole (PRILOSEC) 20 MG capsule Take 20 mg by mouth daily.   Yes Historical Provider, MD  tiotropium (SPIRIVA) 18 MCG inhalation capsule Place 18 mcg into inhaler and inhale daily as needed. For shortness of breath   Yes Historical Provider, MD  traZODone (DESYREL) 50 MG tablet Take 50 mg by mouth at bedtime.   Yes Historical Provider, MD    Allergies:  No Known Allergies  Social History:  reports that he has quit smoking. He does not have any smokeless tobacco history on file. He reports that he does not drink alcohol. His drug history not on file.  Family History: Family History  Problem Relation Age of Onset  . Stroke Mother   . Deep  vein thrombosis Father   . Lung cancer Sister   . Stroke Sister   . Hypertension Sister   . Heart disease Brother   . Stroke Brother   . Stroke Sister   . Heart disease Sister     Physical Exam: Filed Vitals:   10/01/11 2157 10/02/11 0108  BP: 174/92   Pulse: 89   Temp: 98 F (36.7 C)   TempSrc: Oral   Resp: 26   Height: 6' (1.829 m)   Weight: 72.576 kg (160 lb)   SpO2: 94% 94%   General appearance: alert, cooperative and no distress can speak in full sentences Lungs: wheezes bibasilar Heart: regular rate and rhythm, S1, S2 normal, no murmur, click, rub or gallop Abdomen: soft, non-tender; bowel sounds normal; no masses,  no organomegaly Extremities: extremities normal, atraumatic, no cyanosis or edema Pulses: 2+ and symmetric Skin: Skin color, texture, turgor normal. No rashes or lesions Neurologic: Grossly normal    Labs on Admission:   [facility] 10/02/11 0106  NA 134*  K 3.9  CL 96  CO2 32  GLUCOSE 113*  BUN 19  CREATININE 0.91  CALCIUM 9.3  MG --  PHOS --    Basename 10/02/11 0106  WBC 9.1  NEUTROABS --  HGB 13.9  HCT 41.5  MCV 92.6  PLT 172   Radiological Exams on Admission: Dg Chest 2  View  10/01/2011  *RADIOLOGY REPORT*  Clinical Data: Shortness of breath.  Nonproductive cough.  CHEST - 2 VIEW  Comparison: 04/25/2011  Findings: Interval removal of endotracheal tube.  Normal heart size and pulmonary vascularity.  Diffuse emphysematous changes and fibrosis in the lungs.  No focal airspace consolidation.  No edema. No blunting of costophrenic angles.  No pneumothorax.  Mediastinal contours appear intact.  Calcification of the aorta.  Degenerative changes in the spine.  IMPRESSION: Emphysematous changes and scattered fibrosis in the lungs.  No evidence of active pulmonary disease.   Original Report Authenticated By: Marlon Pel, M.D.     Assessment/Plan Present on Admission:  66 yo male with copde .Acute respiratory failure .COPD with  exacerbation .Tobacco abuse .Anxiety .Hypertension  Frequent nebs, oxygen.  Steroids po.  likley turn around quickly in next 24 to 48 hours.  Already has had much improvement in ED.  No pna on cxr.  DAVID,RACHAL [phone] 10/02/2011, 2:22 AM

## 2011-10-02 NOTE — Progress Notes (Signed)
Mr. Shane Todd was admitted this morning with COPD exacerbation. He is feeling much better today. Changed his steroids from by mouth to IV, add Avelox daily continue nebulizer treatments scheduled and when necessary. Transfer patient from the ICU to telemetry bed.

## 2011-10-02 NOTE — Progress Notes (Signed)
UR Chart Review Completed  

## 2011-10-02 NOTE — ED Notes (Signed)
MD at bedside.- Dr. Onalee Hua talking with patient and family member

## 2011-10-02 NOTE — Care Management Note (Signed)
    Page 1 of 1   10/04/2011     10:05:06 AM   CARE MANAGEMENT NOTE 10/04/2011  Patient:  Shane Todd, Shane Todd   Account Number:  [id number]  Date Initiated:  10/02/2011  Documentation initiated by:  Rosemary Holms  Subjective/Objective Assessment:   Pt admitted from home where he lives with his wife. Per pt he uses his wife's O2 when he needs it. Without the O2, he desats to 89-93 PTA. Declines HH or [facility] service manager. States they are "educated" about their disease and do not...     Action/Plan:   need assistance at this time.   Anticipated DC Date:  10/03/2011   Anticipated DC Plan:  HOME/SELF CARE      DC Planning Services  CM consult      Choice offered to / List presented to:             Status of service:  Completed, signed off Medicare Important Message given?   (If response is "NO", the following Medicare IM given date fields will be blank) Date Medicare IM given:   Date Additional Medicare IM given:    Discharge Disposition:  HOME/SELF CARE  Per UR Regulation:    If discussed at Long Length of Stay Meetings, dates discussed:    Comments:  10/03/11 900  Kieren Ricci RN BSN CM Pt continues to decline [facility] RN for COPD program. Today while ambulating, his sats were 91%. No O2 for discharge ordered.  10/02/11 1400 Maddalynn Barnard Leanord Hawking RN [facility]

## 2011-10-02 NOTE — Progress Notes (Addendum)
PT TRANSFERRED TO ROOM ICU-11,  WHICH IS A STEPDOWN BED.  ROOM ICU-03 NEEDED [facility]O CRITICAL ICU PT. PT AND HIS WIFE WHERE MORE THAN WILLING FOR PT TO BE MOVED. PT CONTINUES TO HAVE SAME RN PROVIDING HIS CARE.

## 2011-10-03 LAB — BASIC METABOLIC PANEL
CO2: 34 mEq/L — ABNORMAL HIGH (ref 19–32)
Calcium: 9.7 mg/dL (ref 8.4–10.5)
Creatinine, Ser: 0.93 mg/dL (ref 0.50–1.35)
Glucose, Bld: 148 mg/dL — ABNORMAL HIGH (ref 70–99)

## 2011-10-03 LAB — CBC
HCT: 44.3 % (ref 39.0–52.0)
Hemoglobin: 14.7 g/dL (ref 13.0–17.0)
MCH: 31.3 pg (ref 26.0–34.0)
MCV: 94.3 fL (ref 78.0–100.0)
RBC: 4.7 MIL/uL (ref 4.22–5.81)

## 2011-10-03 MED ORDER — SULFAMETHOXAZOLE-TMP DS 800-160 MG PO TABS
1.0000 | ORAL_TABLET | Freq: Two times a day (BID) | ORAL | Status: DC
  Administered 2011-10-03 – 2011-10-04 (×3): 1 via ORAL
  Filled 2011-10-03 (×3): qty 1

## 2011-10-03 MED ORDER — GUAIFENESIN ER 600 MG PO TB12
1200.0000 mg | ORAL_TABLET | Freq: Two times a day (BID) | ORAL | Status: DC
  Administered 2011-10-03 (×2): 1200 mg via ORAL
  Filled 2011-10-03 (×2): qty 2

## 2011-10-03 MED ORDER — PREDNISONE 20 MG PO TABS
40.0000 mg | ORAL_TABLET | Freq: Every day | ORAL | Status: DC
  Administered 2011-10-04: 40 mg via ORAL
  Filled 2011-10-03: qty 2

## 2011-10-03 NOTE — Progress Notes (Signed)
Called report to Morrie Sheldon, RN on Dept 300.  Verbalized understanding.  Patient escorted to 303 via wheelchair.  Schonewitz, Candelaria Stagers  10/03/2011

## 2011-10-03 NOTE — Progress Notes (Signed)
Found patient not wearing nasal cannula, oxygen saturation 85% room air.  Replaced nasal cannula back in nares.  Will continue to monitor and attempt to walk patient without oxygen as ordered to check oxygen saturation on room air with ambulation.  Schonewitz, Candelaria Stagers 10/03/2011

## 2011-10-03 NOTE — Progress Notes (Signed)
Ambulated patient to nursing station and found room air saturation at 78%.  Returned patient to room, replaced oxygen and notified MD of results.  Schonewitz, Candelaria Stagers 10/03/2011

## 2011-10-03 NOTE — Progress Notes (Signed)
[facility] PROGRESS NOTE  ASTOR GENTLE WUJ:[id number] DOB: 02-07-1946 DOA: 10/01/2011 PCP: Kirk Ruths, MD  Assessment/Plan: Principal Problem:  *COPD with exacerbation Active Problems:  Acute respiratory failure  Tobacco abuse  Anxiety  Hypertension  1. COPD exacerbation. Patient appears to be clinically improving. We will change his steroids to prednisone. Change antibiotics to oral Augmentin. He weaned down oxygen as tolerated. Ambulate patient and recorded oxygen saturations on room air at rest and on ambulation. He may need a nebulizer at home. 2. Hypertension. Stable. 3. Anxiety. Stable.  Code Status: Full code Family Communication: Discussed with patient Disposition Plan: Possible discharge tomorrow if continues to improve   Brief narrative: This gentleman was admitted from the emergency room with worsening shortness of breath and wheezing. He was also wheezing. He was using his albuterol inhaler at home without significant relief. He was admitted for further treatment of COPD exacerbation.  Consultants:  None  Procedures:  None  Antibiotics:  Avelox  HPI/Subjective: Feeling better today. Reports a good cough. No wheezing.  Objective: Filed Vitals:   10/03/11 0400 10/03/11 0653 10/03/11 0730 10/03/11 0922  BP: 160/82   123/75  Pulse: 88   104  Temp: 97.7 F (36.5 C)  97.3 F (36.3 C)   TempSrc: Oral  Oral   Resp: 20   20  Height:      Weight:      SpO2: 94% 94%  96%    Intake/Output Summary (Last 24 hours) at 10/03/11 1038 Last data filed at 10/03/11 0800  Gross per 24 hour  Intake   1230 ml  Output      0 ml  Net   1230 ml   Filed Weights   10/01/11 2157 10/02/11 0525  Weight: 72.576 kg (160 lb) 69.4 kg (153 lb)    Exam:   General:  No acute distress  Cardiovascular: S1, S2, regular rate and rhythm  Respiratory: Clear to auscultation bilaterally  Abdomen: Soft, nontender, nondistended, bowel sounds active  Data  Reviewed: Basic Metabolic Panel:  Lab 10/03/11 9562 10/02/11 0437 10/02/11 0106  NA 138 137 134*  K 4.5 3.9 3.9  CL 98 98 96  CO2 34* 32 32  GLUCOSE 148* 146* 113*  BUN 20 18 19   CREATININE 0.93 0.91 0.91  CALCIUM 9.7 9.7 9.3  MG -- -- --  PHOS -- -- --   Liver Function Tests: No results found for this basename: AST:5,ALT:5,ALKPHOS:5,BILITOT:5,PROT:5,ALBUMIN:5 in the last 168 hours No results found for this basename: LIPASE:5,AMYLASE:5 in the last 168 hours No results found for this basename: AMMONIA:5 in the last 168 hours CBC:  Lab 10/03/11 0525 10/02/11 0437 10/02/11 0106  WBC 12.8* 7.5 9.1  NEUTROABS -- -- --  HGB 14.7 14.8 13.9  HCT 44.3 44.2 41.5  MCV 94.3 92.5 92.6  PLT 201 171 172   Cardiac Enzymes: No results found for this basename: CKTOTAL:5,CKMB:5,CKMBINDEX:5,TROPONINI:5 in the last 168 hours BNP (last 3 results) No results found for this basename: PROBNP:3 in the last 8760 hours CBG: No results found for this basename: GLUCAP:5 in the last 168 hours  Recent Results (from the past 240 hour(s))  MRSA PCR SCREENING     Status: Normal   Collection Time   10/02/11  5:49 AM      Component Value Range Status Comment   MRSA by PCR NEGATIVE  NEGATIVE Final      Studies: Dg Chest 2 View  10/01/2011  *RADIOLOGY REPORT*  Clinical Data: Shortness of breath.  Nonproductive cough.  CHEST - 2 VIEW  Comparison: 04/25/2011  Findings: Interval removal of endotracheal tube.  Normal heart size and pulmonary vascularity.  Diffuse emphysematous changes and fibrosis in the lungs.  No focal airspace consolidation.  No edema. No blunting of costophrenic angles.  No pneumothorax.  Mediastinal contours appear intact.  Calcification of the aorta.  Degenerative changes in the spine.  IMPRESSION: Emphysematous changes and scattered fibrosis in the lungs.  No evidence of active pulmonary disease.   Original Report Authenticated By: Marlon Pel, M.D.     Scheduled Meds:   .  albuterol  2.5 mg Nebulization Q6H  . guaiFENesin  1,200 mg Oral BID  . ipratropium  0.5 mg Nebulization Q6H  . lisinopril  20 mg Oral Daily  . predniSONE  40 mg Oral Q breakfast  . sodium chloride  3 mL Intravenous Q12H  . sodium chloride  3 mL Intravenous Q12H  . sodium chloride      . sulfamethoxazole-trimethoprim  1 tablet Oral Q12H  . traZODone  50 mg Oral QHS  . DISCONTD: methylPREDNISolone (SOLU-MEDROL) injection  80 mg Intravenous Q6H  . DISCONTD: moxifloxacin  400 mg Intravenous Q24H   Continuous Infusions:   Principal Problem:  *COPD with exacerbation Active Problems:  Acute respiratory failure  Tobacco abuse  Anxiety  Hypertension    Time spent: 25 minutes    MEMON,JEHANZEB  [facility] Pager [phone]. If 7PM-7AM, please contact night-coverage at [email or web address], password [facility] 10/03/2011, 10:38 AM  LOS: 2 days

## 2011-10-04 MED ORDER — PREDNISONE 10 MG PO TABS: ORAL_TABLET | ORAL | Status: DC

## 2011-10-04 MED ORDER — GUAIFENESIN ER 600 MG PO TB12: 1200.0000 mg | ORAL_TABLET | Freq: Two times a day (BID) | ORAL | Status: DC

## 2011-10-04 MED ORDER — SULFAMETHOXAZOLE-TMP DS 800-160 MG PO TABS: 1.0000 | ORAL_TABLET | Freq: Two times a day (BID) | ORAL | Status: AC

## 2011-10-04 MED ORDER — ALBUTEROL SULFATE (2.5 MG/3ML) 0.083% IN NEBU: 2.5000 mg | INHALATION_SOLUTION | Freq: Four times a day (QID) | RESPIRATORY_TRACT | Status: DC | PRN

## 2011-10-04 NOTE — Progress Notes (Signed)
Discharge instructions given to patient on medications,and follow up visits,patient verbalized understanding.Prescriptions sent with patient. Staff to accompany patient to awaiting vehicle.

## 2011-10-04 NOTE — Discharge Summary (Signed)
Physician Discharge Summary  Shane Todd ZOX:[id number] DOB: 1945-06-28 DOA: 10/01/2011  PCP: Kirk Ruths, MD  Admit date: 10/01/2011 Discharge date: 10/04/2011  Recommendations for Outpatient Follow-up:  1. Followup with primary care physician in one to 2 weeks 2. Patient declined home health RN for COPD management  Discharge Diagnoses:  Principal Problem:  *COPD with exacerbation Active Problems:  Acute respiratory failure  Tobacco abuse  Anxiety  Hypertension   Discharge Condition: Improved  Diet recommendation: Low-salt  Filed Weights   10/01/11 2157 10/02/11 0525  Weight: 72.576 kg (160 lb) 69.4 kg (153 lb)    History of present illness:  66 yo male h/o copd comes in with one day of worsening sob and wheezing. Has alb inh at home but not nebulizer. No fevers. No cough. Was hospitalized in march 2013 had to be emergently intubated with bad pna and quit smoking after that hospitlaization. Since then has been well and has not requried any abx or steroids. Not on oxygen at home. Feels better after several nebs and steroidss in the ed.   Hospital Course:  The stoma was admitted to the hospital with worsening shortness of breath and wheezing. He is on have COPD exacerbation. He was treated empirically with steroids, nebulizer treatments, antibiotics. With conservative management patient improved significantly. He was able to be weaned off of oxygen and his wheezing has resolved. He is ambulating in a hall and maintaining oxygen saturations above 90%. He reports feeling better than he has a long time. He has a nebulizer at home and is requesting albuterol. We'll give him a prescription for albuterol as well as prednisone taper. He will complete a course of antibiotics. He was offered home health therapy for COPD management, but has declined. He recently quit smoking and was encouraged to continue the same. Patient is ready for discharge home and will follow up with his primary  care physician.  Procedures:  None  Consultations:  None  Discharge Exam: Filed Vitals:   10/04/11 0449  BP: 133/76  Pulse: 86  Temp: 98.2 F (36.8 C)  Resp: 20   Filed Vitals:   10/03/11 2248 10/04/11 0449 10/04/11 0814 10/04/11 0906  BP: 138/67 133/76    Pulse: 88 86    Temp: 98.2 F (36.8 C) 98.2 F (36.8 C)    TempSrc: Oral Oral    Resp: 20 20    Height:      Weight:      SpO2: 93% 90% 90% 91%    General: No acute distress Cardiovascular: S1, S2, regular rate and rhythm Respiratory: No wheezes, good bilateral air entry  Discharge Instructions   Medication List  As of 10/04/2011  7:22 PM   TAKE these medications         albuterol 108 (90 BASE) MCG/ACT inhaler   Commonly known as: PROVENTIL HFA;VENTOLIN HFA   Inhale 2 puffs into the lungs every 6 (six) hours as needed. For shortness of breath      albuterol (2.5 MG/3ML) 0.083% nebulizer solution   Commonly known as: PROVENTIL   Take 3 mLs (2.5 mg total) by nebulization every 6 (six) hours as needed for wheezing.      ALPRAZolam 0.5 MG tablet   Commonly known as: XANAX   Take 0.5 mg by mouth at bedtime as needed. For sleep      guaiFENesin 600 MG 12 hr tablet   Commonly known as: MUCINEX   Take 2 tablets (1,200 mg total) by mouth 2 (two) times  daily.      lisinopril 20 MG tablet   Commonly known as: PRINIVIL,ZESTRIL   Take 20 mg by mouth daily.      omeprazole 20 MG capsule   Commonly known as: PRILOSEC   Take 20 mg by mouth daily.      predniSONE 10 MG tablet   Commonly known as: DELTASONE   Take 4 tablets po daily for 2 days then 3 tablets po daily for 2 days then 2 tablets po daily for 2 days then 1 tablet po daily for 2 days then stop      sulfamethoxazole-trimethoprim 800-160 MG per tablet   Commonly known as: BACTRIM DS   Take 1 tablet by mouth every 12 (twelve) hours.      tiotropium 18 MCG inhalation capsule   Commonly known as: SPIRIVA   Place 18 mcg into inhaler and inhale daily  as needed. For shortness of breath      traZODone 50 MG tablet   Commonly known as: DESYREL   Take 50 mg by mouth at bedtime.           Follow-up Information    Follow up with Kirk Ruths, MD on 10/11/2011. (appointment time at 10:00AM)    Contact information:   [address] [place] [address] [place] [address] [phone]           The results of significant diagnostics from this hospitalization (including imaging, microbiology, ancillary and laboratory) are listed below for reference.    Significant Diagnostic Studies: Dg Chest 2 View  10/01/2011  *RADIOLOGY REPORT*  Clinical Data: Shortness of breath.  Nonproductive cough.  CHEST - 2 VIEW  Comparison: 04/25/2011  Findings: Interval removal of endotracheal tube.  Normal heart size and pulmonary vascularity.  Diffuse emphysematous changes and fibrosis in the lungs.  No focal airspace consolidation.  No edema. No blunting of costophrenic angles.  No pneumothorax.  Mediastinal contours appear intact.  Calcification of the aorta.  Degenerative changes in the spine.  IMPRESSION: Emphysematous changes and scattered fibrosis in the lungs.  No evidence of active pulmonary disease.   Original Report Authenticated By: Marlon Pel, M.D.     Microbiology: Recent Results (from the past 240 hour(s))  MRSA PCR SCREENING     Status: Normal   Collection Time   10/02/11  5:49 AM      Component Value Range Status Comment   MRSA by PCR NEGATIVE  NEGATIVE Final      Labs: Basic Metabolic Panel:  Lab 10/03/11 9629 10/02/11 0437 10/02/11 0106  NA 138 137 134*  K 4.5 3.9 3.9  CL 98 98 96  CO2 34* 32 32  GLUCOSE 148* 146* 113*  BUN 20 18 19   CREATININE 0.93 0.91 0.91  CALCIUM 9.7 9.7 9.3  MG -- -- --  PHOS -- -- --   Liver Function Tests: No results found for this basename: AST:5,ALT:5,ALKPHOS:5,BILITOT:5,PROT:5,ALBUMIN:5 in the last 168 hours No results found for this basename: LIPASE:5,AMYLASE:5  in the last 168 hours No results found for this basename: AMMONIA:5 in the last 168 hours CBC:  Lab 10/03/11 0525 10/02/11 0437 10/02/11 0106  WBC 12.8* 7.5 9.1  NEUTROABS -- -- --  HGB 14.7 14.8 13.9  HCT 44.3 44.2 41.5  MCV 94.3 92.5 92.6  PLT 201 171 172   Cardiac Enzymes: No results found for this basename: CKTOTAL:5,CKMB:5,CKMBINDEX:5,TROPONINI:5 in the last 168 hours BNP: BNP (last 3 results) No results found for this basename: PROBNP:3  in the last 8760 hours CBG: No results found for this basename: GLUCAP:5 in the last 168 hours  Time coordinating discharge: Greater than 30 minutes  Signed:  Shaunee Mulkern  [facility] 10/04/2011, 7:22 PM

## 2011-10-04 NOTE — Progress Notes (Signed)
Patient ambulated in hallway without difficulty,room air sat 91% B/P 117/62,hr 98. No c/o pain or discomfort noted.

## 2012-05-12 ENCOUNTER — Ambulatory Visit (INDEPENDENT_AMBULATORY_CARE_PROVIDER_SITE_OTHER): Payer: Medicare Other | Admitting: Surgery

## 2012-05-12 ENCOUNTER — Encounter (INDEPENDENT_AMBULATORY_CARE_PROVIDER_SITE_OTHER): Payer: Self-pay | Admitting: Surgery

## 2012-05-12 VITALS — BP 152/80 | HR 84 | Resp 18 | Ht 73.0 in | Wt 153.0 lb

## 2012-05-12 DIAGNOSIS — K4090 Unilateral inguinal hernia, without obstruction or gangrene, not specified as recurrent: Secondary | ICD-10-CM

## 2012-05-12 NOTE — Progress Notes (Signed)
Patient ID: Shane Todd, male   DOB: 01-07-1946, 67 y.o.   MRN: [id number]  Chief Complaint  Patient presents with  . Other    Eval LIH    HPI Shane Todd is a 67 y.o. male.   HPI This is a pleasant gentleman who is a self-referral for evaluation of a symptomatic left inguinal hernia. He has had it for several years but is now being larger. He is having increasing discomfort and constipation. He does report that it always easily reduces. He is otherwise without complaints. Past Medical History  Diagnosis Date  . COPD (chronic obstructive pulmonary disease)   . Hypertension   . Tobacco abuse   . Anxiety   . Anemia 04/24/2011    History reviewed. No pertinent past surgical history.  Family History  Problem Relation Age of Onset  . Stroke Mother   . Deep vein thrombosis Father   . Lung cancer Sister   . Stroke Sister   . Hypertension Sister   . Heart disease Brother   . Stroke Brother   . Stroke Sister   . Heart disease Sister     Social History History  Substance Use Topics  . Smoking status: Former Smoker -- 0.50 packs/day    Quit date: 05/22/2011  . Smokeless tobacco: Not on file  . Alcohol Use: No    No Known Allergies  Current Outpatient Prescriptions  Medication Sig Dispense Refill  . albuterol (PROVENTIL HFA;VENTOLIN HFA) 108 (90 BASE) MCG/ACT inhaler Inhale 2 puffs into the lungs every 6 (six) hours as needed. For shortness of breath      . albuterol (PROVENTIL) (2.5 MG/3ML) 0.083% nebulizer solution Take 3 mLs (2.5 mg total) by nebulization every 6 (six) hours as needed for wheezing.  75 mL  12  . guaiFENesin (MUCINEX) 600 MG 12 hr tablet Take 2 tablets (1,200 mg total) by mouth 2 (two) times daily.  14 tablet  0  . lisinopril (PRINIVIL,ZESTRIL) 20 MG tablet Take 20 mg by mouth daily.      Marland Kitchen omeprazole (PRILOSEC) 20 MG capsule Take 20 mg by mouth daily.      . traZODone (DESYREL) 50 MG tablet Take 50 mg by mouth at bedtime.      . ALPRAZolam (XANAX)  0.5 MG tablet Take 0.5 mg by mouth at bedtime as needed. For sleep      . predniSONE (DELTASONE) 10 MG tablet Take 4 tablets po daily for 2 days then 3 tablets po daily for 2 days then 2 tablets po daily for 2 days then 1 tablet po daily for 2 days then stop  20 tablet  0  . tiotropium (SPIRIVA) 18 MCG inhalation capsule Place 18 mcg into inhaler and inhale daily as needed. For shortness of breath       No current facility-administered medications for this visit.    Review of Systems Review of Systems  Constitutional: Negative for fever, chills and unexpected weight change.  HENT: Negative for hearing loss, congestion, sore throat, trouble swallowing and voice change.   Eyes: Negative for visual disturbance.  Respiratory: Positive for shortness of breath. Negative for cough and wheezing.   Cardiovascular: Negative for chest pain, palpitations and leg swelling.  Gastrointestinal: Positive for abdominal pain and constipation. Negative for nausea, vomiting, diarrhea, blood in stool, abdominal distention, anal bleeding and rectal pain.  Genitourinary: Negative for hematuria and difficulty urinating.  Musculoskeletal: Negative for arthralgias.  Skin: Negative for rash and wound.  Neurological: Negative for  seizures, syncope, weakness and headaches.  Hematological: Negative for adenopathy. Does not bruise/bleed easily.  Psychiatric/Behavioral: Negative for confusion.    Blood pressure 152/80, pulse 84, resp. rate 18, height 6\' 1"  (1.854 m), weight 153 lb (69.4 kg).  Physical Exam Physical Exam  Constitutional: He is oriented to person, place, and time. He appears well-developed and well-nourished. No distress.  HENT:  Head: Normocephalic and atraumatic.  Right Ear: External ear normal.  Left Ear: External ear normal.  Nose: Nose normal.  Mouth/Throat: Oropharynx is clear and moist. No oropharyngeal exudate.  Eyes: Conjunctivae are normal. Right eye exhibits no discharge. Left eye exhibits  no discharge. No scleral icterus.  Neck: Normal range of motion. Neck supple. No tracheal deviation present. No thyromegaly present.  Cardiovascular: Normal rate, regular rhythm, normal heart sounds and intact distal pulses.   No murmur heard. Pulmonary/Chest: Effort normal. He has wheezes. He has rales.  Abdominal: Soft. Bowel sounds are normal. He exhibits no distension. There is no rebound.  There is a large, easily reducible left inguinal hernia. I cannot palpate an umbilical hernia or right inguinal hernia  Musculoskeletal: Normal range of motion. He exhibits no edema and no tenderness.  Lymphadenopathy:    He has no cervical adenopathy.  Neurological: He is alert and oriented to person, place, and time.  Skin: Skin is warm and dry. No rash noted. No erythema.  Psychiatric: His behavior is normal. Judgment normal.    Data Reviewed   Assessment    Symptomatic  Left inguinal hernia     Plan    Repair with mesh was recommended. Given his pulmonary status, I am worried that if the hernia became emergent that he would require prolonged intubation. I could perform the open surgery with a TAP block and MAC anesthesia. I discussed the risks which includes but is not limited to bleeding, infection, nerve entrapment, chronic pain, and recurrence. He understands and wishes to proceed. Surgery will be scheduled. Likelihood of success is good        Jaleeya Mcnelly A 05/12/2012, 10:56 AM

## 2012-05-28 ENCOUNTER — Encounter (HOSPITAL_COMMUNITY): Payer: Self-pay | Admitting: Pharmacy Technician

## 2012-05-29 ENCOUNTER — Encounter (HOSPITAL_COMMUNITY): Payer: Self-pay

## 2012-05-29 ENCOUNTER — Encounter (HOSPITAL_COMMUNITY)
Admission: RE | Admit: 2012-05-29 | Discharge: 2012-05-29 | Disposition: A | Discharge status: Home / Self Care | Payer: Medicare Other | Source: Ambulatory Visit | Attending: Surgery | Admitting: Surgery

## 2012-05-29 HISTORY — DX: Constipation, unspecified: K59.00

## 2012-05-29 HISTORY — DX: Pneumonia, unspecified organism: J18.9

## 2012-05-29 HISTORY — DX: Shortness of breath: R06.02

## 2012-05-29 HISTORY — DX: Dermatitis, unspecified: L30.9

## 2012-05-29 HISTORY — DX: Calculus of kidney: N20.0

## 2012-05-29 HISTORY — DX: Gastro-esophageal reflux disease without esophagitis: K21.9

## 2012-05-29 LAB — BASIC METABOLIC PANEL
CO2: 35 mEq/L — ABNORMAL HIGH (ref 19–32)
Calcium: 9.4 mg/dL (ref 8.4–10.5)
GFR calc non Af Amer: 71 mL/min — ABNORMAL LOW (ref >90)
Potassium: 4.4 mEq/L (ref 3.5–5.1)
Sodium: 137 mEq/L (ref 135–145)

## 2012-05-29 LAB — CBC
MCV: 89.1 fL (ref 78.0–100.0)
Platelets: 183 10*3/uL (ref 150–400)
RBC: 4.68 MIL/uL (ref 4.22–5.81)
WBC: 6.3 10*3/uL (ref 4.0–10.5)

## 2012-05-29 LAB — SURGICAL PCR SCREEN: Staphylococcus aureus: POSITIVE — AB

## 2012-05-29 NOTE — Pre-Procedure Instructions (Signed)
Shane Todd  05/29/2012   Your procedure is scheduled on:  Friday, April 25th.  Report to [facility] at 5:30AM.  Call this number if you have problems the morning of surgery: [phone]   Remember:   Do not eat food or drink liquids after midnight.   Take these medicines the morning of surgery with A SIP OF WATER:  omeprazole (PRILOSEC) Use inhalers and bring Albuterol inhaler to the hospital with you.   Do not wear jewelry, make-up or nail polish.  Do not wear lotions, powders, or perfumes. You may wear deodorant.   Men may shave face and neck.  Do not bring valuables to the hospital.  Contacts, dentures or bridgework may not be worn into surgery.  Leave suitcase in the car. After surgery it may be brought to your room.  For patients admitted to the hospital, checkout time is 11:00 AM the day of  discharge.    Special Instructions: Shower using CHG 2 nights before surgery and the night before surgery.  If you shower the day of surgery use CHG.  Use special wash - you have one bottle of CHG for all showers.  You should use approximately 1/3 of the bottle for each shower.   Please read over the following fact sheets that you were given: Pain Booklet, Coughing and Deep Breathing and Surgical Site Infection Prevention

## 2012-06-05 MED ORDER — CEFAZOLIN SODIUM-DEXTROSE 2-3 GM-% IV SOLR
2.0000 g | INTRAVENOUS | Status: AC
  Administered 2012-06-06: 2 g via INTRAVENOUS
  Filled 2012-06-05: qty 50

## 2012-06-05 NOTE — H&P (Signed)
Patient ID: Shane Todd, male DOB: [birth year], 67 y.o. MRN: [id number]  Chief Complaint   Patient presents with   .  Other     Eval [facility]   HPI  Shane Todd is a 67 y.o. male.  HPI  This is a pleasant gentleman who is a self-referral for evaluation of a symptomatic left inguinal hernia. He has had it for several years but is now being larger. He is having increasing discomfort and constipation. He does report that it always easily reduces. He is otherwise without complaints.  Past Medical History   Diagnosis  Date   .  COPD (chronic obstructive pulmonary disease)    .  Hypertension    .  Tobacco abuse    .  Anxiety    .  Anemia  04/24/2011   History reviewed. No pertinent past surgical history.  Family History   Problem  Relation  Age of Onset   .  Stroke  Mother    .  Deep vein thrombosis  Father    .  Lung cancer  Sister    .  Stroke  Sister    .  Hypertension  Sister    .  Heart disease  Brother    .  Stroke  Brother    .  Stroke  Sister    .  Heart disease  Sister    Social History  History   Substance Use Topics   .  Smoking status:  Former Smoker -- 0.50 packs/day     Quit date:  05/22/2011   .  Smokeless tobacco:  Not on file   .  Alcohol Use:  No   No Known Allergies  Current Outpatient Prescriptions   Medication  Sig  Dispense  Refill   .  albuterol (PROVENTIL HFA;VENTOLIN HFA) 108 (90 BASE) MCG/ACT inhaler  Inhale 2 puffs into the lungs every 6 (six) hours as needed. For shortness of breath     .  albuterol (PROVENTIL) (2.5 MG/3ML) 0.083% nebulizer solution  Take 3 mLs (2.5 mg total) by nebulization every 6 (six) hours as needed for wheezing.  75 mL  12   .  guaiFENesin (MUCINEX) 600 MG 12 hr tablet  Take 2 tablets (1,200 mg total) by mouth 2 (two) times daily.  14 tablet  0   .  lisinopril (PRINIVIL,ZESTRIL) 20 MG tablet  Take 20 mg by mouth daily.     Marland Kitchen  omeprazole (PRILOSEC) 20 MG capsule  Take 20 mg by mouth daily.     .  traZODone (DESYREL) 50 MG tablet   Take 50 mg by mouth at bedtime.     .  ALPRAZolam (XANAX) 0.5 MG tablet  Take 0.5 mg by mouth at bedtime as needed. For sleep     .  predniSONE (DELTASONE) 10 MG tablet  Take 4 tablets po daily for 2 days then 3 tablets po daily for 2 days then 2 tablets po daily for 2 days then 1 tablet po daily for 2 days then stop  20 tablet  0   .  tiotropium (SPIRIVA) 18 MCG inhalation capsule  Place 18 mcg into inhaler and inhale daily as needed. For shortness of breath      No current facility-administered medications for this visit.   Review of Systems  Review of Systems  Constitutional: Negative for fever, chills and unexpected weight change.  HENT: Negative for hearing loss, congestion, sore throat, trouble swallowing and voice change.  Eyes: Negative  for visual disturbance.  Respiratory: Positive for shortness of breath. Negative for cough and wheezing.  Cardiovascular: Negative for chest pain, palpitations and leg swelling.  Gastrointestinal: Positive for abdominal pain and constipation. Negative for nausea, vomiting, diarrhea, blood in stool, abdominal distention, anal bleeding and rectal pain.  Genitourinary: Negative for hematuria and difficulty urinating.  Musculoskeletal: Negative for arthralgias.  Skin: Negative for rash and wound.  Neurological: Negative for seizures, syncope, weakness and headaches.  Hematological: Negative for adenopathy. Does not bruise/bleed easily.  Psychiatric/Behavioral: Negative for confusion.  Blood pressure 152/80, pulse 84, resp. rate 18, height 6\' 1"  (1.854 m), weight 153 lb (69.4 kg).  Physical Exam  Physical Exam  Constitutional: He is oriented to person, place, and time. He appears well-developed and well-nourished. No distress.  HENT:  Head: Normocephalic and atraumatic.  Right Ear: External ear normal.  Left Ear: External ear normal.  Nose: Nose normal.  Mouth/Throat: Oropharynx is clear and moist. No oropharyngeal exudate.  Eyes: Conjunctivae are  normal. Right eye exhibits no discharge. Left eye exhibits no discharge. No scleral icterus.  Neck: Normal range of motion. Neck supple. No tracheal deviation present. No thyromegaly present.  Cardiovascular: Normal rate, regular rhythm, normal heart sounds and intact distal pulses.  No murmur heard.  Pulmonary/Chest: Effort normal. He has wheezes. He has rales.  Abdominal: Soft. Bowel sounds are normal. He exhibits no distension. There is no rebound.  There is a large, easily reducible left inguinal hernia. I cannot palpate an umbilical hernia or right inguinal hernia  Musculoskeletal: Normal range of motion. He exhibits no edema and no tenderness.  Lymphadenopathy:  He has no cervical adenopathy.  Neurological: He is alert and oriented to person, place, and time.  Skin: Skin is warm and dry. No rash noted. No erythema.  Psychiatric: His behavior is normal. Judgment normal.  Data Reviewed  Assessment  Symptomatic Left inguinal hernia  Plan  Repair with mesh was recommended. Given his pulmonary status, I am worried that if the hernia became emergent that he would require prolonged intubation. I could perform the open surgery with a TAP block and MAC anesthesia. I discussed the risks which includes but is not limited to bleeding, infection, nerve entrapment, chronic pain, and recurrence. He understands and wishes to proceed. Surgery will be scheduled. Likelihood of success is good

## 2012-06-06 ENCOUNTER — Encounter (HOSPITAL_COMMUNITY)
Admission: RE | Disposition: A | Discharge status: Home / Self Care | Payer: Self-pay | Source: Ambulatory Visit | Attending: Surgery

## 2012-06-06 ENCOUNTER — Ambulatory Visit (HOSPITAL_COMMUNITY): Payer: Medicare Other | Admitting: Anesthesiology

## 2012-06-06 ENCOUNTER — Encounter (HOSPITAL_COMMUNITY): Payer: Self-pay | Admitting: *Deleted

## 2012-06-06 ENCOUNTER — Encounter (HOSPITAL_COMMUNITY): Payer: Self-pay | Admitting: Anesthesiology

## 2012-06-06 ENCOUNTER — Observation Stay (HOSPITAL_COMMUNITY)
Admission: RE | Admit: 2012-06-06 | Discharge: 2012-06-06 | Disposition: A | Discharge status: Home / Self Care | Payer: Medicare Other | Source: Ambulatory Visit | Attending: Surgery | Admitting: Surgery

## 2012-06-06 DIAGNOSIS — K4090 Unilateral inguinal hernia, without obstruction or gangrene, not specified as recurrent: Principal | ICD-10-CM | POA: Insufficient documentation

## 2012-06-06 DIAGNOSIS — J449 Chronic obstructive pulmonary disease, unspecified: Secondary | ICD-10-CM | POA: Insufficient documentation

## 2012-06-06 DIAGNOSIS — I10 Essential (primary) hypertension: Secondary | ICD-10-CM | POA: Insufficient documentation

## 2012-06-06 DIAGNOSIS — Z01812 Encounter for preprocedural laboratory examination: Secondary | ICD-10-CM | POA: Insufficient documentation

## 2012-06-06 DIAGNOSIS — Z01810 Encounter for preprocedural cardiovascular examination: Secondary | ICD-10-CM | POA: Insufficient documentation

## 2012-06-06 DIAGNOSIS — J4489 Other specified chronic obstructive pulmonary disease: Secondary | ICD-10-CM | POA: Insufficient documentation

## 2012-06-06 HISTORY — PX: HERNIA REPAIR: SHX51

## 2012-06-06 HISTORY — PX: INSERTION OF MESH: SHX5868

## 2012-06-06 HISTORY — PX: INGUINAL HERNIA REPAIR: SHX194

## 2012-06-06 SURGERY — REPAIR, HERNIA, INGUINAL, ADULT
Anesthesia: General | Laterality: Left | Wound class: Clean

## 2012-06-06 MED ORDER — SODIUM CHLORIDE 0.9 % IV SOLN
INTRAVENOUS | Status: DC
  Administered 2012-06-06: 12:00:00 via INTRAVENOUS

## 2012-06-06 MED ORDER — TRAZODONE HCL 50 MG PO TABS: 50.0000 mg | ORAL_TABLET | Freq: Every day | ORAL | Status: DC

## 2012-06-06 MED ORDER — MORPHINE SULFATE 4 MG/ML IJ SOLN: 4.0000 mg | INTRAMUSCULAR | Status: DC | PRN

## 2012-06-06 MED ORDER — PHENYLEPHRINE HCL 10 MG/ML IJ SOLN
INTRAMUSCULAR | Status: DC | PRN
  Administered 2012-06-06 (×2): 80 ug via INTRAVENOUS

## 2012-06-06 MED ORDER — ALBUTEROL SULFATE HFA 108 (90 BASE) MCG/ACT IN AERS: 2.0000 | INHALATION_SPRAY | Freq: Four times a day (QID) | RESPIRATORY_TRACT | Status: DC | PRN

## 2012-06-06 MED ORDER — PROPOFOL 10 MG/ML IV BOLUS
INTRAVENOUS | Status: DC | PRN
  Administered 2012-06-06: 200 mg via INTRAVENOUS

## 2012-06-06 MED ORDER — PANTOPRAZOLE SODIUM 40 MG PO TBEC: 40.0000 mg | DELAYED_RELEASE_TABLET | Freq: Every day | ORAL | Status: DC

## 2012-06-06 MED ORDER — LACTATED RINGERS IV SOLN
INTRAVENOUS | Status: DC | PRN
  Administered 2012-06-06: 07:00:00 via INTRAVENOUS

## 2012-06-06 MED ORDER — OXYCODONE-ACETAMINOPHEN 5-325 MG PO TABS
ORAL_TABLET | ORAL | Status: AC
  Administered 2012-06-06: 1 via ORAL
  Filled 2012-06-06: qty 1

## 2012-06-06 MED ORDER — TIOTROPIUM BROMIDE MONOHYDRATE 18 MCG IN CAPS: 18.0000 ug | ORAL_CAPSULE | Freq: Every day | RESPIRATORY_TRACT | Status: DC | PRN

## 2012-06-06 MED ORDER — ONDANSETRON HCL 4 MG/2ML IJ SOLN
4.0000 mg | Freq: Four times a day (QID) | INTRAMUSCULAR | Status: DC | PRN
  Administered 2012-06-06: 4 mg via INTRAVENOUS
  Filled 2012-06-06: qty 2

## 2012-06-06 MED ORDER — DEXTROSE 5 % IV SOLN
INTRAVENOUS | Status: DC | PRN
  Administered 2012-06-06: 08:00:00 via INTRAVENOUS

## 2012-06-06 MED ORDER — OXYCODONE-ACETAMINOPHEN 5-325 MG PO TABS: 1.0000 | ORAL_TABLET | ORAL | Status: DC | PRN

## 2012-06-06 MED ORDER — LIDOCAINE HCL (CARDIAC) 20 MG/ML IV SOLN
INTRAVENOUS | Status: DC | PRN
  Administered 2012-06-06: 100 mg via INTRAVENOUS

## 2012-06-06 MED ORDER — FENTANYL CITRATE 0.05 MG/ML IJ SOLN
INTRAMUSCULAR | Status: DC | PRN
  Administered 2012-06-06 (×2): 50 ug via INTRAVENOUS

## 2012-06-06 MED ORDER — ONDANSETRON HCL 4 MG PO TABS: 4.0000 mg | ORAL_TABLET | Freq: Four times a day (QID) | ORAL | Status: DC | PRN

## 2012-06-06 MED ORDER — ENOXAPARIN SODIUM 40 MG/0.4ML ~~LOC~~ SOLN: 40.0000 mg | SUBCUTANEOUS | Status: DC

## 2012-06-06 MED ORDER — MIDAZOLAM HCL 5 MG/5ML IJ SOLN
INTRAMUSCULAR | Status: DC | PRN
  Administered 2012-06-06: 1 mg via INTRAVENOUS

## 2012-06-06 MED ORDER — ONDANSETRON HCL 4 MG/2ML IJ SOLN
INTRAMUSCULAR | Status: DC | PRN
  Administered 2012-06-06: 4 mg via INTRAVENOUS

## 2012-06-06 MED ORDER — 0.9 % SODIUM CHLORIDE (POUR BTL) OPTIME
TOPICAL | Status: DC | PRN
  Administered 2012-06-06: 1000 mL

## 2012-06-06 MED ORDER — LISINOPRIL 20 MG PO TABS: 20.0000 mg | ORAL_TABLET | Freq: Every day | ORAL | Status: DC

## 2012-06-06 MED ORDER — BUPIVACAINE-EPINEPHRINE 0.5% -1:200000 IJ SOLN
INTRAMUSCULAR | Status: DC | PRN
  Administered 2012-06-06: 20 mL

## 2012-06-06 SURGICAL SUPPLY — 44 items
BENZOIN TINCTURE PRP APPL 2/3 (GAUZE/BANDAGES/DRESSINGS) ×2 IMPLANT
BLADE SURG 10 STRL SS (BLADE) ×2 IMPLANT
BLADE SURG 15 STRL LF DISP TIS (BLADE) ×1 IMPLANT
BLADE SURG 15 STRL SS (BLADE) ×1
BLADE SURG ROTATE 9660 (MISCELLANEOUS) ×2 IMPLANT
CHLORAPREP W/TINT 26ML (MISCELLANEOUS) ×2 IMPLANT
CLOTH BEACON ORANGE TIMEOUT ST (SAFETY) ×2 IMPLANT
COVER SURGICAL LIGHT HANDLE (MISCELLANEOUS) ×2 IMPLANT
DRAIN PENROSE 1/2X12 LTX STRL (WOUND CARE) ×2 IMPLANT
DRAPE LAPAROTOMY TRNSV 102X78 (DRAPE) ×2 IMPLANT
DRESSING TELFA 8X3 (GAUZE/BANDAGES/DRESSINGS) ×2 IMPLANT
DRSG TEGADERM 4X4.75 (GAUZE/BANDAGES/DRESSINGS) ×2 IMPLANT
ELECT CAUTERY BLADE 6.4 (BLADE) ×2 IMPLANT
ELECT REM PT RETURN 9FT ADLT (ELECTROSURGICAL) ×2
ELECTRODE REM PT RTRN 9FT ADLT (ELECTROSURGICAL) ×1 IMPLANT
GLOVE BIO SURGEON STRL SZ7.5 (GLOVE) ×2 IMPLANT
GLOVE BIOGEL PI IND STRL 7.0 (GLOVE) ×1 IMPLANT
GLOVE BIOGEL PI IND STRL 7.5 (GLOVE) ×2 IMPLANT
GLOVE BIOGEL PI INDICATOR 7.0 (GLOVE) ×1
GLOVE BIOGEL PI INDICATOR 7.5 (GLOVE) ×2
GLOVE ECLIPSE 6.5 STRL STRAW (GLOVE) ×2 IMPLANT
GLOVE SURG SIGNA 7.5 PF LTX (GLOVE) ×2 IMPLANT
GOWN PREVENTION PLUS XLARGE (GOWN DISPOSABLE) ×2 IMPLANT
GOWN STRL NON-REIN LRG LVL3 (GOWN DISPOSABLE) ×4 IMPLANT
KIT BASIN OR (CUSTOM PROCEDURE TRAY) ×2 IMPLANT
KIT ROOM TURNOVER OR (KITS) ×2 IMPLANT
MESH PARIETEX PROGRIP LEFT (Mesh General) ×2 IMPLANT
NEEDLE HYPO 25GX1X1/2 BEV (NEEDLE) ×2 IMPLANT
NS IRRIG 1000ML POUR BTL (IV SOLUTION) ×2 IMPLANT
PACK SURGICAL SETUP 50X90 (CUSTOM PROCEDURE TRAY) ×2 IMPLANT
PAD ARMBOARD 7.5X6 YLW CONV (MISCELLANEOUS) ×4 IMPLANT
PENCIL BUTTON HOLSTER BLD 10FT (ELECTRODE) ×2 IMPLANT
SPECIMEN JAR SMALL (MISCELLANEOUS) IMPLANT
SPONGE LAP 18X18 X RAY DECT (DISPOSABLE) ×2 IMPLANT
STRIP CLOSURE SKIN 1/2X4 (GAUZE/BANDAGES/DRESSINGS) ×2 IMPLANT
SUT MON AB 4-0 PC3 18 (SUTURE) ×2 IMPLANT
SUT SILK 2 0 SH (SUTURE) ×2 IMPLANT
SUT VIC AB 2-0 CT1 27 (SUTURE) ×2
SUT VIC AB 2-0 CT1 TAPERPNT 27 (SUTURE) ×2 IMPLANT
SUT VIC AB 3-0 CT1 27 (SUTURE) ×1
SUT VIC AB 3-0 CT1 TAPERPNT 27 (SUTURE) ×1 IMPLANT
SYR CONTROL 10ML LL (SYRINGE) ×2 IMPLANT
TOWEL OR 17X24 6PK STRL BLUE (TOWEL DISPOSABLE) ×2 IMPLANT
TOWEL OR 17X26 10 PK STRL BLUE (TOWEL DISPOSABLE) ×2 IMPLANT

## 2012-06-06 NOTE — Progress Notes (Signed)
Patient re-evaluated. Additional episode of brown emesis. 4 mg Zofran given. Sherlyn Lees, RN

## 2012-06-06 NOTE — Interval H&P Note (Signed)
History and Physical Interval Note: no change in H and P  06/06/2012 6:40 AM  Shane Todd  has presented today for surgery, with the diagnosis of left ingunial hernia  The various methods of treatment have been discussed with the patient and family. After consideration of risks, benefits and other options for treatment, the patient has consented to  Procedure(s): HERNIA REPAIR INGUINAL ADULT (Left) INSERTION OF MESH (Left) as a surgical intervention .  The patient's history has been reviewed, patient examined, no change in status, stable for surgery.  I have reviewed the patient's chart and labs.  Questions were answered to the patient's satisfaction.     [name] A

## 2012-06-06 NOTE — Progress Notes (Signed)
UR completed. Vergie Zahm RNBSN 

## 2012-06-06 NOTE — Progress Notes (Signed)
Patient ID: Shane Todd, male   DOB: [birth year], 67 y.o.   MRN: [id number] Doing well post op Now SOB or difficulty breathing Pain controlled sats good.  Will discharge home

## 2012-06-06 NOTE — Progress Notes (Signed)
Report given to Kevin Fenton, RN

## 2012-06-06 NOTE — Op Note (Signed)
HERNIA REPAIR INGUINAL ADULT, INSERTION OF MESH  Procedure Note  [name] 06/06/2012   Pre-op Diagnosis: left ingunial hernia     Post-op Diagnosis: same  Procedure(s): LEFT INGUINAL HERNIA REPAIR WITH MESH  Surgeon(s): Shelly Rubenstein, MD  Anesthesia: General  Staff:  Circulator: Sudie Bailey, RN Scrub Person: Janeece Agee Pingue, CST  Estimated Blood Loss: Minimal                         Shane Todd   Date: 06/06/2012  Time: 8:06 AM

## 2012-06-06 NOTE — Preoperative (Signed)
Beta Blockers   Reason not to administer Beta Blockers:Not Applicable 

## 2012-06-06 NOTE — Progress Notes (Signed)
Patient resting in bed. Side rails up. Call light within reach family at bedside wound noted. Patient being watched by Kevin Fenton, RN and myself.

## 2012-06-06 NOTE — Anesthesia Procedure Notes (Signed)
Procedure Name: LMA Insertion Date/Time: 06/06/2012 7:25 AM Performed by: Darcey Nora B Pre-anesthesia Checklist: Patient identified, Emergency Drugs available, Suction available and Patient being monitored Patient Re-evaluated:Patient Re-evaluated prior to inductionOxygen Delivery Method: Circle system utilized Preoxygenation: Pre-oxygenation with 100% oxygen Intubation Type: IV induction Ventilation: Mask ventilation without difficulty LMA: LMA inserted LMA Size: 4.0 Number of attempts: 1 Placement Confirmation: positive ETCO2 and breath sounds checked- equal and bilateral Tube secured with: taped across cheeks. Dental Injury: Teeth and Oropharynx as per pre-operative assessment

## 2012-06-06 NOTE — Progress Notes (Signed)
Spoke to Dr. Magnus Ivan regarding admission status due to patient voiding and pain being under control. Per Dr. Magnus Ivan patient will need to stay until at least this afternoon for reassessment. Notified bed control.

## 2012-06-06 NOTE — Progress Notes (Signed)
At 1115 patient arrived from short stay. Denies pain. Assessment done. At 1130, patient had small amount of brown emesis. Offered Zofran. Patient denied. IV fluids started. To start clear liquid diet. Will monitor patient. Sherlyn Lees, RN

## 2012-06-06 NOTE — Op Note (Signed)
NAMEKAGAN, MUTCHLER               ACCOUNT NO.:  [id number]  MEDICAL RECORD NO.:  [id number]  LOCATION:  [facility]                         FACILITY:  [facility]  PHYSICIAN:  Abigail Miyamoto, M.D. DATE OF BIRTH:  March 14, 1945  DATE OF PROCEDURE:  06/06/2012 DATE OF DISCHARGE:                              OPERATIVE REPORT   PREOPERATIVE DIAGNOSIS:  Left inguinal hernia.  POSTOPERATIVE DIAGNOSIS:  Left inguinal hernia.  PROCEDURE:  Left inguinal hernia repair with mesh.  SURGEON:  Abigail Miyamoto, M.D.  ANESTHESIA:  LMA and 0.5% Marcaine.  ESTIMATED BLOOD LOSS:  Minimal.  FINDINGS:  The patient was found to have a direct inguinal hernia as well as a very tiny indirect inguinal hernia which was repaired with a piece of  Parietex ProGrip Prolene mesh.  PROCEDURE IN DETAIL:  The patient was brought to the operating room, identified as Shane Todd.  He was placed supine on the operating room table.  General anesthesia was induced with LMA.  His abdomen was then prepped and draped in usual sterile fashion.  I performed a left ilioinguinal nerve block with Marcaine.  I then made a longitudinal incision on the groin with a scalpel.  I took this down through Scarpa's fascia with electrocautery.  The external oblique fascia was then identified and opened through the internal and external rings.  The testicular cord and structures were controlled with Penrose drain.  The patient had a large direct hernia defect.  I then dissected out the cord structures and found a very small indirect hernia sac.  I ligated the sac at the base with a silk suture.  I then excised the redundant sac. A piece of Parietex ProGrip mesh for the left side was brought onto the field.  I placed as an onlay on the inguinal floor and then it brought around the cord structures.  I then sewed it to the pubic tubercle with several interrupted 2-0 Vicryl sutures.  Good coverage of the inguinal floor appeared to be achieved.  I  then closed the external oblique fascia over top of this with a running 2-0 Vicryl suture.  I anesthetized the fascia further with Marcaine.  I then closed subcutaneous tissue with interrupted 3-0 Vicryl sutures and closed the skin with a running 4-0 Monocryl suture.  Steri-Strips, gauze, and Tegaderm were then applied.  The patient tolerated the procedure well.  All counts were correct at the end of the procedure.  The patient was then extubated in the operating room and taken in a stable condition to the recovery room.     Abigail Miyamoto, M.D.     DB/MEDQ  D:  06/06/2012  T:  06/06/2012  Job:  [id number]

## 2012-06-06 NOTE — Discharge Summary (Signed)
At 1545, discharge instructions and prescription given to patient and patient's wife. IV discontinued without incident. Patient clear on follow-up plans. No further episodes of nausea/emesis while here. Sherlyn Lees, RN

## 2012-06-06 NOTE — Anesthesia Postprocedure Evaluation (Signed)
Anesthesia Post Note  Patient: Shane Todd  Procedure(s) Performed: Procedure(s) (LRB): HERNIA REPAIR INGUINAL ADULT (Left) INSERTION OF MESH (Left)  Anesthesia type: general  Patient location: PACU  Post pain: Pain level controlled  Post assessment: Patient's Cardiovascular Status Stable  Last Vitals:  Filed Vitals:   06/06/12 0902  BP: 137/79  Pulse: 85  Temp: 36.2 C  Resp: 20    Post vital signs: Reviewed and stable  Level of consciousness: sedated  Complications: No apparent anesthesia complications

## 2012-06-06 NOTE — Transfer of Care (Signed)
Immediate Anesthesia Transfer of Care Note  Patient: Shane Todd  Procedure(s) Performed: Procedure(s): HERNIA REPAIR INGUINAL ADULT (Left) INSERTION OF MESH (Left)  Patient Location: PACU  Anesthesia Type:General  Level of Consciousness: awake, sedated and pateint uncooperative  Airway & Oxygen Therapy: Patient Spontanous Breathing and Patient connected to nasal cannula oxygen  Post-op Assessment: Report given to PACU RN and Post -op Vital signs reviewed and stable  Post vital signs: Reviewed and stable  Complications: No apparent anesthesia complications

## 2012-06-06 NOTE — Anesthesia Preprocedure Evaluation (Addendum)
Anesthesia Evaluation  Patient identified by MRN, date of birth, ID band Patient awake    Reviewed: Allergy & Precautions, H&P , NPO status , Patient's Chart, lab work & pertinent test results  Airway Mallampati: I TM Distance: >3 FB Neck ROM: Full    Dental  (+) Edentulous Upper, Edentulous Lower and Dental Advisory Given   Pulmonary shortness of breath and with exertion, COPD COPD inhaler, former smoker,    Pulmonary exam normal       Cardiovascular hypertension, Pt. on medications     Neuro/Psych PSYCHIATRIC DISORDERS Anxiety negative neurological ROS     GI/Hepatic Neg liver ROS, GERD-  Medicated,  Endo/Other  negative endocrine ROS  Renal/GU      Musculoskeletal   Abdominal   Peds  Hematology   Anesthesia Other Findings   Reproductive/Obstetrics                          Anesthesia Physical Anesthesia Plan  ASA: III  Anesthesia Plan: General   Post-op Pain Management:    Induction: Intravenous  Airway Management Planned: LMA  Additional Equipment:   Intra-op Plan:   Post-operative Plan: Extubation in OR  Informed Consent: I have reviewed the patients History and Physical, chart, labs and discussed the procedure including the risks, benefits and alternatives for the proposed anesthesia with the patient or authorized representative who has indicated his/her understanding and acceptance.   Dental advisory given  Plan Discussed with: CRNA, Anesthesiologist and Surgeon  Anesthesia Plan Comments:         Anesthesia Quick Evaluation

## 2012-06-09 ENCOUNTER — Encounter (HOSPITAL_COMMUNITY): Payer: Self-pay | Admitting: Surgery

## 2012-06-24 ENCOUNTER — Encounter (INDEPENDENT_AMBULATORY_CARE_PROVIDER_SITE_OTHER): Payer: Self-pay | Admitting: Surgery

## 2012-06-24 ENCOUNTER — Ambulatory Visit (INDEPENDENT_AMBULATORY_CARE_PROVIDER_SITE_OTHER): Payer: Medicare Other | Admitting: Surgery

## 2012-06-24 VITALS — BP 132/86 | HR 70 | Temp 98.3°F | Resp 16 | Ht 72.0 in | Wt 156.0 lb

## 2012-06-24 DIAGNOSIS — Z09 Encounter for follow-up examination after completed treatment for conditions other than malignant neoplasm: Secondary | ICD-10-CM

## 2012-06-24 NOTE — Progress Notes (Signed)
Subjective:     Patient ID: Shane Todd, male   DOB: 02/06/1946, 67 y.o.   MRN: [id number]  HPI He is here for his first postop visit status post left inguinal hernia repair with mesh.  He is doing well and has no complaints. He has minimal postop discomfort  Review of Systems     Objective:   Physical Exam On exam, there is minimal swelling and no evidence of recurrence or infection    Assessment:     Patient stable postop     Plan:     He will refrain from heavy lifting for 2 more weeks. He may then return to normal activity. I will see him back as needed

## 2012-09-17 ENCOUNTER — Other Ambulatory Visit: Payer: Self-pay

## 2012-12-18 ENCOUNTER — Other Ambulatory Visit: Payer: Self-pay

## 2013-07-02 ENCOUNTER — Emergency Department (HOSPITAL_COMMUNITY): Payer: Medicare Other

## 2013-07-02 ENCOUNTER — Encounter (HOSPITAL_COMMUNITY): Payer: Self-pay | Admitting: Emergency Medicine

## 2013-07-02 ENCOUNTER — Inpatient Hospital Stay (HOSPITAL_COMMUNITY): Payer: Medicare Other

## 2013-07-02 ENCOUNTER — Inpatient Hospital Stay (HOSPITAL_COMMUNITY)
Admission: EM | Admit: 2013-07-02 | Discharge: 2013-07-05 | DRG: 193 | Disposition: A | Discharge status: Home / Self Care | Payer: Medicare Other | Attending: Internal Medicine | Admitting: Internal Medicine

## 2013-07-02 DIAGNOSIS — F411 Generalized anxiety disorder: Secondary | ICD-10-CM | POA: Diagnosis present

## 2013-07-02 DIAGNOSIS — J9620 Acute and chronic respiratory failure, unspecified whether with hypoxia or hypercapnia: Secondary | ICD-10-CM | POA: Diagnosis present

## 2013-07-02 DIAGNOSIS — K4090 Unilateral inguinal hernia, without obstruction or gangrene, not specified as recurrent: Secondary | ICD-10-CM

## 2013-07-02 DIAGNOSIS — R0600 Dyspnea, unspecified: Secondary | ICD-10-CM | POA: Diagnosis present

## 2013-07-02 DIAGNOSIS — Z8249 Family history of ischemic heart disease and other diseases of the circulatory system: Secondary | ICD-10-CM

## 2013-07-02 DIAGNOSIS — K219 Gastro-esophageal reflux disease without esophagitis: Secondary | ICD-10-CM | POA: Diagnosis present

## 2013-07-02 DIAGNOSIS — J9602 Acute respiratory failure with hypercapnia: Secondary | ICD-10-CM

## 2013-07-02 DIAGNOSIS — Z720 Tobacco use: Secondary | ICD-10-CM | POA: Diagnosis present

## 2013-07-02 DIAGNOSIS — J9600 Acute respiratory failure, unspecified whether with hypoxia or hypercapnia: Secondary | ICD-10-CM

## 2013-07-02 DIAGNOSIS — F17200 Nicotine dependence, unspecified, uncomplicated: Secondary | ICD-10-CM | POA: Diagnosis present

## 2013-07-02 DIAGNOSIS — D649 Anemia, unspecified: Secondary | ICD-10-CM

## 2013-07-02 DIAGNOSIS — J189 Pneumonia, unspecified organism: Secondary | ICD-10-CM | POA: Diagnosis present

## 2013-07-02 DIAGNOSIS — G9341 Metabolic encephalopathy: Secondary | ICD-10-CM | POA: Diagnosis present

## 2013-07-02 DIAGNOSIS — J159 Unspecified bacterial pneumonia: Principal | ICD-10-CM | POA: Diagnosis present

## 2013-07-02 DIAGNOSIS — R0902 Hypoxemia: Secondary | ICD-10-CM

## 2013-07-02 DIAGNOSIS — I10 Essential (primary) hypertension: Secondary | ICD-10-CM | POA: Diagnosis present

## 2013-07-02 DIAGNOSIS — R000 Tachycardia, unspecified: Secondary | ICD-10-CM

## 2013-07-02 DIAGNOSIS — J9601 Acute respiratory failure with hypoxia: Secondary | ICD-10-CM

## 2013-07-02 DIAGNOSIS — F419 Anxiety disorder, unspecified: Secondary | ICD-10-CM

## 2013-07-02 DIAGNOSIS — G9340 Encephalopathy, unspecified: Secondary | ICD-10-CM | POA: Diagnosis present

## 2013-07-02 DIAGNOSIS — E860 Dehydration: Secondary | ICD-10-CM

## 2013-07-02 DIAGNOSIS — Z801 Family history of malignant neoplasm of trachea, bronchus and lung: Secondary | ICD-10-CM

## 2013-07-02 DIAGNOSIS — J441 Chronic obstructive pulmonary disease with (acute) exacerbation: Secondary | ICD-10-CM | POA: Diagnosis present

## 2013-07-02 DIAGNOSIS — R739 Hyperglycemia, unspecified: Secondary | ICD-10-CM

## 2013-07-02 DIAGNOSIS — Z823 Family history of stroke: Secondary | ICD-10-CM

## 2013-07-02 DIAGNOSIS — E46 Unspecified protein-calorie malnutrition: Secondary | ICD-10-CM | POA: Diagnosis present

## 2013-07-02 DIAGNOSIS — Z8701 Personal history of pneumonia (recurrent): Secondary | ICD-10-CM

## 2013-07-02 DIAGNOSIS — I509 Heart failure, unspecified: Secondary | ICD-10-CM

## 2013-07-02 HISTORY — DX: Acute respiratory failure with hypoxia: J96.01

## 2013-07-02 HISTORY — DX: Acute respiratory failure with hypercapnia: J96.02

## 2013-07-02 LAB — BASIC METABOLIC PANEL
BUN: 32 mg/dL — ABNORMAL HIGH (ref 6–23)
CHLORIDE: 94 meq/L — AB (ref 96–112)
CO2: 40 mEq/L (ref 19–32)
Calcium: 8.9 mg/dL (ref 8.4–10.5)
Creatinine, Ser: 0.78 mg/dL (ref 0.50–1.35)
GFR calc non Af Amer: >90 mL/min (ref >90)
Glucose, Bld: 101 mg/dL — ABNORMAL HIGH (ref 70–99)
POTASSIUM: 4.1 meq/L (ref 3.7–5.3)
Sodium: 140 mEq/L (ref 137–147)

## 2013-07-02 LAB — CBC WITH DIFFERENTIAL/PLATELET
BASOS ABS: 0 10*3/uL (ref 0.0–0.1)
BASOS PCT: 0 % (ref 0–1)
Eosinophils Absolute: 0.1 10*3/uL (ref 0.0–0.7)
Eosinophils Relative: 2 % (ref 0–5)
HCT: 43.1 % (ref 39.0–52.0)
HEMOGLOBIN: 13.1 g/dL (ref 13.0–17.0)
LYMPHS PCT: 7 % — AB (ref 12–46)
Lymphs Abs: 0.6 10*3/uL — ABNORMAL LOW (ref 0.7–4.0)
MCH: 30.2 pg (ref 26.0–34.0)
MCHC: 30.4 g/dL (ref 30.0–36.0)
MCV: 99.3 fL (ref 78.0–100.0)
MONOS PCT: 10 % (ref 3–12)
Monocytes Absolute: 0.8 10*3/uL (ref 0.1–1.0)
NEUTROS ABS: 7.1 10*3/uL (ref 1.7–7.7)
NEUTROS PCT: 81 % — AB (ref 43–77)
Platelets: 334 10*3/uL (ref 150–400)
RBC: 4.34 MIL/uL (ref 4.22–5.81)
RDW: 12.9 % (ref 11.5–15.5)
WBC: 8.7 10*3/uL (ref 4.0–10.5)

## 2013-07-02 LAB — BLOOD GAS, ARTERIAL
Acid-Base Excess: 8.4 mmol/L — ABNORMAL HIGH (ref 0.0–2.0)
BICARBONATE: 34.5 meq/L — AB (ref 20.0–24.0)
DRAWN BY: 22223
O2 CONTENT: 2 L/min
O2 Saturation: 95.2 %
PATIENT TEMPERATURE: 37
PH ART: 7.322 — AB (ref 7.350–7.450)
TCO2: 31.3 mmol/L (ref 0–100)
pCO2 arterial: 68.5 mmHg (ref 35.0–45.0)
pO2, Arterial: 77.6 mmHg — ABNORMAL LOW (ref 80.0–100.0)

## 2013-07-02 LAB — TROPONIN I

## 2013-07-02 LAB — LACTIC ACID, PLASMA: Lactic Acid, Venous: 0.8 mmol/L (ref 0.5–2.2)

## 2013-07-02 LAB — PRO B NATRIURETIC PEPTIDE: PRO B NATRI PEPTIDE: 726.2 pg/mL — AB (ref 0–125)

## 2013-07-02 MED ORDER — LEVOFLOXACIN IN D5W 750 MG/150ML IV SOLN
750.0000 mg | Freq: Once | INTRAVENOUS | Status: AC
  Administered 2013-07-02: 750 mg via INTRAVENOUS
  Filled 2013-07-02: qty 150

## 2013-07-02 MED ORDER — ENOXAPARIN SODIUM 40 MG/0.4ML ~~LOC~~ SOLN
40.0000 mg | SUBCUTANEOUS | Status: DC
  Administered 2013-07-03 – 2013-07-05 (×3): 40 mg via SUBCUTANEOUS
  Filled 2013-07-02 (×3): qty 0.4

## 2013-07-02 MED ORDER — LEVOFLOXACIN IN D5W 750 MG/150ML IV SOLN
750.0000 mg | INTRAVENOUS | Status: DC
  Administered 2013-07-03 – 2013-07-04 (×2): 750 mg via INTRAVENOUS
  Filled 2013-07-02 (×2): qty 150

## 2013-07-02 MED ORDER — ALBUTEROL SULFATE (2.5 MG/3ML) 0.083% IN NEBU
2.5000 mg | INHALATION_SOLUTION | RESPIRATORY_TRACT | Status: DC | PRN
Start: 2013-07-02 — End: 2013-07-05

## 2013-07-02 MED ORDER — TRAZODONE HCL 50 MG PO TABS
50.0000 mg | ORAL_TABLET | Freq: Every day | ORAL | Status: DC
  Administered 2013-07-02: 50 mg via ORAL
  Filled 2013-07-02: qty 1

## 2013-07-02 MED ORDER — ALBUTEROL SULFATE (2.5 MG/3ML) 0.083% IN NEBU: 2.5000 mg | INHALATION_SOLUTION | RESPIRATORY_TRACT | Status: DC

## 2013-07-02 MED ORDER — ALBUTEROL (5 MG/ML) CONTINUOUS INHALATION SOLN
10.0000 mg/h | INHALATION_SOLUTION | Freq: Once | RESPIRATORY_TRACT | Status: AC
  Administered 2013-07-02: 10 mg/h via RESPIRATORY_TRACT
  Filled 2013-07-02: qty 20

## 2013-07-02 MED ORDER — FUROSEMIDE 10 MG/ML IJ SOLN
40.0000 mg | Freq: Once | INTRAMUSCULAR | Status: DC
  Filled 2013-07-02: qty 4

## 2013-07-02 MED ORDER — SODIUM CHLORIDE 0.9 % IV SOLN
INTRAVENOUS | Status: DC
  Administered 2013-07-02 – 2013-07-04 (×3): via INTRAVENOUS

## 2013-07-02 MED ORDER — NICOTINE 21 MG/24HR TD PT24
21.0000 mg | MEDICATED_PATCH | Freq: Every day | TRANSDERMAL | Status: DC
  Administered 2013-07-03 – 2013-07-05 (×3): 21 mg via TRANSDERMAL
  Filled 2013-07-02 (×3): qty 1

## 2013-07-02 MED ORDER — SODIUM CHLORIDE 0.9 % IV SOLN: INTRAVENOUS | Status: DC

## 2013-07-02 MED ORDER — GUAIFENESIN-DM 100-10 MG/5ML PO SYRP: 5.0000 mL | ORAL_SOLUTION | ORAL | Status: DC | PRN

## 2013-07-02 MED ORDER — ADULT MULTIVITAMIN W/MINERALS CH
1.0000 | ORAL_TABLET | Freq: Every day | ORAL | Status: DC
  Administered 2013-07-03 – 2013-07-05 (×3): 1 via ORAL
  Filled 2013-07-02 (×3): qty 1

## 2013-07-02 MED ORDER — METHYLPREDNISOLONE SODIUM SUCC 125 MG IJ SOLR
125.0000 mg | Freq: Once | INTRAMUSCULAR | Status: AC
  Administered 2013-07-02: 125 mg via INTRAVENOUS
  Filled 2013-07-02: qty 2

## 2013-07-02 MED ORDER — IPRATROPIUM-ALBUTEROL 0.5-2.5 (3) MG/3ML IN SOLN
3.0000 mL | RESPIRATORY_TRACT | Status: DC
  Administered 2013-07-03 (×5): 3 mL via RESPIRATORY_TRACT
  Filled 2013-07-02 (×7): qty 3

## 2013-07-02 MED ORDER — PREDNISONE 20 MG PO TABS: 50.0000 mg | ORAL_TABLET | Freq: Every day | ORAL | Status: DC

## 2013-07-02 MED ORDER — ALBUTEROL SULFATE HFA 108 (90 BASE) MCG/ACT IN AERS: 2.0000 | INHALATION_SPRAY | Freq: Four times a day (QID) | RESPIRATORY_TRACT | Status: DC | PRN

## 2013-07-02 MED ORDER — IPRATROPIUM BROMIDE 0.02 % IN SOLN
1.0000 mg | Freq: Once | RESPIRATORY_TRACT | Status: AC
  Administered 2013-07-02: 1 mg via RESPIRATORY_TRACT
  Filled 2013-07-02: qty 5

## 2013-07-02 MED ORDER — TIOTROPIUM BROMIDE MONOHYDRATE 18 MCG IN CAPS: 18.0000 ug | ORAL_CAPSULE | Freq: Every day | RESPIRATORY_TRACT | Status: DC

## 2013-07-02 NOTE — ED Provider Notes (Signed)
CSN: [id number]     Arrival date & time 07/02/13  1648 History   First MD Initiated Contact with Patient 07/02/13 1723     Chief Complaint  Patient presents with  . Shortness of Breath      HPI Pt was seen at 1725.  Per pt and his family, c/o gradual onset and worsening of persistent cough, wheezing and SOB for the past 1 week, worse over the past 3 to 4 days.  Describes his symptoms as "my COPD." Pt states he had similar symptoms last year and "was intubated in the ICU." Has been using home MDI without relief. Pt's family states pt has been intermittently "acting confused" for the past several days. Pt's family checked pt's O2 Sat today and it was "58% R/A" this morning. Pt was using his wife's home O2 N/C with improvement of his Sats "into the 90's." Pt continues to smoke cigarettes daily.  Denies CP/palpitations, no back pain, no abd pain, no N/V/D, no fevers, no rash.     Past Medical History  Diagnosis Date  . COPD (chronic obstructive pulmonary disease)   . Hypertension   . Tobacco abuse   . Anxiety   . Anemia 04/24/2011  . Pneumonia     hospitalized 04/2011  . Shortness of breath     with exertion  . GERD (gastroesophageal reflux disease)   . Constipation   . Eczema   . Kidney stones    Past Surgical History  Procedure Laterality Date  . Hemorrhoid surgery    . Colonoscopy    . Inguinal hernia repair Left 06/06/2012    Procedure: HERNIA REPAIR INGUINAL ADULT;  Surgeon: Shelly Rubensteinouglas A Blackman, MD;  Location: [facility] OR;  Service: General;  Laterality: Left;  . Insertion of mesh Left 06/06/2012    Procedure: INSERTION OF MESH;  Surgeon: Shelly Rubensteinouglas A Blackman, MD;  Location: [facility] OR;  Service: General;  Laterality: Left;  . Hernia repair Left 06/06/2012   Family History  Problem Relation Age of Onset  . Stroke Mother   . Deep vein thrombosis Father   . Lung cancer Sister   . Stroke Sister   . Hypertension Sister   . Heart disease Brother   . Stroke Brother   . Stroke Sister   .  Heart disease Sister    History  Substance Use Topics  . Smoking status: Current Every Day Smoker -- 0.50 packs/day    Last Attempt to Quit: 05/22/2011  . Smokeless tobacco: Not on file  . Alcohol Use: No    Review of Systems ROS: Statement: All systems negative except as marked or noted in the HPI; Constitutional: Negative for fever and chills. ; ; Eyes: Negative for eye pain, redness and discharge. ; ; ENMT: Negative for ear pain, hoarseness, nasal congestion, sinus pressure and sore throat. ; ; Cardiovascular: Negative for chest pain, palpitations, diaphoresis, and peripheral edema. ; ; Respiratory: +cough, wheezing, SOB. Negative for stridor. ; ; Gastrointestinal: Negative for nausea, vomiting, diarrhea, abdominal pain, blood in stool, hematemesis, jaundice and rectal bleeding. . ; ; Genitourinary: Negative for dysuria, flank pain and hematuria. ; ; Musculoskeletal: Negative for back pain and neck pain. Negative for swelling and trauma.; ; Skin: Negative for pruritus, rash, abrasions, blisters, bruising and skin lesion.; ; Neuro: +intermittent confusion. Negative for headache, lightheadedness and neck stiffness. Negative for weakness, altered level of consciousness , extremity weakness, paresthesias, involuntary movement, seizure and syncope.      Allergies  Oxycodone  Home Medications  Prior to Admission medications   Medication Sig Start Date End Date Taking? Authorizing Provider  albuterol (PROVENTIL HFA;VENTOLIN HFA) 108 (90 BASE) MCG/ACT inhaler Inhale 2 puffs into the lungs every 6 (six) hours as needed. For shortness of breath   Yes Historical Provider, MD  amLODipine (NORVASC) 5 MG tablet Take 5 mg by mouth every morning.   Yes Historical Provider, MD  lisinopril (PRINIVIL,ZESTRIL) 20 MG tablet Take 20 mg by mouth every morning.    Yes Historical Provider, MD  tiotropium (SPIRIVA) 18 MCG inhalation capsule Place 18 mcg into inhaler and inhale daily.   Yes Historical Provider, MD   traZODone (DESYREL) 50 MG tablet Take 50 mg by mouth at bedtime.   Yes Historical Provider, MD   BP 146/63  Pulse 90  Temp(Src) 98 F (36.7 C) (Oral)  Resp 16  SpO2 93% Filed Vitals:   07/02/13 1708 07/02/13 1822 07/02/13 1830 07/02/13 1900  BP: 122/54  139/62 131/76  Pulse: 62  84 93  Temp: 98 F (36.7 C)     TempSrc: Oral     Resp: 18  27 16   SpO2: 77% 97% 97% 93%     Physical Exam 1730: Physical examination:  Nursing notes reviewed; Vital signs and O2 SAT reviewed;  Constitutional: Well developed, Well nourished, In no acute distress; Head:  Normocephalic, atraumatic; Eyes: EOMI, PERRL, No scleral icterus; ENMT: Mouth and pharynx normal, Mucous membranes dry; Neck: Supple, Full range of motion, No lymphadenopathy; Cardiovascular: Regular rate and rhythm, No gallop; Respiratory: Breath sounds diminished & equal bilaterally, faint scattered wheezes. No audible wheezing. Speaking full sentences, No retrax or access mm use. Normal respiratory effort/excursion; Chest: Nontender, Movement normal; Abdomen: Soft, Nontender, Nondistended, Normal bowel sounds; Genitourinary: No CVA tenderness; Extremities: Pulses normal, No tenderness, No edema, No calf edema or asymmetry.; Neuro: AA&Ox3, Major CN grossly intact. Major CN grossly intact.Speech clear.  No facial droop.  No nystagmus. Grips equal. Strength 5/5 equal bilat UE's and LE's.  DTR 2/4 equal bilat UE's and LE's.  No gross sensory deficits.  Normal cerebellar testing bilat UE's (finger-nose) and LE's (heel-shin)..; Skin: Color normal, Warm, Dry.    ED Course  Procedures     EKG Interpretation None      MDM  MDM Reviewed: previous chart, nursing note and vitals Reviewed previous: labs and ECG Interpretation: labs, ECG and x-ray Total time providing critical care: 30-74 minutes. This excludes time spent performing separately reportable procedures and services. Consults: admitting MD   CRITICAL CARE Performed by: Laray Anger Total critical care time: 40 Critical care time was exclusive of separately billable procedures and treating other patients. Critical care was necessary to treat or prevent imminent or life-threatening deterioration. Critical care was time spent personally by me on the following activities: development of treatment plan with patient and/or surrogate as well as nursing, discussions with consultants, evaluation of patient's response to treatment, examination of patient, obtaining history from patient or surrogate, ordering and performing treatments and interventions, ordering and review of laboratory studies, ordering and review of radiographic studies, pulse oximetry and re-evaluation of patient's condition.    Date: 07/02/2013  Rate: 76  Rhythm: normal sinus rhythm  QRS Axis: normal  Intervals: normal  ST/T Wave abnormalities: normal  Conduction Disutrbances:nonspecific intraventricular conduction delay  Narrative Interpretation:   Old EKG Reviewed: unchanged; no significant changes from previous EKG dated 05/29/2012.   Results for orders placed during the hospital encounter of 07/02/13  TROPONIN I  Result Value Ref Range   Troponin I <0.30  <0.30 ng/mL  PRO B NATRIURETIC PEPTIDE      Result Value Ref Range   Pro B Natriuretic peptide (BNP) 726.2 (*) 0 - 125 pg/mL  CBC WITH DIFFERENTIAL      Result Value Ref Range   WBC 8.7  4.0 - 10.5 K/uL   RBC 4.34  4.22 - 5.81 MIL/uL   Hemoglobin 13.1  13.0 - 17.0 g/dL   HCT 40.943.1  81.139.0 - 91.452.0 %   MCV 99.3  78.0 - 100.0 fL   MCH 30.2  26.0 - 34.0 pg   MCHC 30.4  30.0 - 36.0 g/dL   RDW 78.212.9  95.611.5 - 21.315.5 %   Platelets 334  150 - 400 K/uL   Neutrophils Relative % 81 (*) 43 - 77 %   Neutro Abs 7.1  1.7 - 7.7 K/uL   Lymphocytes Relative 7 (*) 12 - 46 %   Lymphs Abs 0.6 (*) 0.7 - 4.0 K/uL   Monocytes Relative 10  3 - 12 %   Monocytes Absolute 0.8  0.1 - 1.0 K/uL   Eosinophils Relative 2  0 - 5 %   Eosinophils Absolute 0.1  0.0 -  0.7 K/uL   Basophils Relative 0  0 - 1 %   Basophils Absolute 0.0  0.0 - 0.1 K/uL  BASIC METABOLIC PANEL      Result Value Ref Range   Sodium 140  137 - 147 mEq/L   Potassium 4.1  3.7 - 5.3 mEq/L   Chloride 94 (*) 96 - 112 mEq/L   CO2 40 (*) 19 - 32 mEq/L   Glucose, Bld 101 (*) 70 - 99 mg/dL   BUN 32 (*) 6 - 23 mg/dL   Creatinine, Ser 0.860.78  0.50 - 1.35 mg/dL   Calcium 8.9  8.4 - 57.810.5 mg/dL   GFR calc non Af Amer >90  >90 mL/min   GFR calc Af Amer >90  >90 mL/min  LACTIC ACID, PLASMA      Result Value Ref Range   Lactic Acid, Venous 0.8  0.5 - 2.2 mmol/L   Dg Chest Port 1 View 07/02/2013   CLINICAL DATA:  Shortness of breath. Hypoxia. Acute respiratory failure.  EXAM: PORTABLE CHEST - 1 VIEW  COMPARISON:  10/01/2011.  FINDINGS: The heart remains normal in size. The lungs remain hyperexpanded. Diffuse increase in prominence of the pulmonary vasculature and interstitial markings. Interval patchy airspace opacity in the left lower lobe. Diffuse osteopenia.  IMPRESSION: 1. Patchy airspace opacity in the left lower lobe with an appearance most compatible with pneumonia. 2. Mild changes of congestive heart failure superimposed on COPD.   Electronically Signed   By: Gordan PaymentSteve  Reid M.D.   On: 07/02/2013 17:59    2000:  BNP mildly elevated (no old to compare), mild CHF on CXR; will dose IV lasix. IV abx given for CAP. Hour long neb and IV solumedrol given on arrival for O2 Sat 77% R/A and diffuse wheezing. Sats now 93-97% on O2 N/C. Dx and testing d/w pt and family.  Questions answered.  Verb understanding, agreeable to admit.   2040:  T/C to [facility] Dr. Gonzella Lexhungel, case discussed, including:  HPI, pertinent PM/SHx, VS/PE, dx testing, ED course and treatment:  Agreeable to admit, requests to write temporary orders, obtain medical bed to team 1.     Laray AngerKathleen M Malisha Mabey, DO 07/05/13 1250

## 2013-07-02 NOTE — ED Notes (Signed)
CRITICAL VALUE ALERT  Critical value received:  CO2 - 68.5   Date of notification:  07/02/2013  Time of notification:  2150  Critical value read back: yes  Nurse who received alert:  LJS  MD notified (1st page):  Dr Gonzella Lexhungel  Time of first page:  2151  MD notified (2nd page):  Time of second page:  Responding MD:  Dr Gonzella Lexhungel  Time MD responded:  2151

## 2013-07-02 NOTE — ED Notes (Signed)
Gave patient sprite and peanut butter crackers as requested and approved by Dr Gonzella Lexhungel.

## 2013-07-02 NOTE — ED Notes (Signed)
Low 02 sat at home 58 in am and then in 70's. Family says dropping things,,  And d/c LOC  Feels "off balance"

## 2013-07-02 NOTE — H&P (Signed)
[facility] History and Physical  Liana GeroldMack A Schofield ZOX:[id number] DOB: [birth year] DOA: 07/02/2013  Referring physician: Dr. Clarene DukeMcManus PCP: Kirk RuthsMCGOUGH,WILLIAM M, MD   Chief Complaint:  Progressive SOB X 2 weeks. Altered mental status x one day  HPI:  68 year old male with a history of hypertension, COPD with heavy tobacco use, history of pneumonia, GERD who was brought to the ED by his wife as patient was found to be hypoxic at home. Patient's wife found patient to be progressively short of breath on exertion for past 2 weeks. Patient was also  dropping things and had unsteady gait for past one week. Patient has had falls at home but denies any head injury. Since this morning he has been confused as well. Patient's wife is on home oxygen and she used her pulse oximeter on the patient and found his oxygen saturation in the 50s on room air. Patient also has progressive cough with whitish productive sputum for past 2 weeks. Patient reports that he has had persistent shortness of breath for past 2 years with dyspnea on exertion with unaccustomed activities.  Wife reports poor appetite and weight loss but is unable to specify the duration and severity of it. Patient denies headache, dizziness, fever, chills, nausea , vomiting, chest pain, palpitations,  abdominal pain, bowel or urinary symptoms. Patient denies any sick contacts or recent travel.  Course in the ED Patient's O2 sat was in the 70s on room air and improved to 98% on 2 L via nasal cannula. He was tachypnic upto  30. CBC was normal. Chemistry showed CO2 of 40 and BUN of 32. Troponin was negative.pro BNP was in 700s.  Chest x-ray showed patchy airspace opacity in the left lower lobe suggestive of pneumonia. Also showed mild changes of CHF with superimposed COPD. Patient appeared to be wheezy on exam and was given one round of albuterol and Atrovent neb, IV Solu-Medrol 125 mg and IV Levaquin. [facility] consulted for admission to  medical floor.  Review of Systems:  Constitutional: Denies fever, chills, diaphoresis, appetite change and fatigue.  HEENT: Denies photophobia, eye pain,  hearing loss, ear pain, congestion, sore throat, rhinorrhea, sneezing, mSOB, DOE, cough, wheezing denies chest tightness Cardiovascular: Denies chest pain, palpitations and leg swelling.  Gastrointestinal: Denies nausea, vomiting, abdominal pain, diarrhea, constipation, blood in stool and abdominal distention.  Genitourinary: Denies dysuria, urgency, frequency, hematuria, flank pain and difficulty urinating.  Endocrine: Denies: hot or cold intolerance,  polyuria, polydipsia. Musculoskeletal: Denies myalgias, back pain, joint swelling, arthralgias and gait problem.  Skin: Denies pallor, rash and wound.  Neurological: Denies dizziness, seizures, syncope, weakness, light-headedness, numbness and headaches.  Psychiatric/Behavioral:  confusion, denies nervousness, sleep disturbance and agitation   Past Medical History  Diagnosis Date  . COPD (chronic obstructive pulmonary disease)   . Hypertension   . Tobacco abuse   . Anxiety   . Anemia 04/24/2011  . Pneumonia     hospitalized 04/2011  . Shortness of breath     with exertion  . GERD (gastroesophageal reflux disease)   . Constipation   . Eczema   . Kidney stones    Past Surgical History  Procedure Laterality Date  . Hemorrhoid surgery    . Colonoscopy    . Inguinal hernia repair Left 06/06/2012    Procedure: HERNIA REPAIR INGUINAL ADULT;  Surgeon: Shelly Rubensteinouglas A Blackman, MD;  Location: [facility] OR;  Service: General;  Laterality: Left;  . Insertion of mesh Left 06/06/2012    Procedure: INSERTION OF MESH;  Surgeon:  Shelly Rubenstein, MD;  Location: [facility] OR;  Service: General;  Laterality: Left;  . Hernia repair Left 06/06/2012   Social History:  reports that he has been smoking.  He does not have any smokeless tobacco history on file. He reports that he does not drink alcohol or use illicit  drugs.  Allergies  Allergen Reactions  . Oxycodone Nausea And Vomiting    Family History  Problem Relation Age of Onset  . Stroke Mother   . Deep vein thrombosis Father   . Lung cancer Sister   . Stroke Sister   . Hypertension Sister   . Heart disease Brother   . Stroke Brother   . Stroke Sister   . Heart disease Sister     Prior to Admission medications   Medication Sig Start Date End Date Taking? Authorizing Provider  albuterol (PROVENTIL HFA;VENTOLIN HFA) 108 (90 BASE) MCG/ACT inhaler Inhale 2 puffs into the lungs every 6 (six) hours as needed. For shortness of breath   Yes Historical Provider, MD  amLODipine (NORVASC) 5 MG tablet Take 5 mg by mouth every morning.   Yes Historical Provider, MD  lisinopril (PRINIVIL,ZESTRIL) 20 MG tablet Take 20 mg by mouth every morning.    Yes Historical Provider, MD  tiotropium (SPIRIVA) 18 MCG inhalation capsule Place 18 mcg into inhaler and inhale daily.   Yes Historical Provider, MD  traZODone (DESYREL) 50 MG tablet Take 50 mg by mouth at bedtime.   Yes Historical Provider, MD     Physical Exam:  Filed Vitals:   07/02/13 1930 07/02/13 2026 07/02/13 2030 07/02/13 2100  BP: 146/63  133/68 97/80  Pulse: 90  104 94  Temp:      TempSrc:      Resp: 16  28 30   SpO2: 93% 93% 91% 98%    Constitutional: Vital signs reviewed.  Elderly cachectic male in no acute distress HEENT: no pallor, no icterus, moist oral mucosa, no cervical lymphadenopathy, temporal wasting Cardiovascular: RRR, S1 normal, S2 normal, no MRG Chest: Coarse crackles over left lung base, scattered rhonchi Abdominal: Soft. Non-tender, non-distended, bowel sounds are normal, no masses, organomegaly, or guarding present.  Ext: warm, no edema Neurological: A&O x2, cranial nerves intact, normal motor power and tone, normal sensations, mild flapping tremors  Labs on Admission:  Basic Metabolic Panel:  Recent Labs Lab 07/02/13 1736  NA 140  K 4.1  CL 94*  CO2 40*   GLUCOSE 101*  BUN 32*  CREATININE 0.78  CALCIUM 8.9   Liver Function Tests: No results found for this basename: AST, ALT, ALKPHOS, BILITOT, PROT, ALBUMIN,  in the last 168 hours No results found for this basename: LIPASE, AMYLASE,  in the last 168 hours No results found for this basename: AMMONIA,  in the last 168 hours CBC:  Recent Labs Lab 07/02/13 1736  WBC 8.7  NEUTROABS 7.1  HGB 13.1  HCT 43.1  MCV 99.3  PLT 334   Cardiac Enzymes:  Recent Labs Lab 07/02/13 1736  TROPONINI <0.30   BNP: No components found with this basename: POCBNP,  CBG: No results found for this basename: GLUCAP,  in the last 168 hours  Radiological Exams on Admission: Dg Chest Port 1 View  07/02/2013   CLINICAL DATA:  Shortness of breath. Hypoxia. Acute respiratory failure.  EXAM: PORTABLE CHEST - 1 VIEW  COMPARISON:  10/01/2011.  FINDINGS: The heart remains normal in size. The lungs remain hyperexpanded. Diffuse increase in prominence of the pulmonary vasculature and interstitial  markings. Interval patchy airspace opacity in the left lower lobe. Diffuse osteopenia.  IMPRESSION: 1. Patchy airspace opacity in the left lower lobe with an appearance most compatible with pneumonia. 2. Mild changes of congestive heart failure superimposed on COPD.   Electronically Signed   By: Gordan PaymentSteve  Reid M.D.   On: 07/02/2013 17:59    EKG: Normal sinus rhythm, no ST-T changes  Assessment/Plan Principal Problem:   Community acquired pneumonia Admit to MedSurg The patient received a dose of IV Levaquin in the ED which I would continue. Check blood culture, urine for strep antigen and Legionella, sputum culture. Supportive care with Tylenol, gentle IV fluids and antitussives.  Active Problems:   Acute respiratory failure with hypoxia and hypercarbia Secondary to COPD exacerbation and underlying pneumonia. Patient has metabolic encephalopathy likely secondary to this. Continue O2 via nasal cannula. Will place on  scheduled DuraNeb and when necessary albuterol nebs. Patient received IV Solu Medrol in the ED.  place him on oral prednisone daily. Check ABG. Counseled on smoking cessation. Resume home inhaler speedier technique to assess for home O2 needs and we need steroid inhaler upon discharge as well. -patient has mildly elevated pro BNP with comment of possible CHF findings on CXR. clinically he does not have any signs of CHF.  Metabolic encephalopathy Likely secondary to pneumonia and hypercarbic respiratory failure. Check head CT, TSH, B12, RPR and HIV. Check UA.    Protein calorie malnutrition Will obtain nutrition consult.  Generalized weakness Check head CT. PT evaluation in the morning  Tobacco abuse Counseled on smoking cessation Next nicotine patch.  Hypertension Blood pressure will normal with systolic BP in the high 90s my evaluation.  will home blood pressure medication at this time.   Diet: Regular  DVT prophylaxis: sq lovenox   Code Status: full code Family Communication: discussed with wife at bedside Disposition Plan: home once improved  Delmy Holdren [facility] Pager [phone]  Total time spent on admission :50 minutes  If 7PM-7AM, please contact night-coverage [email or web address] Password [email or web address] 07/02/2013, 9:25 PM

## 2013-07-02 NOTE — ED Notes (Signed)
CRITICAL VALUE ALERT  Critical value received:  CO2 - 40  Date of notification:  07/02/2013  Time of notification:  1936  Critical value read back: yes  Nurse who received alert:  LJS  MD notified (1st page):  Dr Richrd PrimeMcMannus  Time of first page:  1937  MD notified (2nd page):  Time of second page:  Responding MD:  Dr Richrd PrimeMcMannus  Time MD responded:  [id number]

## 2013-07-03 LAB — BLOOD GAS, ARTERIAL
Acid-Base Excess: 10.1 mmol/L — ABNORMAL HIGH (ref 0.0–2.0)
Bicarbonate: 37.6 mEq/L — ABNORMAL HIGH (ref 20.0–24.0)
Drawn by: 21310
O2 Content: 1.5 L/min
O2 Saturation: 94.6 %
PH ART: 7.233 — AB (ref 7.350–7.450)
PO2 ART: 83.2 mmHg (ref 80.0–100.0)
Patient temperature: 37
TCO2: 33.3 mmol/L (ref 0–100)
pCO2 arterial: 92.3 mmHg (ref 35.0–45.0)

## 2013-07-03 LAB — HIV ANTIBODY (ROUTINE TESTING W REFLEX): HIV 1&2 Ab, 4th Generation: NONREACTIVE

## 2013-07-03 LAB — URINALYSIS, ROUTINE W REFLEX MICROSCOPIC
BILIRUBIN URINE: NEGATIVE
GLUCOSE, UA: 100 mg/dL — AB
HGB URINE DIPSTICK: NEGATIVE
KETONES UR: NEGATIVE mg/dL
Leukocytes, UA: NEGATIVE
Nitrite: NEGATIVE
PROTEIN: NEGATIVE mg/dL
Specific Gravity, Urine: >1.03 — ABNORMAL HIGH (ref 1.005–1.030)
Urobilinogen, UA: 0.2 mg/dL (ref 0.0–1.0)
pH: 5.5 (ref 5.0–8.0)

## 2013-07-03 LAB — STREP PNEUMONIAE URINARY ANTIGEN: STREP PNEUMO URINARY ANTIGEN: NEGATIVE

## 2013-07-03 LAB — VITAMIN B12: Vitamin B-12: 407 pg/mL (ref 211–911)

## 2013-07-03 LAB — RPR

## 2013-07-03 LAB — MRSA PCR SCREENING: MRSA by PCR: NEGATIVE

## 2013-07-03 LAB — TSH: TSH: 0.515 u[IU]/mL (ref 0.350–4.500)

## 2013-07-03 MED ORDER — METHYLPREDNISOLONE SODIUM SUCC 125 MG IJ SOLR
60.0000 mg | Freq: Four times a day (QID) | INTRAMUSCULAR | Status: DC
  Administered 2013-07-03 – 2013-07-04 (×5): 60 mg via INTRAVENOUS
  Filled 2013-07-03 (×5): qty 2

## 2013-07-03 MED ORDER — TRAZODONE HCL 50 MG PO TABS
50.0000 mg | ORAL_TABLET | Freq: Every day | ORAL | Status: DC
  Administered 2013-07-03 – 2013-07-04 (×2): 50 mg via ORAL
  Filled 2013-07-03 (×2): qty 1

## 2013-07-03 MED ORDER — HYDRALAZINE HCL 20 MG/ML IJ SOLN
10.0000 mg | Freq: Once | INTRAMUSCULAR | Status: AC
  Administered 2013-07-03: 10 mg via INTRAVENOUS
  Filled 2013-07-03: qty 1

## 2013-07-03 NOTE — Progress Notes (Signed)
Upon waking patient up for vitals and lab to draw ABG he was confused and forgot where he was. Lab then called and gave me panic CO2 92.3 i text paged the hospitalist and respiratory if now in the room with patient placing him on a breathing machine to blow off some of the CO2 he has built up. She drew his ABG while wearing 1.5 L nasal canula. Waiting on MD to call back. Bennett Scrape RN

## 2013-07-03 NOTE — Progress Notes (Signed)
UR chart review completed.  

## 2013-07-03 NOTE — Progress Notes (Addendum)
[facility] Progress Note   Shane Todd UJW:[id number] DOB: March 03, 1945 DOA: 07/02/2013 PCP: Kirk RuthsMCGOUGH,WILLIAM M, MD  Brief narrative: Shane GeroldMack A Direnzo is a 68 y.o. male presenting on 07/02/2013 with a history of hypertension, COPD with heavy tobacco use, history of pneumonia, GERD who was brought to the ED by his wife as patient was found to be hypoxic at home. He is admitted for CAP and a COPD exacerbation.     Subjective: Had to be placed on BiPAP overnight and now feeling much better. Off of Bipap with a pulse ox of 98% on room air.   Assessment/Plan: Principal Problem   Acute respiratory failure with hypoxia and hypercarbia   (a) Community acquired pneumonia- agree with Levaquin    (b) COPD with exacerbation- off BiPAP now and appears to be doing well. Cont Solumedrol at current dose for now  Active Problems:  Dehydration - cont NS at 75 cc/hr    Tobacco abuse - have asvised him to quit    Benign hypertension - stable    Acute encephalopathy - possibly due to acute infection and hypercarbia and hypoxi     Code Status: Full code Family Communication: with wife Disposition Plan: home  Consultants: Pulm  Procedures: none  Antibiotics: Antibiotics Given (last 72 hours)   None       DVT prophylaxis: Lovenox  Objective: Filed Weights   07/02/13 2225 07/03/13 0001 07/03/13 0625  Weight: 68.04 kg (150 lb) 68.04 kg (150 lb) 65.8 kg (145 lb 1 oz)   Filed Vitals:   07/03/13 0800 07/03/13 0801 07/03/13 0900 07/03/13 1000  BP: 138/76  143/79 137/74  Pulse: 85  100 88  Temp:      TempSrc:      Resp: 19  17 15   Height:      Weight:      SpO2: 93% 92% 92% 90%      Intake/Output Summary (Last 24 hours) at 07/03/13 1123 Last data filed at 07/03/13 1000  Gross per 24 hour  Intake 1231.25 ml  Output    400 ml  Net 831.25 ml     Exam: General: No acute respiratory distress Lungs: mild wheeze and rhonchi Cardiovascular: Regular rate and  rhythm without murmur gallop or rub normal S1 and S2 Abdomen: Nontender, nondistended, soft, bowel sounds positive, no rebound, no ascites, no appreciable mass Extremities: No significant cyanosis, clubbing, or edema bilateral lower extremities  Data Reviewed: Basic Metabolic Panel:  Recent Labs Lab 07/02/13 1736  NA 140  K 4.1  CL 94*  CO2 40*  GLUCOSE 101*  BUN 32*  CREATININE 0.78  CALCIUM 8.9   Liver Function Tests: No results found for this basename: AST, ALT, ALKPHOS, BILITOT, PROT, ALBUMIN,  in the last 168 hours No results found for this basename: LIPASE, AMYLASE,  in the last 168 hours No results found for this basename: AMMONIA,  in the last 168 hours CBC:  Recent Labs Lab 07/02/13 1736  WBC 8.7  NEUTROABS 7.1  HGB 13.1  HCT 43.1  MCV 99.3  PLT 334   Cardiac Enzymes:  Recent Labs Lab 07/02/13 1736  TROPONINI <0.30   BNP (last 3 results)  Recent Labs  07/02/13 1736  PROBNP 726.2*   CBG: No results found for this basename: GLUCAP,  in the last 168 hours  Recent Results (from the past 240 hour(s))  CULTURE, BLOOD (ROUTINE X 2)     Status: None   Collection Time    07/02/13 11:37 PM  Result Value Ref Range Status   Specimen Description BLOOD LEFT ANTECUBITAL   Final   Special Requests     Final   Value: BOTTLES DRAWN AEROBIC AND ANAEROBIC AEB 8CC ANA 6CC   Culture PENDING   Incomplete   Report Status PENDING   Incomplete  CULTURE, BLOOD (ROUTINE X 2)     Status: None   Collection Time    07/02/13 11:37 PM      Result Value Ref Range Status   Specimen Description BLOOD LEFT FOREARM   Final   Special Requests     Final   Value: BOTTLES DRAWN AEROBIC AND ANAEROBIC AEB 8CC ANA 6CC   Culture PENDING   Incomplete   Report Status PENDING   Incomplete  MRSA PCR SCREENING     Status: None   Collection Time    07/03/13  6:26 AM      Result Value Ref Range Status   MRSA by PCR NEGATIVE  NEGATIVE Final   Comment:            The GeneXpert MRSA  Assay (FDA     approved for NASAL specimens     only), is one component of a     comprehensive MRSA colonization     surveillance program. It is not     intended to diagnose MRSA     infection nor to guide or     monitor treatment for     MRSA infections.     Studies:  Recent x-ray studies have been reviewed in detail by the Attending Physician  Scheduled Meds:  Scheduled Meds: . enoxaparin (LOVENOX) injection  40 mg Subcutaneous Q24H  . ipratropium-albuterol  3 mL Nebulization Q4H  . levofloxacin (LEVAQUIN) IV  750 mg Intravenous Q24H  . methylPREDNISolone (SOLU-MEDROL) injection  60 mg Intravenous Q6H  . multivitamin with minerals  1 tablet Oral Daily  . nicotine  21 mg Transdermal Daily   Continuous Infusions: . sodium chloride 75 mL/hr at 07/03/13 1000    Time spent on care of this patient: 35 min   Calvert Cantor, MD 07/03/2013, 11:23 AM  LOS: 1 day   [facility] Office  [phone] Pager - Text Page per Loretha Stapler   If 7PM-7AM, please contact night-coverage [email or web address]

## 2013-07-03 NOTE — Progress Notes (Signed)
Pt is a retainer and RT placed Pt on RA. He is responsive and isnt confused. He thinks that the sleeping pill that the nurse gave him last night was the cause of his confusion.

## 2013-07-03 NOTE — Progress Notes (Deleted)
[facility] has come and gone. Patient bathed and placed in bag with tags. Bed controlled called and informed to call funeral home to come pick her up. Bennett Scrape RN

## 2013-07-03 NOTE — Progress Notes (Signed)
INITIAL NUTRITION ASSESSMENT  DOCUMENTATION CODES Per approved criteria  -Severe malnutrition in the context of chronic illness   INTERVENTION: Ensure Complete po BID, each supplement provides 350 kcal and 13 grams of protein   NUTRITION DIAGNOSIS: Inadequate oral intake related to community acquired pneumonia and COPD with exacerbation as evidenced by increased protein energy requirements and severe muscle and fat wasting.   Goal: Pt to meet >/= 90% of their estimated nutrition needs    Monitor:  Po intake, labs and wt trends   Reason for Assessment: Malnutrition Screen Score =  3, consult assess nutrition status  68 y.o. male  Admitting Dx: Community acquired pneumonia  ASSESSMENT: Pt very pleasant 68 yo male who has hx includes pneumonia,  COPD, Shortness of breath and tobacco abuse. His wife is present. His po intake has been decreased for past 2 weeks with increased difficulty breathing. We discussed ways to increase po intake such as liquid nutrition to prevent further weight loss. Also reviewed protein foods and emphasized the importance of including protein foods at each meal. Encouraged small frequent meals throughout the day. Mr Chowning is agreeable to drink Ensure while he is here and I recommend he continue same after discharge to help replete his nutrition status. He was assessed by RD during hospitalization in 04/2011  (PNA). His wt then at 163#. His current wt reflects decrease of 18#, 11%.   Nutrition Focused Physical Exam:  Subcutaneous Fat:  Orbital Region: moderate-severe wasting Upper Arm Region: moderate wasting Thoracic and Lumbar Region: not assessed  Muscle:  Temple Region: moderate wasting Clavicle Bone Region: moderate wasting Clavicle and Acromion Bone Region: moderate wasting Scapular Bone Region: not assessed Dorsal Hand: WDL Patellar Region: moderate-severe wasting Anterior Thigh Region: severe wasting Posterior Calf Region: severe  wasting  Edema: none  Pt meets criteria for severe MALNUTRITION in the context of chronic illness as evidenced by Severe fat and muscle wasting and energy intake </= 75% for >/= 1 month.   Height: Ht Readings from Last 1 Encounters:  07/02/13 6\' 1"  (1.854 m)    Weight: Wt Readings from Last 1 Encounters:  07/03/13 145 lb 1 oz (65.8 kg)    Ideal Body Weight: 184# (83.6 kg)  % Ideal Body Weight: 79%  Wt Readings from Last 10 Encounters:  07/03/13 145 lb 1 oz (65.8 kg)  06/24/12 156 lb (70.761 kg)  05/29/12 153 lb 14.1 oz (69.8 kg)  05/12/12 153 lb (69.4 kg)  10/02/11 153 lb (69.4 kg)  04/26/11 163 lb 2.3 oz (74 kg)    Usual Body Weight: 163#  % Usual Body Weight: 89%  BMI:  Body mass index is 19.14 kg/(m^2). low end of normal range  Estimated Nutritional Needs: Kcal: 2000-2310  Protein: 99 gr Fluid: 2.0-2.3 liters daily  Skin: intact  Diet Order: General  EDUCATION NEEDS: -Education needs addressed   Intake/Output Summary (Last 24 hours) at 07/03/13 1527 Last data filed at 07/03/13 1200  Gross per 24 hour  Intake 1621.25 ml  Output    800 ml  Net 821.25 ml    Last BM: 06/29/13  Labs:   Recent Labs Lab 07/02/13 1736  NA 140  K 4.1  CL 94*  CO2 40*  BUN 32*  CREATININE 0.78  CALCIUM 8.9  GLUCOSE 101*    CBG (last 3)  No results found for this basename: GLUCAP,  in the last 72 hours  Scheduled Meds: . enoxaparin (LOVENOX) injection  40 mg Subcutaneous Q24H  . ipratropium-albuterol  3 mL Nebulization Q4H  . levofloxacin (LEVAQUIN) IV  750 mg Intravenous Q24H  . methylPREDNISolone (SOLU-MEDROL) injection  60 mg Intravenous Q6H  . multivitamin with minerals  1 tablet Oral Daily  . nicotine  21 mg Transdermal Daily    Continuous Infusions: . sodium chloride 75 mL/hr at 07/03/13 1200    Past Medical History  Diagnosis Date  . COPD (chronic obstructive pulmonary disease)   . Hypertension   . Tobacco abuse   . Anxiety   . Anemia  04/24/2011  . Pneumonia     hospitalized 04/2011  . Shortness of breath     with exertion  . GERD (gastroesophageal reflux disease)   . Constipation   . Eczema   . Kidney stones     Past Surgical History  Procedure Laterality Date  . Hemorrhoid surgery    . Colonoscopy    . Inguinal hernia repair Left 06/06/2012    Procedure: HERNIA REPAIR INGUINAL ADULT;  Surgeon: Shelly Rubensteinouglas A Blackman, MD;  Location: [facility] OR;  Service: General;  Laterality: Left;  . Insertion of mesh Left 06/06/2012    Procedure: INSERTION OF MESH;  Surgeon: Shelly Rubensteinouglas A Blackman, MD;  [facility];  Service: General;  Laterality: Left;  . Hernia repair Left 06/06/2012    Royann ShiversLynn Kasheem Toner MS,RD,CSG,LDN Office: [phone] Pager: [phone]

## 2013-07-03 NOTE — Consult Note (Signed)
Consult requested by: [facility] Consult requested for respiratory failure:  HPI: This is a [age] year old who was admitted to the hospital last night with pneumonia and respiratory failure. He has a significant history of COPD. He has previously been hospitalized with pneumonia. He has been having increasing problems for the last 2 weeks or so and was found to be markedly hypoxic at home. He was admitted to the hospital started on oxygen antibiotics etc. but became very somnolent and the night was found to have a PCO2, PO2 the 90s and he was transferred to step down unit and placed on BiPAP. He is now much more alert. He has been coughing some.  Past Medical History  Diagnosis Date  . COPD (chronic obstructive pulmonary disease)   . Hypertension   . Tobacco abuse   . Anxiety   . Anemia 04/24/2011  . Pneumonia     hospitalized 04/2011  . Shortness of breath     with exertion  . GERD (gastroesophageal reflux disease)   . Constipation   . Eczema   . Kidney stones      Family History  Problem Relation Age of Onset  . Stroke Mother   . Deep vein thrombosis Father   . Lung cancer Sister   . Stroke Sister   . Hypertension Sister   . Heart disease Brother   . Stroke Brother   . Stroke Sister   . Heart disease Sister      History   Social History  . Marital Status: Married    Spouse Name: N/A    Number of Children: N/A  . Years of Education: N/A   Social History Main Topics  . Smoking status: Current Every Day Smoker -- 0.50 packs/day    Last Attempt to Quit: 05/22/2011  . Smokeless tobacco: None  . Alcohol Use: No  . Drug Use: No  . Sexual Activity: Yes   Other Topics Concern  . None   Social History Narrative  . None     ROS: Somewhat incomplete but he has not been having chest pain PND orthopnea hemoptysis.    Objective: Vital signs in last 24 hours: Temp:  [98 F (36.7 C)-98.3 F (36.8 C)] 98.3 F (36.8 C) (05/22 0625) Pulse Rate:  [62-110] 85  (05/22 0635) Resp:  [12-30] 17 (05/22 0635) BP: (97-146)/(54-96) 123/70 mmHg (05/22 0635) SpO2:  [77 %-98 %] 94 % (05/22 0635) FiO2 (%):  [28 %] 28 % (05/21 2319) Weight:  [65.8 kg (145 lb 1 oz)-68.04 kg (150 lb)] 65.8 kg (145 lb 1 oz) (05/22 0625) Weight change:  Last BM Date: 06/29/13  Intake/Output from previous day: 05/21 0701 - 05/22 0700 In: -  Out: 200 [Urine:200]  PHYSICAL EXAM He is now awake and alert and on BiPAP. His neck is supple. His pupils are reactive. Mucous membranes are moist. His chest shows diminished breath sounds and prolonged expiration and some rhonchi on the left. His heart is regular without gallop. His abdomen is soft without masses. Extremities showed no edema. Central nervous system exam is grossly intact  Lab Results: Basic Metabolic Panel:  Recent Labs  24/40/1005/21/15 1736  NA 140  K 4.1  CL 94*  CO2 40*  GLUCOSE 101*  BUN 32*  CREATININE 0.78  CALCIUM 8.9   Liver Function Tests: No results found for this basename: AST, ALT, ALKPHOS, BILITOT, PROT, ALBUMIN,  in the last 72 hours No results found for this basename: LIPASE, AMYLASE,  in the last 72  hours No results found for this basename: AMMONIA,  in the last 72 hours CBC:  Recent Labs  07/02/13 1736  WBC 8.7  NEUTROABS 7.1  HGB 13.1  HCT 43.1  MCV 99.3  PLT 334   Cardiac Enzymes:  Recent Labs  07/02/13 1736  TROPONINI <0.30   BNP:  Recent Labs  07/02/13 1736  PROBNP 726.2*   D-Dimer: No results found for this basename: DDIMER,  in the last 72 hours CBG: No results found for this basename: GLUCAP,  in the last 72 hours Hemoglobin A1C: No results found for this basename: HGBA1C,  in the last 72 hours Fasting Lipid Panel: No results found for this basename: CHOL, HDL, LDLCALC, TRIG, CHOLHDL, LDLDIRECT,  in the last 72 hours Thyroid Function Tests: No results found for this basename: TSH, T4TOTAL, FREET4, T3FREE, THYROIDAB,  in the last 72 hours Anemia Panel: No results  found for this basename: VITAMINB12, FOLATE, FERRITIN, TIBC, IRON, RETICCTPCT,  in the last 72 hours Coagulation: No results found for this basename: LABPROT, INR,  in the last 72 hours Urine Drug Screen: Drugs of Abuse     Component Value Date/Time   LABOPIA NONE DETECTED 04/23/2011 1259   COCAINSCRNUR NONE DETECTED 04/23/2011 1259   LABBENZ NONE DETECTED 04/23/2011 1259   AMPHETMU NONE DETECTED 04/23/2011 1259   THCU NONE DETECTED 04/23/2011 1259   LABBARB NONE DETECTED 04/23/2011 1259    Alcohol Level: No results found for this basename: ETH,  in the last 72 hours Urinalysis:  Recent Labs  07/03/13 0425  COLORURINE YELLOW  LABSPEC >1.030*  PHURINE 5.5  GLUCOSEU 100*  HGBUR NEGATIVE  BILIRUBINUR NEGATIVE  KETONESUR NEGATIVE  PROTEINUR NEGATIVE  UROBILINOGEN 0.2  NITRITE NEGATIVE  LEUKOCYTESUR NEGATIVE   Misc. Labs:   ABGS:  Recent Labs  07/03/13 0450  PHART 7.233*  PO2ART 83.2  TCO2 33.3  HCO3 37.6*     MICROBIOLOGY: Recent Results (from the past 240 hour(s))  CULTURE, BLOOD (ROUTINE X 2)     Status: None   Collection Time    07/02/13 11:37 PM      Result Value Ref Range Status   Specimen Description BLOOD LEFT ANTECUBITAL   Final   Special Requests     Final   Value: BOTTLES DRAWN AEROBIC AND ANAEROBIC AEB 8CC ANA 6CC   Culture PENDING   Incomplete   Report Status PENDING   Incomplete  CULTURE, BLOOD (ROUTINE X 2)     Status: None   Collection Time    07/02/13 11:37 PM      Result Value Ref Range Status   Specimen Description BLOOD LEFT FOREARM   Final   Special Requests     Final   Value: BOTTLES DRAWN AEROBIC AND ANAEROBIC AEB 8CC ANA 6CC   Culture PENDING   Incomplete   Report Status PENDING   Incomplete    Studies/Results: Ct Head Wo Contrast  07/02/2013   CLINICAL DATA:  Altered mental status.  EXAM: CT HEAD WITHOUT CONTRAST  TECHNIQUE: Contiguous axial images were obtained from the base of the skull through the vertex without intravenous  contrast.  COMPARISON:  Head CT 04/23/2011.  FINDINGS: Patchy and confluent areas of decreased attenuation are noted throughout the deep and periventricular white matter of the cerebral hemispheres bilaterally, compatible with chronic microvascular ischemic disease. No acute intracranial abnormalities. Specifically, no evidence of acute intracranial hemorrhage, no definite findings of acute/subacute cerebral ischemia, no mass, mass effect, hydrocephalus or abnormal intra or extra-axial fluid  collections. Visualized paranasal sinuses and mastoids are well pneumatized. No acute displaced skull fractures are identified.  IMPRESSION: 1. No acute intracranial abnormalities. 2. Chronic microvascular ischemic changes in the cerebral white matter, similar to the prior examination, as above.   Electronically Signed   By: Trudie Reed M.D.   On: 07/02/2013 22:37   Dg Chest Port 1 View  07/02/2013   CLINICAL DATA:  Shortness of breath. Hypoxia. Acute respiratory failure.  EXAM: PORTABLE CHEST - 1 VIEW  COMPARISON:  10/01/2011.  FINDINGS: The heart remains normal in size. The lungs remain hyperexpanded. Diffuse increase in prominence of the pulmonary vasculature and interstitial markings. Interval patchy airspace opacity in the left lower lobe. Diffuse osteopenia.  IMPRESSION: 1. Patchy airspace opacity in the left lower lobe with an appearance most compatible with pneumonia. 2. Mild changes of congestive heart failure superimposed on COPD.   Electronically Signed   By: Gordan Payment M.D.   On: 07/02/2013 17:59    Medications:  Prior to Admission:  Prescriptions prior to admission  Medication Sig Dispense Refill  . albuterol (PROVENTIL HFA;VENTOLIN HFA) 108 (90 BASE) MCG/ACT inhaler Inhale 2 puffs into the lungs every 6 (six) hours as needed. For shortness of breath      . amLODipine (NORVASC) 5 MG tablet Take 5 mg by mouth every morning.      Marland Kitchen lisinopril (PRINIVIL,ZESTRIL) 20 MG tablet Take 20 mg by mouth every  morning.       . tiotropium (SPIRIVA) 18 MCG inhalation capsule Place 18 mcg into inhaler and inhale daily.      . traZODone (DESYREL) 50 MG tablet Take 50 mg by mouth at bedtime.       Scheduled: . enoxaparin (LOVENOX) injection  40 mg Subcutaneous Q24H  . ipratropium-albuterol  3 mL Nebulization Q4H  . levofloxacin (LEVAQUIN) IV  750 mg Intravenous Q24H  . methylPREDNISolone (SOLU-MEDROL) injection  60 mg Intravenous Q6H  . multivitamin with minerals  1 tablet Oral Daily  . nicotine  21 mg Transdermal Daily   Continuous: . sodium chloride 75 mL/hr at 07/02/13 2359   TAV:[id number], guaiFENesin-dextromethorphan  Assesment: He is admitted with community-acquired pneumonia. He has acute respiratory failure and is now requiring BiPAP. He has been encephalopathic which is probably related to his hypercapnia. He is much improved at this point. Has what seems to be relatively severe COPD. His blood gas on admission was suggested that he has chronic hypercapnic respiratory failure. Principal Problem:   Community acquired pneumonia Active Problems:   COPD with exacerbation   Tobacco abuse   Benign hypertension   Dyspnea   Acute respiratory failure with hypoxia and hypercarbia   Protein calorie malnutrition   Acute encephalopathy   Community acquired bacterial pneumonia   CAP (community acquired pneumonia)    Plan: He has a blood gas ordered for this morning but since he is so alert and awake I would probably follow him clinically. I think we may be we get him off of BiPAP later today. If he has any further problems with appearing sedated and somnolent then of course we would do another blood gas. Otherwise he is on appropriate treatment.  Thanks for allowing me to see him with you     LOS: 1 day   Fredirick Maudlin 07/03/2013, 7:27 AM

## 2013-07-04 DIAGNOSIS — R0989 Other specified symptoms and signs involving the circulatory and respiratory systems: Secondary | ICD-10-CM

## 2013-07-04 DIAGNOSIS — R0609 Other forms of dyspnea: Secondary | ICD-10-CM

## 2013-07-04 DIAGNOSIS — J159 Unspecified bacterial pneumonia: Principal | ICD-10-CM

## 2013-07-04 LAB — URINE CULTURE
CULTURE: NO GROWTH
Colony Count: NO GROWTH

## 2013-07-04 LAB — LEGIONELLA ANTIGEN, URINE: LEGIONELLA ANTIGEN, URINE: NEGATIVE

## 2013-07-04 MED ORDER — TRAZODONE HCL 50 MG PO TABS: 50.0000 mg | ORAL_TABLET | Freq: Every day | ORAL | Status: DC

## 2013-07-04 MED ORDER — CYANOCOBALAMIN 1000 MCG/ML IJ SOLN
1000.0000 ug | Freq: Every day | INTRAMUSCULAR | Status: DC
  Administered 2013-07-04 – 2013-07-05 (×2): 1000 ug via SUBCUTANEOUS
  Filled 2013-07-04 (×2): qty 1

## 2013-07-04 MED ORDER — LISINOPRIL 10 MG PO TABS
20.0000 mg | ORAL_TABLET | Freq: Every morning | ORAL | Status: DC
  Administered 2013-07-04 – 2013-07-05 (×2): 20 mg via ORAL
  Filled 2013-07-04 (×2): qty 2

## 2013-07-04 MED ORDER — PREDNISONE 20 MG PO TABS
60.0000 mg | ORAL_TABLET | Freq: Every day | ORAL | Status: DC
  Administered 2013-07-05: 60 mg via ORAL
  Filled 2013-07-04: qty 6

## 2013-07-04 MED ORDER — DIPHENHYDRAMINE HCL 25 MG PO CAPS
25.0000 mg | ORAL_CAPSULE | Freq: Four times a day (QID) | ORAL | Status: DC | PRN
  Administered 2013-07-04: 25 mg via ORAL
  Filled 2013-07-04: qty 1

## 2013-07-04 MED ORDER — HYDROCORTISONE 0.5 % EX CREA
TOPICAL_CREAM | Freq: Three times a day (TID) | CUTANEOUS | Status: DC
  Filled 2013-07-04: qty 28.35

## 2013-07-04 MED ORDER — MOMETASONE FURO-FORMOTEROL FUM 100-5 MCG/ACT IN AERO
2.0000 | INHALATION_SPRAY | Freq: Two times a day (BID) | RESPIRATORY_TRACT | Status: DC
  Administered 2013-07-04 – 2013-07-05 (×2): 2 via RESPIRATORY_TRACT
  Filled 2013-07-04: qty 8.8

## 2013-07-04 MED ORDER — TIOTROPIUM BROMIDE MONOHYDRATE 18 MCG IN CAPS
18.0000 ug | ORAL_CAPSULE | Freq: Every day | RESPIRATORY_TRACT | Status: DC
  Administered 2013-07-05: 18 ug via RESPIRATORY_TRACT
  Filled 2013-07-04: qty 5

## 2013-07-04 MED ORDER — IPRATROPIUM-ALBUTEROL 0.5-2.5 (3) MG/3ML IN SOLN
3.0000 mL | Freq: Four times a day (QID) | RESPIRATORY_TRACT | Status: DC
  Administered 2013-07-04 – 2013-07-05 (×5): 3 mL via RESPIRATORY_TRACT
  Filled 2013-07-04 (×5): qty 3

## 2013-07-04 MED ORDER — AMLODIPINE BESYLATE 5 MG PO TABS
5.0000 mg | ORAL_TABLET | Freq: Every morning | ORAL | Status: DC
  Administered 2013-07-04 – 2013-07-05 (×2): 5 mg via ORAL
  Filled 2013-07-04 (×2): qty 1

## 2013-07-04 NOTE — Progress Notes (Signed)
[facility] Progress Note   Shane Todd WUJ:[id number] DOB: 1945-09-10 DOA: 07/02/2013 PCP: Kirk Ruths, MD  Brief narrative: Shane Todd is a 68 y.o. male presenting on 07/02/2013 with a history of hypertension, COPD with heavy tobacco use, history of pneumonia, GERD who was brought to the ED by his wife as patient was found to be hypoxic at home. He is admitted for CAP and a COPD exacerbation.     Subjective: Feeling much better - asking to go home  Assessment/Plan: Principal Problem   Acute respiratory failure with hypoxia and hypercarbia   (a) Community acquired pneumonia- agree with Levaquin    (b) COPD with exacerbation- off BiPAP - appears to be doing well. Will switch to Prednisone in the AM- ambulate  Active Problems:  Dehydration - will d/c NS today as he is well hydrated now    Tobacco abuse - have asvised him to quit    Benign hypertension - stable    Acute encephalopathy - possibly due to acute infection and hypercarbia and hypoxia - resolved  Code Status: Full code Family Communication: with wife Disposition Plan: home  Consultants: Pulm  Procedures: none  Antibiotics: Antibiotics Given (last 72 hours)   Date/Time Action Medication Dose Rate   07/03/13 1722 Given   levofloxacin (LEVAQUIN) IVPB 750 mg 750 mg 100 mL/hr       DVT prophylaxis: Lovenox  Objective: Filed Weights   07/03/13 0001 07/03/13 0625 07/04/13 0600  Weight: 68.04 kg (150 lb) 65.8 kg (145 lb 1 oz) 69.1 kg (152 lb 5.4 oz)   Filed Vitals:   07/04/13 0752 07/04/13 0900 07/04/13 1000 07/04/13 1052  BP:  148/61 166/69   Pulse:   99 104  Temp:      TempSrc:      Resp:  28 21 19   Height:      Weight:      SpO2: 95%  95% 96%      Intake/Output Summary (Last 24 hours) at 07/04/13 1207 Last data filed at 07/04/13 1000  Gross per 24 hour  Intake   3330 ml  Output   1925 ml  Net   1405 ml     Exam: General: No acute respiratory distress Lungs:  coarse breath sounds Cardiovascular: Regular rate and rhythm without murmur gallop or rub normal S1 and S2 Abdomen: Nontender, nondistended, soft, bowel sounds positive, no rebound, no ascites, no appreciable mass Extremities: No significant cyanosis, clubbing, or edema bilateral lower extremities  Data Reviewed: Basic Metabolic Panel:  Recent Labs Lab 07/02/13 1736  NA 140  K 4.1  CL 94*  CO2 40*  GLUCOSE 101*  BUN 32*  CREATININE 0.78  CALCIUM 8.9   Liver Function Tests: No results found for this basename: AST, ALT, ALKPHOS, BILITOT, PROT, ALBUMIN,  in the last 168 hours No results found for this basename: LIPASE, AMYLASE,  in the last 168 hours No results found for this basename: AMMONIA,  in the last 168 hours CBC:  Recent Labs Lab 07/02/13 1736  WBC 8.7  NEUTROABS 7.1  HGB 13.1  HCT 43.1  MCV 99.3  PLT 334   Cardiac Enzymes:  Recent Labs Lab 07/02/13 1736  TROPONINI <0.30   BNP (last 3 results)  Recent Labs  07/02/13 1736  PROBNP 726.2*   CBG: No results found for this basename: GLUCAP,  in the last 168 hours  Recent Results (from the past 240 hour(s))  CULTURE, BLOOD (ROUTINE X 2)     Status:  None   Collection Time    07/02/13 11:37 PM      Result Value Ref Range Status   Specimen Description BLOOD LEFT ANTECUBITAL   Final   Special Requests     Final   Value: BOTTLES DRAWN AEROBIC AND ANAEROBIC AEB 8CC ANA 6CC   Culture NO GROWTH 1 DAY   Final   Report Status PENDING   Incomplete  CULTURE, BLOOD (ROUTINE X 2)     Status: None   Collection Time    07/02/13 11:37 PM      Result Value Ref Range Status   Specimen Description BLOOD LEFT FOREARM   Final   Special Requests     Final   Value: BOTTLES DRAWN AEROBIC AND ANAEROBIC AEB 8CC ANA 6CC   Culture NO GROWTH 1 DAY   Final   Report Status PENDING   Incomplete  MRSA PCR SCREENING     Status: None   Collection Time    07/03/13  6:26 AM      Result Value Ref Range Status   MRSA by PCR  NEGATIVE  NEGATIVE Final   Comment:            The GeneXpert MRSA Assay (FDA     approved for NASAL specimens     only), is one component of a     comprehensive MRSA colonization     surveillance program. It is not     intended to diagnose MRSA     infection nor to guide or     monitor treatment for     MRSA infections.     Studies:  Recent x-ray studies have been reviewed in detail by the Attending Physician  Scheduled Meds:  Scheduled Meds: . amLODipine  5 mg Oral q morning - 10a  . enoxaparin (LOVENOX) injection  40 mg Subcutaneous Q24H  . ipratropium-albuterol  3 mL Nebulization Q6H  . levofloxacin (LEVAQUIN) IV  750 mg Intravenous Q24H  . lisinopril  20 mg Oral q morning - 10a  . multivitamin with minerals  1 tablet Oral Daily  . nicotine  21 mg Transdermal Daily  . [START ON 07/05/2013] predniSONE  60 mg Oral Q breakfast  . traZODone  50 mg Oral QHS   Continuous Infusions:    Time spent on care of this patient: 35 min   Calvert CantorSaima Jemeka Wagler, MD 07/04/2013, 12:07 PM  LOS: 2 days   [facility] Office  [phone] Pager - Text Page per Loretha StaplerAmion   If 7PM-7AM, please contact night-coverage [email or web address]

## 2013-07-04 NOTE — Progress Notes (Signed)
Pt is A&O and steady on his feet.  Ambulates with slow steady pace in room.  Patient is very upset about having to have chair alarm and bed alarm on.  Patient states if he has to have the alarms he will leave AMA.  Educated on need for alarms and fall prevention.  Pt quite agitated and states he will not have them on or he will leave AMA.  Call light is within reach, patient states he will call to get out of the chair or bed and verbalizes understanding of the risks of not using bed/chair alarms.

## 2013-07-04 NOTE — Progress Notes (Signed)
PT IS TRANSFERRING TO ROOM 320 AS MED/SURG PT. HE IS ALERT AND ORIENTED. DIMINISHED BREATH SOUNDS NO COUGH OR SOB.RT AC IV INFUSING W/O DIFFICULTY.TRANSFER REPORT CALLED TO JESSICA BUCNER RN ON 300.

## 2013-07-04 NOTE — Evaluation (Signed)
Physical Therapy Evaluation Patient Details Name: SEDERICK LASTINGER MRN: [id number] DOB: 09-18-1945 Today's Date: 07/04/2013   History of Present Illness  KAYCEON AGUINO is a 68 y.o. male presenting on 07/02/2013 with a history of hypertension, COPD with heavy tobacco use, history of pneumonia, GERD who was brought to the ED by his wife as patient was found to be hypoxic at home. He is admitted for CAP and a COPD exacerbation.   Impression: Patient screened with no needs identifies patient is at baseline. Patient able to stand on one leg with 1 UE support,to ambulate up and down stairs into home.    Savaughn Karwowski R Jacquetta Polhamus PT DPT 07/04/2013, 2:42 PM

## 2013-07-04 NOTE — Progress Notes (Signed)
Subjective: He feels better. He still has some wheezing. He has no other new complaints.  Objective: Vital signs in last 24 hours: Temp:  [97.7 F (36.5 C)-98.3 F (36.8 C)] 97.7 F (36.5 C) (05/23 0730) Pulse Rate:  [84-114] 96 (05/23 0640) Resp:  [13-28] 16 (05/23 0640) BP: (66-172)/(42-111) 164/81 mmHg (05/23 0640) SpO2:  [88 %-98 %] 95 % (05/23 0752) Weight:  [69.1 kg (152 lb 5.4 oz)] 69.1 kg (152 lb 5.4 oz) (05/23 0600) Weight change: 1.061 kg (2 lb 5.4 oz) Last BM Date: 07/04/13  Intake/Output from previous day: 05/22 0701 - 05/23 0700 In: 3870 [P.O.:1920; I.V.:1800; IV Piggyback:150] Out: 2125 [Urine:2125]  PHYSICAL EXAM General appearance: alert, cooperative and no distress Resp: He has some wheezing and looks mildly short of breath at rest Cardio: regular rate and rhythm, S1, S2 normal, no murmur, click, rub or gallop GI: soft, non-tender; bowel sounds normal; no masses,  no organomegaly Extremities: extremities normal, atraumatic, no cyanosis or edema  Lab Results:    Basic Metabolic Panel:  Recent Labs  16/10/96 1736  NA 140  K 4.1  CL 94*  CO2 40*  GLUCOSE 101*  BUN 32*  CREATININE 0.78  CALCIUM 8.9   Liver Function Tests: No results found for this basename: AST, ALT, ALKPHOS, BILITOT, PROT, ALBUMIN,  in the last 72 hours No results found for this basename: LIPASE, AMYLASE,  in the last 72 hours No results found for this basename: AMMONIA,  in the last 72 hours CBC:  Recent Labs  07/02/13 1736  WBC 8.7  NEUTROABS 7.1  HGB 13.1  HCT 43.1  MCV 99.3  PLT 334   Cardiac Enzymes:  Recent Labs  07/02/13 1736  TROPONINI <0.30   BNP:  Recent Labs  07/02/13 1736  PROBNP 726.2*   D-Dimer: No results found for this basename: DDIMER,  in the last 72 hours CBG: No results found for this basename: GLUCAP,  in the last 72 hours Hemoglobin A1C: No results found for this basename: HGBA1C,  in the last 72 hours Fasting Lipid Panel: No  results found for this basename: CHOL, HDL, LDLCALC, TRIG, CHOLHDL, LDLDIRECT,  in the last 72 hours Thyroid Function Tests:  Recent Labs  07/02/13 2200  TSH 0.515   Anemia Panel:  Recent Labs  07/02/13 2337  VITAMINB12 407   Coagulation: No results found for this basename: LABPROT, INR,  in the last 72 hours Urine Drug Screen: Drugs of Abuse     Component Value Date/Time   LABOPIA NONE DETECTED 04/23/2011 1259   COCAINSCRNUR NONE DETECTED 04/23/2011 1259   LABBENZ NONE DETECTED 04/23/2011 1259   AMPHETMU NONE DETECTED 04/23/2011 1259   THCU NONE DETECTED 04/23/2011 1259   LABBARB NONE DETECTED 04/23/2011 1259    Alcohol Level: No results found for this basename: ETH,  in the last 72 hours Urinalysis:  Recent Labs  07/03/13 0425  COLORURINE YELLOW  LABSPEC >1.030*  PHURINE 5.5  GLUCOSEU 100*  HGBUR NEGATIVE  BILIRUBINUR NEGATIVE  KETONESUR NEGATIVE  PROTEINUR NEGATIVE  UROBILINOGEN 0.2  NITRITE NEGATIVE  LEUKOCYTESUR NEGATIVE   Misc. Labs:  ABGS  Recent Labs  07/03/13 0450  PHART 7.233*  PO2ART 83.2  TCO2 33.3  HCO3 37.6*   CULTURES Recent Results (from the past 240 hour(s))  CULTURE, BLOOD (ROUTINE X 2)     Status: None   Collection Time    07/02/13 11:37 PM      Result Value Ref Range Status   Specimen Description  BLOOD LEFT ANTECUBITAL   Final   Special Requests     Final   Value: BOTTLES DRAWN AEROBIC AND ANAEROBIC AEB 8CC ANA 6CC   Culture NO GROWTH 1 DAY   Final   Report Status PENDING   Incomplete  CULTURE, BLOOD (ROUTINE X 2)     Status: None   Collection Time    07/02/13 11:37 PM      Result Value Ref Range Status   Specimen Description BLOOD LEFT FOREARM   Final   Special Requests     Final   Value: BOTTLES DRAWN AEROBIC AND ANAEROBIC AEB 8CC ANA 6CC   Culture NO GROWTH 1 DAY   Final   Report Status PENDING   Incomplete  MRSA PCR SCREENING     Status: None   Collection Time    07/03/13  6:26 AM      Result Value Ref Range Status    MRSA by PCR NEGATIVE  NEGATIVE Final   Comment:            The GeneXpert MRSA Assay (FDA     approved for NASAL specimens     only), is one component of a     comprehensive MRSA colonization     surveillance program. It is not     intended to diagnose MRSA     infection nor to guide or     monitor treatment for     MRSA infections.   Studies/Results: Ct Head Wo Contrast  07/02/2013   CLINICAL DATA:  Altered mental status.  EXAM: CT HEAD WITHOUT CONTRAST  TECHNIQUE: Contiguous axial images were obtained from the base of the skull through the vertex without intravenous contrast.  COMPARISON:  Head CT 04/23/2011.  FINDINGS: Patchy and confluent areas of decreased attenuation are noted throughout the deep and periventricular white matter of the cerebral hemispheres bilaterally, compatible with chronic microvascular ischemic disease. No acute intracranial abnormalities. Specifically, no evidence of acute intracranial hemorrhage, no definite findings of acute/subacute cerebral ischemia, no mass, mass effect, hydrocephalus or abnormal intra or extra-axial fluid collections. Visualized paranasal sinuses and mastoids are well pneumatized. No acute displaced skull fractures are identified.  IMPRESSION: 1. No acute intracranial abnormalities. 2. Chronic microvascular ischemic changes in the cerebral white matter, similar to the prior examination, as above.   Electronically Signed   By: Trudie Reed M.D.   On: 07/02/2013 22:37   Dg Chest Port 1 View  07/02/2013   CLINICAL DATA:  Shortness of breath. Hypoxia. Acute respiratory failure.  EXAM: PORTABLE CHEST - 1 VIEW  COMPARISON:  10/01/2011.  FINDINGS: The heart remains normal in size. The lungs remain hyperexpanded. Diffuse increase in prominence of the pulmonary vasculature and interstitial markings. Interval patchy airspace opacity in the left lower lobe. Diffuse osteopenia.  IMPRESSION: 1. Patchy airspace opacity in the left lower lobe with an appearance  most compatible with pneumonia. 2. Mild changes of congestive heart failure superimposed on COPD.   Electronically Signed   By: Gordan Payment M.D.   On: 07/02/2013 17:59    Medications:  Prior to Admission:  Prescriptions prior to admission  Medication Sig Dispense Refill  . albuterol (PROVENTIL HFA;VENTOLIN HFA) 108 (90 BASE) MCG/ACT inhaler Inhale 2 puffs into the lungs every 6 (six) hours as needed. For shortness of breath      . amLODipine (NORVASC) 5 MG tablet Take 5 mg by mouth every morning.      Marland Kitchen lisinopril (PRINIVIL,ZESTRIL) 20 MG tablet Take 20 mg  by mouth every morning.       . tiotropium (SPIRIVA) 18 MCG inhalation capsule Place 18 mcg into inhaler and inhale daily.      . traZODone (DESYREL) 50 MG tablet Take 50 mg by mouth at bedtime.       Scheduled: . enoxaparin (LOVENOX) injection  40 mg Subcutaneous Q24H  . ipratropium-albuterol  3 mL Nebulization Q6H  . levofloxacin (LEVAQUIN) IV  750 mg Intravenous Q24H  . methylPREDNISolone (SOLU-MEDROL) injection  60 mg Intravenous Q6H  . multivitamin with minerals  1 tablet Oral Daily  . nicotine  21 mg Transdermal Daily  . traZODone  50 mg Oral QHS   Continuous: . sodium chloride 75 mL/hr at 07/04/13 0800   WUJ:[id number]:albuterol, guaiFENesin-dextromethorphan  Assesment: He was admitted with community-acquired pneumonia and is known to have COPD. He had acute respiratory failure which was hypoxic and hypercarbic but he is better now. I think his COPD is a significant problem. Principal Problem:   Community acquired pneumonia Active Problems:   COPD with exacerbation   Tobacco abuse   Benign hypertension   Dyspnea   Acute respiratory failure with hypoxia and hypercarbia   Protein calorie malnutrition   Acute encephalopathy   Community acquired bacterial pneumonia   CAP (community acquired pneumonia)    Plan: Continue current treatments.    LOS: 2 days   Shane Todd 07/04/2013, 9:31 AM

## 2013-07-04 NOTE — Progress Notes (Signed)
Pt refuses bed and chair alarm. Pt grabs onto walls when ambulating. Pt offered a walker to help steady himself and he refuses also. Pt educated and reassured that he is not a bother and that we are trying to prevent him from falling or breaking a bone. Pt still insists that he does not want any alarms on. Frequent rounds will be made. Will continue to monitor.

## 2013-07-05 LAB — BASIC METABOLIC PANEL
BUN: 24 mg/dL — ABNORMAL HIGH (ref 6–23)
CALCIUM: 8.7 mg/dL (ref 8.4–10.5)
CO2: 36 mEq/L — ABNORMAL HIGH (ref 19–32)
CREATININE: 0.83 mg/dL (ref 0.50–1.35)
Chloride: 96 mEq/L (ref 96–112)
GFR calc non Af Amer: 88 mL/min — ABNORMAL LOW (ref >90)
Glucose, Bld: 92 mg/dL (ref 70–99)
Potassium: 3.7 mEq/L (ref 3.7–5.3)
Sodium: 137 mEq/L (ref 137–147)

## 2013-07-05 MED ORDER — NICOTINE 21 MG/24HR TD PT24: 21.0000 mg | MEDICATED_PATCH | Freq: Every day | TRANSDERMAL | Status: DC

## 2013-07-05 MED ORDER — NICOTINE 7 MG/24HR TD PT24: 7.0000 mg | MEDICATED_PATCH | Freq: Every day | TRANSDERMAL | Status: DC

## 2013-07-05 MED ORDER — MOMETASONE FURO-FORMOTEROL FUM 100-5 MCG/ACT IN AERO: 2.0000 | INHALATION_SPRAY | Freq: Two times a day (BID) | RESPIRATORY_TRACT | Status: DC

## 2013-07-05 MED ORDER — IPRATROPIUM-ALBUTEROL 0.5-2.5 (3) MG/3ML IN SOLN: 3.0000 mL | Freq: Four times a day (QID) | RESPIRATORY_TRACT | Status: DC

## 2013-07-05 MED ORDER — LEVOFLOXACIN 750 MG PO TABS: 750.0000 mg | ORAL_TABLET | Freq: Every day | ORAL | Status: DC

## 2013-07-05 MED ORDER — HYDROCORTISONE 1 % EX CREA: TOPICAL_CREAM | Freq: Three times a day (TID) | CUTANEOUS | Status: DC

## 2013-07-05 MED ORDER — NICOTINE 14 MG/24HR TD PT24: 14.0000 mg | MEDICATED_PATCH | Freq: Every day | TRANSDERMAL | Status: DC

## 2013-07-05 MED ORDER — PREDNISONE 10 MG PO TABS: 60.0000 mg | ORAL_TABLET | Freq: Every day | ORAL | Status: DC

## 2013-07-05 NOTE — Progress Notes (Signed)
Patient ambulated in hall, approximately 19ft.  O2 sats fluctuated between 88% and 93% with ambulation.  Patient is not visibly short of breath with exertion.

## 2013-07-05 NOTE — Progress Notes (Signed)
IV removed, site WNL.  Pt and his wife given d/c instructions and new prescriptions.  Discussed all home medications (when, how, and why to take), patient verbalizes understanding. Discussed home care with patient, teachback completed. F/U appointment in place, pt states they will keep appointment. Pt is stable at this time. Pt taken to main entrance in wheelchair by staff member.

## 2013-07-05 NOTE — Progress Notes (Signed)
Pt  Back to bed with eyes closed. No sign of distress or confusion at this time.

## 2013-07-05 NOTE — Progress Notes (Signed)
Subjective: He says he feels well and wants to go home. He's been able to ambulate a little bit. His oxygen saturation is in the 88% range .   Objective: Vital signs in last 24 hours: Temp:  [97.7 F (36.5 C)-98 F (36.7 C)] 97.7 F (36.5 C) (05/24 0453) Pulse Rate:  [91-104] 91 (05/24 0453) Resp:  [18-28] 18 (05/24 0453) BP: (122-166)/(61-82) 137/72 mmHg (05/24 0453) SpO2:  [88 %-96 %] 88 % (05/24 0717) Weight change:  Last BM Date: 07/04/13  Intake/Output from previous day: 05/23 0701 - 05/24 0700 In: 1035 [P.O.:960; I.V.:75] Out: 1400 [Urine:1400]  PHYSICAL EXAM General appearance: alert, cooperative and no distress Resp: rhonchi bilaterally Cardio: regular rate and rhythm, S1, S2 normal, no murmur, click, rub or gallop GI: soft, non-tender; bowel sounds normal; no masses,  no organomegaly Extremities: extremities normal, atraumatic, no cyanosis or edema  Lab Results:    Basic Metabolic Panel:  Recent Labs  90/21/11 1736 07/05/13 0551  NA 140 137  K 4.1 3.7  CL 94* 96  CO2 40* 36*  GLUCOSE 101* 92  BUN 32* 24*  CREATININE 0.78 0.83  CALCIUM 8.9 8.7   Liver Function Tests: No results found for this basename: AST, ALT, ALKPHOS, BILITOT, PROT, ALBUMIN,  in the last 72 hours No results found for this basename: LIPASE, AMYLASE,  in the last 72 hours No results found for this basename: AMMONIA,  in the last 72 hours CBC:  Recent Labs  07/02/13 1736  WBC 8.7  NEUTROABS 7.1  HGB 13.1  HCT 43.1  MCV 99.3  PLT 334   Cardiac Enzymes:  Recent Labs  07/02/13 1736  TROPONINI <0.30   BNP:  Recent Labs  07/02/13 1736  PROBNP 726.2*   D-Dimer: No results found for this basename: DDIMER,  in the last 72 hours CBG: No results found for this basename: GLUCAP,  in the last 72 hours Hemoglobin A1C: No results found for this basename: HGBA1C,  in the last 72 hours Fasting Lipid Panel: No results found for this basename: CHOL, HDL, LDLCALC, TRIG,  CHOLHDL, LDLDIRECT,  in the last 72 hours Thyroid Function Tests:  Recent Labs  07/02/13 2200  TSH 0.515   Anemia Panel:  Recent Labs  07/02/13 2337  VITAMINB12 407   Coagulation: No results found for this basename: LABPROT, INR,  in the last 72 hours Urine Drug Screen: Drugs of Abuse     Component Value Date/Time   LABOPIA NONE DETECTED 04/23/2011 1259   COCAINSCRNUR NONE DETECTED 04/23/2011 1259   LABBENZ NONE DETECTED 04/23/2011 1259   AMPHETMU NONE DETECTED 04/23/2011 1259   THCU NONE DETECTED 04/23/2011 1259   LABBARB NONE DETECTED 04/23/2011 1259    Alcohol Level: No results found for this basename: ETH,  in the last 72 hours Urinalysis:  Recent Labs  07/03/13 0425  COLORURINE YELLOW  LABSPEC >1.030*  PHURINE 5.5  GLUCOSEU 100*  HGBUR NEGATIVE  BILIRUBINUR NEGATIVE  KETONESUR NEGATIVE  PROTEINUR NEGATIVE  UROBILINOGEN 0.2  NITRITE NEGATIVE  LEUKOCYTESUR NEGATIVE   Misc. Labs:  ABGS  Recent Labs  07/03/13 0450  PHART 7.233*  PO2ART 83.2  TCO2 33.3  HCO3 37.6*   CULTURES Recent Results (from the past 240 hour(s))  CULTURE, BLOOD (ROUTINE X 2)     Status: None   Collection Time    07/02/13 11:37 PM      Result Value Ref Range Status   Specimen Description BLOOD LEFT ANTECUBITAL   Final   Special  Requests     Final   Value: BOTTLES DRAWN AEROBIC AND ANAEROBIC AEB 8CC ANA 6CC   Culture NO GROWTH 1 DAY   Final   Report Status PENDING   Incomplete  CULTURE, BLOOD (ROUTINE X 2)     Status: None   Collection Time    07/02/13 11:37 PM      Result Value Ref Range Status   Specimen Description BLOOD LEFT FOREARM   Final   Special Requests     Final   Value: BOTTLES DRAWN AEROBIC AND ANAEROBIC AEB 8CC ANA 6CC   Culture NO GROWTH 1 DAY   Final   Report Status PENDING   Incomplete  URINE CULTURE     Status: None   Collection Time    07/03/13  4:25 AM      Result Value Ref Range Status   Specimen Description URINE, RANDOM   Final   Special Requests  NONE   Final   Culture  Setup Time     Final   Value: 07/03/2013 20:58     Performed at [facility] Count     Final   Value: NO GROWTH     Performed at [facility]   Culture     Final   Value: NO GROWTH     Performed at [facility]   Report Status 07/04/2013 FINAL   Final  MRSA PCR SCREENING     Status: None   Collection Time    07/03/13  6:26 AM      Result Value Ref Range Status   MRSA by PCR NEGATIVE  NEGATIVE Final   Comment:            The GeneXpert MRSA Assay (FDA     approved for NASAL specimens     only), is one component of a     comprehensive MRSA colonization     surveillance program. It is not     intended to diagnose MRSA     infection nor to guide or     monitor treatment for     MRSA infections.   Studies/Results: No results found.  Medications:  Prior to Admission:  Prescriptions prior to admission  Medication Sig Dispense Refill  . albuterol (PROVENTIL HFA;VENTOLIN HFA) 108 (90 BASE) MCG/ACT inhaler Inhale 2 puffs into the lungs every 6 (six) hours as needed. For shortness of breath      . amLODipine (NORVASC) 5 MG tablet Take 5 mg by mouth every morning.      Marland Kitchen lisinopril (PRINIVIL,ZESTRIL) 20 MG tablet Take 20 mg by mouth every morning.       . tiotropium (SPIRIVA) 18 MCG inhalation capsule Place 18 mcg into inhaler and inhale daily.      . traZODone (DESYREL) 50 MG tablet Take 50 mg by mouth at bedtime.       Scheduled: . amLODipine  5 mg Oral q morning - 10a  . cyanocobalamin  1,000 mcg Subcutaneous Daily  . enoxaparin (LOVENOX) injection  40 mg Subcutaneous Q24H  . hydrocortisone cream   Topical TID  . ipratropium-albuterol  3 mL Nebulization Q6H  . levofloxacin (LEVAQUIN) IV  750 mg Intravenous Q24H  . lisinopril  20 mg Oral q morning - 10a  . mometasone-formoterol  2 puff Inhalation BID  . multivitamin with minerals  1 tablet Oral Daily  . nicotine  21 mg Transdermal Daily  . predniSONE  60 mg Oral Q  breakfast  .  tiotropium  18 mcg Inhalation Daily  . traZODone  50 mg Oral QHS   Continuous:  ZOX:[id number]:albuterol, diphenhydrAMINE, guaiFENesin-dextromethorphan  Assesment: He is admitted with community-acquired pneumonia. He has severe COPD at baseline. He developed acute on chronic hypercarbic hypoxic respiratory failure but is better. He was acutely encephalopathic from that and that has improved. He does have some malnutrition. He tells me he will make a serious effort to stop smoking Principal Problem:   Community acquired pneumonia Active Problems:   COPD with exacerbation   Tobacco abuse   Benign hypertension   Dyspnea   Acute respiratory failure with hypoxia and hypercarbia   Protein calorie malnutrition   Acute encephalopathy   Community acquired bacterial pneumonia   CAP (community acquired pneumonia)    Plan: From a strictly pulmonary point of view he is okay for discharge but I think he may need oxygen. Discussed with Dr. Butler Denmarkizwan    LOS: 3 days   Fredirick MaudlinEdward L Darrly Loberg 07/05/2013, 7:52 AM

## 2013-07-05 NOTE — Progress Notes (Signed)
Patient reeducated on safety plan.  Call light in reach, patient adamantly refuses bed/chair alarms. Alert and oriented.

## 2013-07-05 NOTE — Progress Notes (Signed)
Pt up sitting on side of bed. Pleasant. Less confused than before. States he sleep for a little while. No distress noted. Will continue to monitor.

## 2013-07-05 NOTE — Progress Notes (Signed)
Pt up walking around room. States he feels confused. He is pleasant but states he just has not been able to sleep. Pt still refuses bed and chair alarm. Will continue to monitor.

## 2013-07-05 NOTE — Progress Notes (Signed)
PHARMACIST - PHYSICIAN COMMUNICATION DR:   Idelle Leech CONCERNING: Antibiotic IV to Oral Route Change Policy  RECOMMENDATION: This patient is receiving Levaquin by the intravenous route.  Based on criteria approved by the Pharmacy and Therapeutics Committee, the antibiotic(s) is/are being converted to the equivalent oral dose form(s).  DESCRIPTION: These criteria include:  Patient being treated for a respiratory tract infection, urinary tract infection, cellulitis or clostridium difficile associated diarrhea if on metronidazole  The patient is not neutropenic and does not exhibit a GI malabsorption state  The patient is eating (either orally or via tube) and/or has been taking other orally administered medications for a least 24 hours  The patient is improving clinically and has a Tmax < 100.5  If you have questions about this conversion, please contact the Pharmacy Department  [x]   [phone] )  [facility] []   [phone] )  [facility]  []   [phone] )  [facility] []   [phone] )  [facility]    S. Margo Aye, PharmD

## 2013-07-07 LAB — CULTURE, RESPIRATORY
CULTURE: NORMAL
GRAM STAIN: NONE SEEN

## 2013-07-09 LAB — CULTURE, BLOOD (ROUTINE X 2)
CULTURE: NO GROWTH
Culture: NO GROWTH

## 2013-07-25 NOTE — Discharge Summary (Addendum)
Physician Discharge Summary  Shane GeroldMack A Salamon UJW:[id number] DOB: 1945-05-20 DOA: 07/02/2013  PCP: Kirk RuthsMCGOUGH,WILLIAM M, MD  Admit date: 07/02/2013 Discharge date: 07/05/2013  Time spent: >45 minutes  Discharge Diagnoses:  Principal Problem:   Community acquired pneumonia Active Problems:   COPD with exacerbation   Tobacco abuse   Benign hypertension   Dyspnea   Acute respiratory failure with hypoxia and hypercarbia   Protein calorie malnutrition   Acute encephalopathy   Community acquired bacterial pneumonia   CAP (community acquired pneumonia)   Discharge Condition: stable  Diet recommendation: heart healthy  Filed Weights   07/03/13 0001 07/03/13 0625 07/04/13 0600  Weight: 68.04 kg (150 lb) 65.8 kg (145 lb 1 oz) 69.1 kg (152 lb 5.4 oz)    History of present illness:  Shane Todd is a 68 y.o. male presenting on 07/02/2013 with a history of hypertension, COPD with heavy tobacco use, history of pneumonia, GERD who was brought to the ED by his wife as patient was found to be hypoxic at home. He is admitted for CAP and a COPD exacerbation   Hospital Course:  Principal Problem  Acute respiratory failure with hypoxia and hypercarbia  (a) Community acquired pneumonia- agree with Levaquin with which he will be discharged (b) COPD with exacerbation- needed BiPAP - appears to be doing well. D/c home on Prednisone taper  Dehydration  - hydrated with d/c NS today and now euvolemic  Tobacco abuse  - have asvised him to quit   Benign hypertension  - stable   Acute encephalopathy  - possibly due to acute infection and hypercarbia and hypoxia  - resolved   Procedures:  none  Consultations:  pulmonary  Discharge Exam: Filed Vitals:   07/05/13 0453  BP: 137/72  Pulse: 91  Temp: 97.7 F (36.5 C)  Resp: 18   General: No acute respiratory distress  Lungs: coarse breath sounds  Cardiovascular: Regular rate and rhythm without murmur gallop or rub normal S1 and S2   Abdomen: Nontender, nondistended, soft, bowel sounds positive, no rebound, no ascites, no appreciable mass  Extremities: No significant cyanosis, clubbing, or edema bilateral lower extremities   Discharge Instructions You were cared for by a hospitalist during your hospital stay. If you have any questions about your discharge medications or the care you received while you were in the hospital after you are discharged, you can call the unit and asked to speak with the hospitalist on call if the hospitalist that took care of you is not available. Once you are discharged, your primary care physician will handle any further medical issues. Please note that NO REFILLS for any discharge medications will be authorized once you are discharged, as it is imperative that you return to your primary care physician (or establish a relationship with a primary care physician if you do not have one) for your aftercare needs so that they can reassess your need for medications and monitor your lab values.  Discharge Instructions   Diet - low sodium heart healthy    Complete by:  As directed      Increase activity slowly    Complete by:  As directed             Medication List         albuterol 108 (90 BASE) MCG/ACT inhaler  Commonly known as:  PROVENTIL HFA;VENTOLIN HFA  Inhale 2 puffs into the lungs every 6 (six) hours as needed. For shortness of breath     amLODipine 5  MG tablet  Commonly known as:  NORVASC  Take 5 mg by mouth every morning.     ipratropium-albuterol 0.5-2.5 (3) MG/3ML Soln  Commonly known as:  DUONEB  Take 3 mLs by nebulization every 6 (six) hours.     levofloxacin 750 MG tablet  Commonly known as:  LEVAQUIN  Take 1 tablet (750 mg total) by mouth daily at 6 PM.     lisinopril 20 MG tablet  Commonly known as:  PRINIVIL,ZESTRIL  Take 20 mg by mouth every morning.     mometasone-formoterol 100-5 MCG/ACT Aero  Commonly known as:  DULERA  Inhale 2 puffs into the lungs 2 (two)  times daily.     nicotine 21 mg/24hr patch  Commonly known as:  NICODERM CQ - dosed in mg/24 hours  Place 1 patch (21 mg total) onto the skin daily.     nicotine 14 mg/24hr patch  Commonly known as:  NICODERM CQ  Place 1 patch (14 mg total) onto the skin daily.     nicotine 7 mg/24hr patch  Commonly known as:  NICODERM CQ  Place 1 patch (7 mg total) onto the skin daily.     predniSONE 10 MG tablet  Commonly known as:  DELTASONE  Take 6 tablets (60 mg total) by mouth daily with breakfast.     tiotropium 18 MCG inhalation capsule  Commonly known as:  SPIRIVA  Place 18 mcg into inhaler and inhale daily.     traZODone 50 MG tablet  Commonly known as:  DESYREL  Take 50 mg by mouth at bedtime.       Allergies  Allergen Reactions  . Oxycodone Nausea And Vomiting       Follow-up Information   Follow up with Kirk Ruths, MD In 1 week.   Specialty:  Family Medicine   Contact information:   [address] [place] [address] [phone]        The results of significant diagnostics from this hospitalization (including imaging, microbiology, ancillary and laboratory) are listed below for reference.    Significant Diagnostic Studies: Ct Head Wo Contrast  07/02/2013   CLINICAL DATA:  Altered mental status.  EXAM: CT HEAD WITHOUT CONTRAST  TECHNIQUE: Contiguous axial images were obtained from the base of the skull through the vertex without intravenous contrast.  COMPARISON:  Head CT 04/23/2011.  FINDINGS: Patchy and confluent areas of decreased attenuation are noted throughout the deep and periventricular white matter of the cerebral hemispheres bilaterally, compatible with chronic microvascular ischemic disease. No acute intracranial abnormalities. Specifically, no evidence of acute intracranial hemorrhage, no definite findings of acute/subacute cerebral ischemia, no mass, mass effect, hydrocephalus or abnormal intra or extra-axial fluid collections. Visualized  paranasal sinuses and mastoids are well pneumatized. No acute displaced skull fractures are identified.  IMPRESSION: 1. No acute intracranial abnormalities. 2. Chronic microvascular ischemic changes in the cerebral white matter, similar to the prior examination, as above.   Electronically Signed   By: Trudie Reed M.D.   On: 07/02/2013 22:37   Dg Chest Port 1 View  07/02/2013   CLINICAL DATA:  Shortness of breath. Hypoxia. Acute respiratory failure.  EXAM: PORTABLE CHEST - 1 VIEW  COMPARISON:  10/01/2011.  FINDINGS: The heart remains normal in size. The lungs remain hyperexpanded. Diffuse increase in prominence of the pulmonary vasculature and interstitial markings. Interval patchy airspace opacity in the left lower lobe. Diffuse osteopenia.  IMPRESSION: 1. Patchy airspace opacity in the left lower lobe with an appearance most compatible with  pneumonia. 2. Mild changes of congestive heart failure superimposed on COPD.   Electronically Signed   By: Gordan PaymentSteve  Reid M.D.   On: 07/02/2013 17:59    Microbiology: No results found for this or any previous visit (from the past 240 hour(s)).   Labs: Basic Metabolic Panel: No results found for this basename: NA, K, CL, CO2, GLUCOSE, BUN, CREATININE, CALCIUM, MG, PHOS,  in the last 168 hours Liver Function Tests: No results found for this basename: AST, ALT, ALKPHOS, BILITOT, PROT, ALBUMIN,  in the last 168 hours No results found for this basename: LIPASE, AMYLASE,  in the last 168 hours No results found for this basename: AMMONIA,  in the last 168 hours CBC: No results found for this basename: WBC, NEUTROABS, HGB, HCT, MCV, PLT,  in the last 168 hours Cardiac Enzymes: No results found for this basename: CKTOTAL, CKMB, CKMBINDEX, TROPONINI,  in the last 168 hours BNP: BNP (last 3 results)  Recent Labs  07/02/13 1736  PROBNP 726.2*   CBG: No results found for this basename: GLUCAP,  in the last 168 hours     Signed:  Calvert CantorIZWAN,Valeria Boza, MD [facility]  07/04/2013, 3:38 PM

## 2014-09-17 ENCOUNTER — Encounter (INDEPENDENT_AMBULATORY_CARE_PROVIDER_SITE_OTHER): Payer: Self-pay | Admitting: *Deleted

## 2014-10-20 ENCOUNTER — Institutional Professional Consult (permissible substitution): Payer: Medicare Other | Admitting: Pulmonary Disease

## 2014-10-28 ENCOUNTER — Encounter (INDEPENDENT_AMBULATORY_CARE_PROVIDER_SITE_OTHER): Payer: Self-pay | Admitting: Internal Medicine

## 2014-10-28 ENCOUNTER — Ambulatory Visit (INDEPENDENT_AMBULATORY_CARE_PROVIDER_SITE_OTHER): Payer: Medicare Other | Admitting: Internal Medicine

## 2014-10-28 VITALS — BP 158/72 | HR 72 | Temp 97.7°F | Ht 72.0 in | Wt 146.0 lb

## 2014-10-28 DIAGNOSIS — K5909 Other constipation: Secondary | ICD-10-CM

## 2014-10-28 LAB — CBC WITH DIFFERENTIAL/PLATELET
BASOS PCT: 1 % (ref 0–1)
Basophils Absolute: 0.1 10*3/uL (ref 0.0–0.1)
Eosinophils Absolute: 0.1 10*3/uL (ref 0.0–0.7)
Eosinophils Relative: 2 % (ref 0–5)
HCT: 44.6 % (ref 39.0–52.0)
Hemoglobin: 14.9 g/dL (ref 13.0–17.0)
Lymphocytes Relative: 15 % (ref 12–46)
Lymphs Abs: 1.1 10*3/uL (ref 0.7–4.0)
MCH: 30.6 pg (ref 26.0–34.0)
MCHC: 33.4 g/dL (ref 30.0–36.0)
MCV: 91.6 fL (ref 78.0–100.0)
MONO ABS: 0.7 10*3/uL (ref 0.1–1.0)
MPV: 9.2 fL (ref 8.6–12.4)
Monocytes Relative: 9 % (ref 3–12)
NEUTROS ABS: 5.3 10*3/uL (ref 1.7–7.7)
NEUTROS PCT: 73 % (ref 43–77)
Platelets: 188 10*3/uL (ref 150–400)
RBC: 4.87 MIL/uL (ref 4.22–5.81)
RDW: 12.9 % (ref 11.5–15.5)
WBC: 7.3 10*3/uL (ref 4.0–10.5)

## 2014-10-28 NOTE — Progress Notes (Signed)
Subjective:    Patient ID: Shane Todd, male    DOB: 06/28/1945, 69 y.o.   MRN: [id number]  HPI Referred to our office by Dr. Theodis Shove for constipation/screening colonoscopy.  He had a colonoscopy was in 2009  by Dr. Lovell Sheehan and a polyp was removed. ( I could only find Biopsy report: Polyp was hyperplastic) He tells me has constipation. He has had constipation off and on for a couple of years.He takes a laxative as needed  Hernia repair a couple of years a go by Dr. Magnus Ivan He usually has a BM every 3-4 days. No melena or BRRB. No abdominal pain unless he takes a laxative. Appetite is good. No recent weight loss. He says he has gradually lost weight after retiring.   CBC    Component Value Date/Time   WBC 8.7 07/02/2013 1736   RBC 4.34 07/02/2013 1736   HGB 13.1 07/02/2013 1736   HCT 43.1 07/02/2013 1736   PLT 334 07/02/2013 1736   MCV 99.3 07/02/2013 1736   MCH 30.2 07/02/2013 1736   MCHC 30.4 07/02/2013 1736   RDW 12.9 07/02/2013 1736   LYMPHSABS 0.6* 07/02/2013 1736   MONOABS 0.8 07/02/2013 1736   EOSABS 0.1 07/02/2013 1736   BASOSABS 0.0 07/02/2013 1736       Review of Systems Past Medical History  Diagnosis Date  . COPD (chronic obstructive pulmonary disease)   . Hypertension   . Tobacco abuse   . Anxiety   . Anemia 04/24/2011  . Pneumonia     hospitalized 04/2011  . Shortness of breath     with exertion  . GERD (gastroesophageal reflux disease)   . Constipation   . Eczema   . Kidney stones     Past Surgical History  Procedure Laterality Date  . Hemorrhoid surgery    . Colonoscopy    . Inguinal hernia repair Left 06/06/2012    Procedure: HERNIA REPAIR INGUINAL ADULT;  Surgeon: Shelly Rubenstein, MD;  Location: [facility] OR;  Service: General;  Laterality: Left;  . Insertion of mesh Left 06/06/2012    Procedure: INSERTION OF MESH;  Surgeon: Shelly Rubenstein, MD;  Location: [facility] OR;  Service: General;  Laterality: Left;  . Hernia repair Left 06/06/2012     Allergies  Allergen Reactions  . Oxycodone Nausea And Vomiting    Current Outpatient Prescriptions on File Prior to Visit  Medication Sig Dispense Refill  . albuterol (PROVENTIL HFA;VENTOLIN HFA) 108 (90 BASE) MCG/ACT inhaler Inhale 2 puffs into the lungs every 6 (six) hours as needed. For shortness of breath    . amLODipine (NORVASC) 5 MG tablet Take 5 mg by mouth every morning.    Marland Kitchen ipratropium-albuterol (DUONEB) 0.5-2.5 (3) MG/3ML SOLN Take 3 mLs by nebulization every 6 (six) hours. 360 mL 0  . lisinopril (PRINIVIL,ZESTRIL) 20 MG tablet Take 20 mg by mouth every morning.     . mometasone-formoterol (DULERA) 100-5 MCG/ACT AERO Inhale 2 puffs into the lungs 2 (two) times daily. 1 Inhaler 0  . nicotine (NICODERM CQ - DOSED IN MG/24 HOURS) 21 mg/24hr patch Place 1 patch (21 mg total) onto the skin daily. (Patient not taking: Reported on 10/28/2014) 7 patch 0  . nicotine (NICODERM CQ) 14 mg/24hr patch Place 1 patch (14 mg total) onto the skin daily. (Patient not taking: Reported on 10/28/2014) 7 patch 0  . nicotine (NICODERM CQ) 7 mg/24hr patch Place 1 patch (7 mg total) onto the skin daily. (Patient not taking: Reported  on 10/28/2014) 7 patch 0   No current facility-administered medications on file prior to visit.        Objective:   Physical ExamBlood pressure 158/72, pulse 72, temperature 97.7 F (36.5 C), height 6' (1.829 m), weight 146 lb (66.225 kg). Alert and oriented. Skin warm and dry. Oral mucosa is moist.   . Sclera anicteric, conjunctivae is pink. Thyroid not enlarged. No cervical lymphadenopathy. Bilateral wheezes.  Heart regular rate and rhythm.  Abdomen is soft. Bowel sounds are positive. No hepatomegaly. No abdominal masses felt. No tenderness.  No edema to lower extremities.  Stool brown and guaiac negative.     Developer: [id number] ( 12/2017) Card: Lot [id number] (12/18)     Assessment & Plan:  Constipation: presently taking an OTC laxative. Am going to try him on  Linzess and see how he does. He will call with a PR report. Am going to get a CBC on him today.  COPD severe.

## 2014-10-28 NOTE — Patient Instructions (Addendum)
Samples of Linzess x 4 bxes given to patient and labeled. OV in 1 year.

## 2014-11-08 ENCOUNTER — Encounter (INDEPENDENT_AMBULATORY_CARE_PROVIDER_SITE_OTHER): Payer: Self-pay

## 2015-04-05 ENCOUNTER — Encounter (HOSPITAL_COMMUNITY): Payer: Self-pay | Admitting: Cardiology

## 2015-04-05 ENCOUNTER — Inpatient Hospital Stay (HOSPITAL_COMMUNITY)
Admission: EM | Admit: 2015-04-05 | Discharge: 2015-04-18 | DRG: 190 | Disposition: A | Discharge status: Skilled Nursing Facility | Payer: Medicare Other | Attending: Internal Medicine | Admitting: Internal Medicine

## 2015-04-05 ENCOUNTER — Emergency Department (HOSPITAL_COMMUNITY): Payer: Medicare Other

## 2015-04-05 DIAGNOSIS — Z9119 Patient's noncompliance with other medical treatment and regimen: Secondary | ICD-10-CM

## 2015-04-05 DIAGNOSIS — Z87442 Personal history of urinary calculi: Secondary | ICD-10-CM | POA: Diagnosis not present

## 2015-04-05 DIAGNOSIS — R509 Fever, unspecified: Secondary | ICD-10-CM

## 2015-04-05 DIAGNOSIS — R4182 Altered mental status, unspecified: Secondary | ICD-10-CM

## 2015-04-05 DIAGNOSIS — J189 Pneumonia, unspecified organism: Secondary | ICD-10-CM | POA: Diagnosis present

## 2015-04-05 DIAGNOSIS — K5900 Constipation, unspecified: Secondary | ICD-10-CM | POA: Diagnosis not present

## 2015-04-05 DIAGNOSIS — Z9981 Dependence on supplemental oxygen: Secondary | ICD-10-CM | POA: Diagnosis not present

## 2015-04-05 DIAGNOSIS — Z8249 Family history of ischemic heart disease and other diseases of the circulatory system: Secondary | ICD-10-CM

## 2015-04-05 DIAGNOSIS — J9601 Acute respiratory failure with hypoxia: Secondary | ICD-10-CM | POA: Diagnosis not present

## 2015-04-05 DIAGNOSIS — E43 Unspecified severe protein-calorie malnutrition: Secondary | ICD-10-CM | POA: Diagnosis not present

## 2015-04-05 DIAGNOSIS — F419 Anxiety disorder, unspecified: Secondary | ICD-10-CM | POA: Diagnosis present

## 2015-04-05 DIAGNOSIS — F17210 Nicotine dependence, cigarettes, uncomplicated: Secondary | ICD-10-CM | POA: Diagnosis present

## 2015-04-05 DIAGNOSIS — T380X5A Adverse effect of glucocorticoids and synthetic analogues, initial encounter: Secondary | ICD-10-CM | POA: Diagnosis present

## 2015-04-05 DIAGNOSIS — E872 Acidosis: Secondary | ICD-10-CM | POA: Diagnosis present

## 2015-04-05 DIAGNOSIS — E875 Hyperkalemia: Secondary | ICD-10-CM | POA: Diagnosis not present

## 2015-04-05 DIAGNOSIS — J9622 Acute and chronic respiratory failure with hypercapnia: Secondary | ICD-10-CM | POA: Diagnosis present

## 2015-04-05 DIAGNOSIS — J9602 Acute respiratory failure with hypercapnia: Secondary | ICD-10-CM | POA: Diagnosis not present

## 2015-04-05 DIAGNOSIS — K219 Gastro-esophageal reflux disease without esophagitis: Secondary | ICD-10-CM | POA: Diagnosis present

## 2015-04-05 DIAGNOSIS — J440 Chronic obstructive pulmonary disease with acute lower respiratory infection: Secondary | ICD-10-CM | POA: Diagnosis not present

## 2015-04-05 DIAGNOSIS — F0390 Unspecified dementia without behavioral disturbance: Secondary | ICD-10-CM | POA: Diagnosis present

## 2015-04-05 DIAGNOSIS — Z823 Family history of stroke: Secondary | ICD-10-CM | POA: Diagnosis not present

## 2015-04-05 DIAGNOSIS — R0989 Other specified symptoms and signs involving the circulatory and respiratory systems: Secondary | ICD-10-CM

## 2015-04-05 DIAGNOSIS — G9341 Metabolic encephalopathy: Secondary | ICD-10-CM | POA: Diagnosis present

## 2015-04-05 DIAGNOSIS — F0391 Unspecified dementia with behavioral disturbance: Secondary | ICD-10-CM | POA: Diagnosis present

## 2015-04-05 DIAGNOSIS — R112 Nausea with vomiting, unspecified: Secondary | ICD-10-CM

## 2015-04-05 DIAGNOSIS — J441 Chronic obstructive pulmonary disease with (acute) exacerbation: Secondary | ICD-10-CM | POA: Diagnosis present

## 2015-04-05 DIAGNOSIS — I10 Essential (primary) hypertension: Secondary | ICD-10-CM | POA: Diagnosis present

## 2015-04-05 DIAGNOSIS — R0602 Shortness of breath: Secondary | ICD-10-CM | POA: Diagnosis present

## 2015-04-05 DIAGNOSIS — J9692 Respiratory failure, unspecified with hypercapnia: Secondary | ICD-10-CM | POA: Diagnosis present

## 2015-04-05 DIAGNOSIS — Z79899 Other long term (current) drug therapy: Secondary | ICD-10-CM

## 2015-04-05 DIAGNOSIS — J9690 Respiratory failure, unspecified, unspecified whether with hypoxia or hypercapnia: Secondary | ICD-10-CM

## 2015-04-05 DIAGNOSIS — J9621 Acute and chronic respiratory failure with hypoxia: Secondary | ICD-10-CM | POA: Diagnosis present

## 2015-04-05 DIAGNOSIS — Z720 Tobacco use: Secondary | ICD-10-CM | POA: Diagnosis present

## 2015-04-05 DIAGNOSIS — F03918 Unspecified dementia, unspecified severity, with other behavioral disturbance: Secondary | ICD-10-CM | POA: Diagnosis present

## 2015-04-05 DIAGNOSIS — Z801 Family history of malignant neoplasm of trachea, bronchus and lung: Secondary | ICD-10-CM | POA: Diagnosis not present

## 2015-04-05 HISTORY — DX: Unspecified dementia with behavioral disturbance: F03.91

## 2015-04-05 HISTORY — DX: Acute respiratory failure with hypercapnia: J96.02

## 2015-04-05 HISTORY — DX: Chronic obstructive pulmonary disease with (acute) exacerbation: J44.1

## 2015-04-05 HISTORY — DX: Acute respiratory failure with hypoxia: J96.01

## 2015-04-05 LAB — BLOOD GAS, ARTERIAL
ACID-BASE EXCESS: 7.9 mmol/L — AB (ref 0.0–2.0)
Acid-Base Excess: 8.5 mmol/L — ABNORMAL HIGH (ref 0.0–2.0)
Acid-Base Excess: 7.1 mmol/L — ABNORMAL HIGH (ref 0.0–2.0)
BICARBONATE: 27.4 meq/L — AB (ref 20.0–24.0)
BICARBONATE: 29.6 meq/L — AB (ref 20.0–24.0)
Bicarbonate: 29.2 mEq/L — ABNORMAL HIGH (ref 20.0–24.0)
DELIVERY SYSTEMS: POSITIVE
DRAWN BY: 317771
Delivery systems: POSITIVE
Drawn by: 234301
Drawn by: 21310
EXPIRATORY PAP: 8
EXPIRATORY PAP: 10
FIO2: 40
FIO2: 0.3
INSPIRATORY PAP: 18
Inspiratory PAP: 20
O2 CONTENT: 2 L/min
O2 SAT: 88.8 %
O2 Saturation: 86.9 %
O2 Saturation: 94 %
PATIENT TEMPERATURE: 37
PATIENT TEMPERATURE: 98.1
PCO2 ART: 70.4 mmHg — AB (ref 35.0–45.0)
PH ART: 7.205 — AB (ref 7.350–7.450)
PH ART: 7.307 — AB (ref 7.350–7.450)
PO2 ART: 59.9 mmHg — AB (ref 80.0–100.0)
RATE: 14 resp/min
TCO2: 18.2 mmol/L (ref 0–100)
TCO2: 15.5 mmol/L (ref 0–100)
pCO2 arterial: 83.7 mmHg (ref 35.0–45.0)
pCO2 arterial: 91.4 mmHg (ref 35.0–45.0)
pH, Arterial: 7.251 — ABNORMAL LOW (ref 7.350–7.450)
pO2, Arterial: 82.7 mmHg (ref 80.0–100.0)
pO2, Arterial: 57.8 mmHg — ABNORMAL LOW (ref 80.0–100.0)

## 2015-04-05 LAB — BASIC METABOLIC PANEL
Anion gap: 7 (ref 5–15)
BUN: 55 mg/dL — ABNORMAL HIGH (ref 6–20)
CHLORIDE: 95 mmol/L — AB (ref 101–111)
CO2: 38 mmol/L — AB (ref 22–32)
CREATININE: 1.09 mg/dL (ref 0.61–1.24)
Calcium: 8.7 mg/dL — ABNORMAL LOW (ref 8.9–10.3)
GFR calc non Af Amer: >60 mL/min (ref >60)
GLUCOSE: 109 mg/dL — AB (ref 65–99)
Potassium: 4.4 mmol/L (ref 3.5–5.1)
Sodium: 140 mmol/L (ref 135–145)

## 2015-04-05 LAB — CBC WITH DIFFERENTIAL/PLATELET
BASOS PCT: 0 %
Basophils Absolute: 0 10*3/uL (ref 0.0–0.1)
Eosinophils Absolute: 0 10*3/uL (ref 0.0–0.7)
Eosinophils Relative: 0 %
HEMATOCRIT: 42.8 % (ref 39.0–52.0)
HEMOGLOBIN: 13.5 g/dL (ref 13.0–17.0)
LYMPHS ABS: 0.7 10*3/uL (ref 0.7–4.0)
LYMPHS PCT: 11 %
MCH: 31.1 pg (ref 26.0–34.0)
MCHC: 31.5 g/dL (ref 30.0–36.0)
MCV: 98.6 fL (ref 78.0–100.0)
MONOS PCT: 10 %
Monocytes Absolute: 0.7 10*3/uL (ref 0.1–1.0)
NEUTROS ABS: 5.6 10*3/uL (ref 1.7–7.7)
NEUTROS PCT: 79 %
Platelets: 131 10*3/uL — ABNORMAL LOW (ref 150–400)
RBC: 4.34 MIL/uL (ref 4.22–5.81)
RDW: 13.3 % (ref 11.5–15.5)
WBC: 7 10*3/uL (ref 4.0–10.5)

## 2015-04-05 LAB — TROPONIN I: Troponin I: <0.03 ng/mL (ref <0.031)

## 2015-04-05 LAB — BRAIN NATRIURETIC PEPTIDE: B NATRIURETIC PEPTIDE 5: 170 pg/mL — AB (ref 0.0–100.0)

## 2015-04-05 MED ORDER — ENOXAPARIN SODIUM 30 MG/0.3ML ~~LOC~~ SOLN: 30.0000 mg | SUBCUTANEOUS | Status: DC

## 2015-04-05 MED ORDER — ENOXAPARIN SODIUM 40 MG/0.4ML ~~LOC~~ SOLN
40.0000 mg | SUBCUTANEOUS | Status: DC
  Administered 2015-04-06 – 2015-04-17 (×13): 40 mg via SUBCUTANEOUS
  Filled 2015-04-05 (×13): qty 0.4

## 2015-04-05 MED ORDER — SODIUM CHLORIDE 0.9 % IV SOLN
INTRAVENOUS | Status: AC
  Administered 2015-04-05: 22:00:00 via INTRAVENOUS

## 2015-04-05 MED ORDER — IPRATROPIUM-ALBUTEROL 0.5-2.5 (3) MG/3ML IN SOLN
3.0000 mL | RESPIRATORY_TRACT | Status: DC
  Administered 2015-04-05 – 2015-04-18 (×69): 3 mL via RESPIRATORY_TRACT
  Filled 2015-04-05 (×69): qty 3

## 2015-04-05 MED ORDER — ACETAMINOPHEN 325 MG PO TABS: 650.0000 mg | ORAL_TABLET | Freq: Four times a day (QID) | ORAL | Status: DC | PRN

## 2015-04-05 MED ORDER — ONDANSETRON HCL 4 MG/2ML IJ SOLN
4.0000 mg | Freq: Four times a day (QID) | INTRAMUSCULAR | Status: DC | PRN
  Administered 2015-04-09 – 2015-04-16 (×3): 4 mg via INTRAVENOUS
  Filled 2015-04-05 (×3): qty 2

## 2015-04-05 MED ORDER — LEVOFLOXACIN IN D5W 750 MG/150ML IV SOLN
750.0000 mg | INTRAVENOUS | Status: DC
  Administered 2015-04-06 – 2015-04-11 (×6): 750 mg via INTRAVENOUS
  Filled 2015-04-05 (×6): qty 150

## 2015-04-05 MED ORDER — MAGNESIUM SULFATE 2 GM/50ML IV SOLN
2.0000 g | Freq: Once | INTRAVENOUS | Status: AC
  Administered 2015-04-05: 2 g via INTRAVENOUS
  Filled 2015-04-05: qty 50

## 2015-04-05 MED ORDER — ENOXAPARIN SODIUM 80 MG/0.8ML ~~LOC~~ SOLN
1.0000 mg/kg | Freq: Two times a day (BID) | SUBCUTANEOUS | Status: DC
  Filled 2015-04-05: qty 0.8

## 2015-04-05 MED ORDER — ONDANSETRON HCL 4 MG PO TABS
4.0000 mg | ORAL_TABLET | Freq: Four times a day (QID) | ORAL | Status: DC | PRN
  Administered 2015-04-07 – 2015-04-16 (×2): 4 mg via ORAL
  Filled 2015-04-05 (×2): qty 1

## 2015-04-05 MED ORDER — ACETAMINOPHEN 650 MG RE SUPP: 650.0000 mg | Freq: Four times a day (QID) | RECTAL | Status: DC | PRN

## 2015-04-05 MED ORDER — METHYLPREDNISOLONE SODIUM SUCC 125 MG IJ SOLR
125.0000 mg | Freq: Once | INTRAMUSCULAR | Status: AC
  Administered 2015-04-05: 125 mg via INTRAVENOUS
  Filled 2015-04-05: qty 2

## 2015-04-05 MED ORDER — SODIUM CHLORIDE 0.9 % IV SOLN: 250.0000 mL | INTRAVENOUS | Status: DC | PRN

## 2015-04-05 MED ORDER — LEVOFLOXACIN IN D5W 500 MG/100ML IV SOLN
500.0000 mg | Freq: Once | INTRAVENOUS | Status: AC
  Administered 2015-04-05: 500 mg via INTRAVENOUS
  Filled 2015-04-05: qty 100

## 2015-04-05 MED ORDER — SODIUM CHLORIDE 0.9% FLUSH
3.0000 mL | Freq: Two times a day (BID) | INTRAVENOUS | Status: DC
  Administered 2015-04-05 – 2015-04-18 (×19): 3 mL via INTRAVENOUS

## 2015-04-05 MED ORDER — SODIUM CHLORIDE 0.9% FLUSH: 3.0000 mL | INTRAVENOUS | Status: DC | PRN

## 2015-04-05 MED ORDER — IPRATROPIUM BROMIDE 0.02 % IN SOLN
0.5000 mg | Freq: Once | RESPIRATORY_TRACT | Status: AC
  Administered 2015-04-05: 0.5 mg via RESPIRATORY_TRACT
  Filled 2015-04-05: qty 2.5

## 2015-04-05 MED ORDER — HYDROMORPHONE HCL 1 MG/ML IJ SOLN: 0.5000 mg | INTRAMUSCULAR | Status: DC | PRN

## 2015-04-05 MED ORDER — ALBUTEROL (5 MG/ML) CONTINUOUS INHALATION SOLN
10.0000 mg/h | INHALATION_SOLUTION | Freq: Once | RESPIRATORY_TRACT | Status: AC
  Administered 2015-04-05: 10 mg/h via RESPIRATORY_TRACT
  Filled 2015-04-05: qty 20

## 2015-04-05 MED ORDER — ALUM & MAG HYDROXIDE-SIMETH 200-200-20 MG/5ML PO SUSP
30.0000 mL | Freq: Four times a day (QID) | ORAL | Status: DC | PRN
  Administered 2015-04-08 – 2015-04-09 (×2): 30 mL via ORAL
  Filled 2015-04-05 (×2): qty 30

## 2015-04-05 NOTE — H&P (Addendum)
[facility] Admission History and Physical       Shane Todd:[id number] DOB: 10-11-1945 DOA: 04/05/2015  Referring physician:  EDP PCP: [name], MD  Specialists:   Chief Complaint: SOB  HPI: Shane Todd is a 70 y.o. male with a history of COPD on 2 liters of NCO2 at home who was brought to the ED due to increased SOB , and confusion then with increased somnolence over the past 2 days.   He was evaluated in the ED and was found to have an ABG with a PH of 7.2, and an initial pCO2 of 83 and he was placed on BiPAP.    He was also found to have a LLL pneumonia and was placed on IV Levaquin and referred for admission.   The history has been obtained form his wife who is at the bedside.       Review of Systems: Unable to Obtain from the Patient   Past Medical History  Diagnosis Date  . COPD (chronic obstructive pulmonary disease) (HCC)   . Hypertension   . Tobacco abuse   . Anxiety   . Anemia 04/24/2011  . Pneumonia     hospitalized 04/2011  . Shortness of breath     with exertion  . GERD (gastroesophageal reflux disease)   . Constipation   . Eczema   . Kidney stones      Past Surgical History  Procedure Laterality Date  . Hemorrhoid surgery    . Colonoscopy    . Inguinal hernia repair Left 06/06/2012    Procedure: HERNIA REPAIR INGUINAL ADULT;  Surgeon: Shelly Rubenstein, MD;  Location: [facility] OR;  Service: General;  Laterality: Left;  . Insertion of mesh Left 06/06/2012    Procedure: INSERTION OF MESH;  Surgeon: Shelly Rubenstein, MD;  Location: [facility] OR;  Service: General;  Laterality: Left;  . Hernia repair Left 06/06/2012      Prior to Admission medications   Medication Sig Start Date End Date Taking? Authorizing Provider  albuterol (PROVENTIL HFA;VENTOLIN HFA) 108 (90 BASE) MCG/ACT inhaler Inhale 2 puffs into the lungs every 6 (six) hours as needed. For shortness of breath   Yes Historical Provider, MD  amLODipine (NORVASC) 5 MG tablet  Take 5 mg by mouth every morning.   Yes Historical Provider, MD  lisinopril (PRINIVIL,ZESTRIL) 20 MG tablet Take 20 mg by mouth every morning.    Yes Historical Provider, MD  mometasone-formoterol (DULERA) 100-5 MCG/ACT AERO Inhale 2 puffs into the lungs 2 (two) times daily. 07/05/13  Yes Calvert Cantor, MD  traZODone (DESYREL) 100 MG tablet Take 100 mg by mouth at bedtime. 03/22/15  Yes Historical Provider, MD     Allergies  Allergen Reactions  . Oxycodone Nausea And Vomiting    Social History:  reports that he has been smoking.  He does not have any smokeless tobacco history on file. He reports that he does not drink alcohol or use illicit drugs.    Family History  Problem Relation Age of Onset  . Stroke Mother   . Deep vein thrombosis Father   . Lung cancer Sister   . Stroke Sister   . Hypertension Sister   . Heart disease Brother   . Stroke Brother   . Stroke Sister   . Heart disease Sister        Physical Exam:  GEN:  Obtunded Thin Well Developed  70 y.o. Caucasian male examined and in no acute distress;  Filed Vitals:  04/05/15 1721 04/05/15 1730 04/05/15 1800 04/05/15 1810  BP:  147/63 144/64   Pulse:  99 108 112  Temp:      TempSrc:      Resp:  32 29 26  Height:      Weight:      SpO2: 88% 100% 100% 98%   Blood pressure 144/64, pulse 112, temperature 98 F (36.7 C), temperature source Oral, resp. rate 26, height  (1.854 m), weight 68.04 kg (150 lb), SpO2 98 %. PSYCH: He is alert and oriented x1;  Obtunded HEENT: Normocephalic and Atraumatic, Mucous membranes pink; PERRLA; EOM intact; Fundi:  Benign;  No scleral icterus, Nares: Patent, Oropharynx: Clear, Fair Dentition,    Neck:  FROM, No Cervical Lymphadenopathy nor Thyromegaly or Carotid Bruit; No JVD; Breasts:: Not examined CHEST WALL: No tenderness CHEST: Normal respiration, clear to auscultation bilaterally HEART: Regular rate and rhythm; no murmurs rubs or gallops BACK: No kyphosis or scoliosis; No CVA  tenderness ABDOMEN: Positive Bowel Sounds, Scaphoid, Soft Non-Tender, No Rebound or Guarding; No Masses, No Organomegaly Rectal Exam: Not done EXTREMITIES: No Cyanosis, Clubbing, or Edema; No Ulcerations. Genitalia: not examined PULSES: 2+ and symmetric SKIN: Normal hydration no rash or ulceration CNS:  Alert and Oriented x 1 ( Obtunded), No Focal Deficits Vascular: pulses palpable throughout    Labs on Admission:  Basic Metabolic Panel:  Recent Labs Lab 04/05/15 1721  NA 140  K 4.4  CL 95*  CO2 38*  GLUCOSE 109*  BUN 55*  CREATININE 1.09  CALCIUM 8.7*   Liver Function Tests: No results for input(s): AST, ALT, ALKPHOS, BILITOT, PROT, ALBUMIN in the last 168 hours. No results for input(s): LIPASE, AMYLASE in the last 168 hours. No results for input(s): AMMONIA in the last 168 hours. CBC:  Recent Labs Lab 04/05/15 1721  WBC 7.0  NEUTROABS 5.6  HGB 13.5  HCT 42.8  MCV 98.6  PLT 131*   Cardiac Enzymes:  Recent Labs Lab 04/05/15 1721  TROPONINI <0.03    BNP (last 3 results) No results for input(s): BNP in the last 8760 hours.  ProBNP (last 3 results) No results for input(s): PROBNP in the last 8760 hours.  CBG: No results for input(s): GLUCAP in the last 168 hours.  Radiological Exams on Admission: Dg Chest Portable 1 View  04/05/2015  CLINICAL DATA:  Short of breath for 1 day EXAM: PORTABLE CHEST 1 VIEW COMPARISON:  07/02/2013 FINDINGS: Diffuse vascular congestion. Upper normal heart size. Kerley B-lines at the lung bases. Hyperaeration. No pneumothorax. More confluent opacity at the left base may represent airspace edema. IMPRESSION: Findings above for worrisome for mild interstitial edema and vascular congestion. Airspace disease at the left base is nonspecific and may represent focal edema or an inflammatory process. Electronically Signed   By: Jolaine Click M.D.   On: 04/05/2015 17:42     EKG: Independently reviewed. Normal Sinus Rhythm rate =  99   Assessment/Plan:    70 y.o. male with  Active Problems:    1.    Acute respiratory failure with hypoxia and hypercarbia (HCC)    BIPAP/O2    Monitor ABGs, and O2 sats    NPO while on BiPAP      2.    COPD exacerbation (HCC)    DUONebs    Steroid Taper      3.    CAP    IV Levaquin daily      3.    Essential Hypertension  Monitor BPs      4.    DVT Prophylaxis    Lovenox    Code Status:     FULL CODE       Family Communication:   Wife at Bedside  Disposition Plan:    Inpatient Status        Time spent:  40 Minutes      Ron Parker [facility] Pager [phone]   If 7AM -7PM Please Contact the Day Rounding Team MD for [facility]  If 7PM-7AM, Please Contact Night-Floor Coverage  [email or web address] Password [facility] 04/05/2015, 6:49 PM     ADDENDUM:   Patient was seen and examined on 04/05/2015

## 2015-04-05 NOTE — ED Provider Notes (Signed)
CSN: [id number]     Arrival date & time 04/05/15  1652 History   First MD Initiated Contact with Patient 04/05/15 1701     Chief Complaint  Patient presents with  . Shortness of Breath     (Consider location/radiation/quality/duration/timing/severity/associated sxs/prior Treatment) HPI Comments: Level 5 caveat for respiratory distress. Hx COPD on home O2 with CO2 retention in the past. Presenting with SOB and cough since last night.  Nebs given by EMS.  Denies chest pain, cough, fever.  Continues to smoke.  No leg pain or leg swelling.  No abdominal pain or vomiting.  Used nebulizers at home but unable to state how many.  Patient is a 70 y.o. male presenting with shortness of breath. The history is provided by the patient and the EMS personnel. The history is limited by the condition of the patient.  Shortness of Breath   Past Medical History  Diagnosis Date  . COPD (chronic obstructive pulmonary disease) (HCC)   . Hypertension   . Tobacco abuse   . Anxiety   . Anemia 04/24/2011  . Pneumonia     hospitalized 04/2011  . Shortness of breath     with exertion  . GERD (gastroesophageal reflux disease)   . Constipation   . Eczema   . Kidney stones    Past Surgical History  Procedure Laterality Date  . Hemorrhoid surgery    . Colonoscopy    . Inguinal hernia repair Left 06/06/2012    Procedure: HERNIA REPAIR INGUINAL ADULT;  Surgeon: Shelly Rubenstein, MD;  Location: [facility] OR;  Service: General;  Laterality: Left;  . Insertion of mesh Left 06/06/2012    Procedure: INSERTION OF MESH;  Surgeon: Shelly Rubenstein, MD;  Location: [facility] OR;  Service: General;  Laterality: Left;  . Hernia repair Left 06/06/2012   Family History  Problem Relation Age of Onset  . Stroke Mother   . Deep vein thrombosis Father   . Lung cancer Sister   . Stroke Sister   . Hypertension Sister   . Heart disease Brother   . Stroke Brother   . Stroke Sister   . Heart disease Sister    Social History   Substance Use Topics  . Smoking status: Current Every Day Smoker -- 0.50 packs/day    Last Attempt to Quit: 05/22/2011  . Smokeless tobacco: None     Comment: 1 pack every 2 days x 44 yrs.   . Alcohol Use: No    Review of Systems  Unable to perform ROS: Acuity of condition  Respiratory: Positive for shortness of breath.       Allergies  Oxycodone  Home Medications   Prior to Admission medications   Medication Sig Start Date End Date Taking? Authorizing Provider  albuterol (PROVENTIL HFA;VENTOLIN HFA) 108 (90 BASE) MCG/ACT inhaler Inhale 2 puffs into the lungs every 4 (four) hours as needed for wheezing or shortness of breath. For shortness of breath   Yes Historical Provider, MD  amLODipine (NORVASC) 5 MG tablet Take 5 mg by mouth every morning.   Yes Historical Provider, MD  fluticasone furoate-vilanterol (BREO ELLIPTA) 100-25 MCG/INH AEPB Inhale 1 puff into the lungs daily.   Yes Historical Provider, MD  lisinopril (PRINIVIL,ZESTRIL) 20 MG tablet Take 20 mg by mouth every morning.    Yes Historical Provider, MD  mometasone-formoterol (DULERA) 100-5 MCG/ACT AERO Inhale 2 puffs into the lungs 2 (two) times daily. Patient taking differently: Inhale 1 puff into the lungs 2 (two) times daily.  07/05/13  Yes Calvert Cantor, MD  traZODone (DESYREL) 100 MG tablet Take 100 mg by mouth at bedtime. 03/22/15  Yes Historical Provider, MD  umeclidinium-vilanterol (ANORO ELLIPTA) 62.5-25 MCG/INH AEPB Inhale 1 puff into the lungs 2 (two) times daily.   Yes Historical Provider, MD   BP 133/68 mmHg  Pulse 102  Temp(Src) 98.1 F (36.7 C) (Rectal)  Resp 23  Ht  (1.854 m)  Wt 150 lb (68.04 kg)  BMI 19.79 kg/m2  SpO2 95% Physical Exam  Constitutional: He is oriented to person, place, and time. He appears well-developed and well-nourished. He appears distressed.  Mildly confused   HENT:  Head: Normocephalic and atraumatic.  Mouth/Throat: Oropharynx is clear and moist. No oropharyngeal  exudate.  Eyes: Conjunctivae and EOM are normal. Pupils are equal, round, and reactive to light.  Neck: Normal range of motion. Neck supple.  No meningismus.  Cardiovascular: Normal rate, regular rhythm, normal heart sounds and intact distal pulses.   No murmur heard. tachycardic  Pulmonary/Chest: He is in respiratory distress. He has wheezes.  Decreased breath sounds with scattered inspiratory and expiratory wheezing.  Abdominal: Soft. There is no tenderness. There is no rebound and no guarding.  Musculoskeletal: Normal range of motion. He exhibits no edema or tenderness.  Neurological: He is alert and oriented to person, place, and time. No cranial nerve deficit. He exhibits normal muscle tone. Coordination normal.  No ataxia on finger to nose bilaterally. No pronator drift. 5/5 strength throughout. CN 2-12 intact.Equal grip strength. Sensation intact.   Skin: Skin is warm.  Psychiatric: He has a normal mood and affect. His behavior is normal.  Nursing note and vitals reviewed.   ED Course  Procedures (including critical care time) Labs Review Labs Reviewed  CBC WITH DIFFERENTIAL/PLATELET - Abnormal; Notable for the following:    Platelets 131 (*)    All other components within normal limits  BASIC METABOLIC PANEL - Abnormal; Notable for the following:    Chloride 95 (*)    CO2 38 (*)    Glucose, Bld 109 (*)    BUN 55 (*)    Calcium 8.7 (*)    All other components within normal limits  BLOOD GAS, ARTERIAL - Abnormal; Notable for the following:    pH, Arterial 7.251 (*)    pCO2 arterial 83.7 (*)    pO2, Arterial 59.9 (*)    Bicarbonate 29.2 (*)    Acid-Base Excess 8.5 (*)    All other components within normal limits  BRAIN NATRIURETIC PEPTIDE - Abnormal; Notable for the following:    B Natriuretic Peptide 170.0 (*)    All other components within normal limits  BLOOD GAS, ARTERIAL - Abnormal; Notable for the following:    pH, Arterial 7.205 (*)    pCO2 arterial 91.4 (*)     Bicarbonate 27.4 (*)    Acid-Base Excess 7.1 (*)    All other components within normal limits  BLOOD GAS, ARTERIAL - Abnormal; Notable for the following:    pH, Arterial 7.307 (*)    pCO2 arterial 70.4 (*)    pO2, Arterial 57.8 (*)    Bicarbonate 29.6 (*)    Acid-Base Excess 7.9 (*)    All other components within normal limits  MRSA PCR SCREENING  TROPONIN I  BASIC METABOLIC PANEL  CBC    Imaging Review Dg Chest Portable 1 View  04/05/2015  CLINICAL DATA:  Short of breath for 1 day EXAM: PORTABLE CHEST 1 VIEW COMPARISON:  07/02/2013 FINDINGS:  Diffuse vascular congestion. Upper normal heart size. Kerley B-lines at the lung bases. Hyperaeration. No pneumothorax. More confluent opacity at the left base may represent airspace edema. IMPRESSION: Findings above for worrisome for mild interstitial edema and vascular congestion. Airspace disease at the left base is nonspecific and may represent focal edema or an inflammatory process. Electronically Signed   By: Jolaine Click M.D.   On: 04/05/2015 17:42   I have personally reviewed and evaluated these images and lab results as part of my medical decision-making.   EKG Interpretation   Date/Time:  Tuesday April 05 2015 17:00:59 EST Ventricular Rate:  99 PR Interval:  141 QRS Duration: 108 QT Interval:  356 QTC Calculation: 457 R Axis:   84 Text Interpretation:  Sinus rhythm Biatrial enlargement Borderline right  axis deviation RSR' in V1 or V2, probably normal variant Rate faster  Confirmed by Afnan Emberton  MD, Tonea Leiphart [phone]) on 04/05/2015 5:07:02 PM      MDM   Final diagnoses:  Acute respiratory failure with hypercapnia (HCC)  COPD exacerbation (HCC)   Respiratory distress with history of COPD.   Nebs, steroids. CXR.  Patient given continuous nebulizer, steroids, magnesium. ABG is obtained and shows pH 7.25 and PCO2 of 84. BiPAP was begun.  Chest x-ray shows nonspecific left basilar airspace disease. We'll treat with  Levaquin.  Patient feels improved on BiPAP. he is awake and alert and oriented. He is answering questions appropriately. He continues to wheeze. Additional nebulizers will be given. Admission to stepdown unit discussed with Dr. Lovell Sheehan.  CRITICAL CARE Performed by: Glynn Octave Total critical care time: 40 minutes Critical care time was exclusive of separately billable procedures and treating other patients. Critical care was necessary to treat or prevent imminent or life-threatening deterioration. Critical care was time spent personally by me on the following activities: development of treatment plan with patient and/or surrogate as well as nursing, discussions with consultants, evaluation of patient's response to treatment, examination of patient, obtaining history from patient or surrogate, ordering and performing treatments and interventions, ordering and review of laboratory studies, ordering and review of radiographic studies, pulse oximetry and re-evaluation of patient's condition.    Glynn Octave, MD 04/06/15 [phone]

## 2015-04-05 NOTE — ED Notes (Signed)
CRITICAL VALUE ALERT  Critical value received: pH 7.25, pCO2 83.7, pO2 59.9, HCO3 29.2  Date of notification:  04/05/15  Time of notification:  1733  Critical value read back:Yes.    Nurse who received alert:  Berdine Dance RN  MD notified (1st page):  Rancour  Time of first page:  1734  MD notified (2nd page):  Time of second page:  Responding MD:  Rancour  Time MD responded:  [phone]

## 2015-04-05 NOTE — ED Notes (Signed)
Sob since yesterday.  Today altered mental status.  History of same and CO2 was 125-150.  EMS gave 3 albuterals .

## 2015-04-06 LAB — BASIC METABOLIC PANEL
Anion gap: 9 (ref 5–15)
BUN: 55 mg/dL — ABNORMAL HIGH (ref 6–20)
CALCIUM: 8.6 mg/dL — AB (ref 8.9–10.3)
CHLORIDE: 98 mmol/L — AB (ref 101–111)
CO2: 34 mmol/L — ABNORMAL HIGH (ref 22–32)
Creatinine, Ser: 0.94 mg/dL (ref 0.61–1.24)
GLUCOSE: 165 mg/dL — AB (ref 65–99)
POTASSIUM: 4 mmol/L (ref 3.5–5.1)
SODIUM: 141 mmol/L (ref 135–145)

## 2015-04-06 LAB — BLOOD GAS, ARTERIAL
Acid-Base Excess: 7.8 mmol/L — ABNORMAL HIGH (ref 0.0–2.0)
Bicarbonate: 29.6 mEq/L — ABNORMAL HIGH (ref 20.0–24.0)
DELIVERY SYSTEMS: POSITIVE
Drawn by: 317771
EXPIRATORY PAP: 12
FIO2: 0.35
INSPIRATORY PAP: 24
LHR: 14 {breaths}/min
O2 Saturation: 93.1 %
PH ART: 7.327 — AB (ref 7.350–7.450)
TCO2: 17.4 mmol/L (ref 0–100)
pCO2 arterial: 66.2 mmHg (ref 35.0–45.0)
pO2, Arterial: 69.1 mmHg — ABNORMAL LOW (ref 80.0–100.0)

## 2015-04-06 LAB — CBC
HCT: 39.8 % (ref 39.0–52.0)
HEMOGLOBIN: 12.2 g/dL — AB (ref 13.0–17.0)
MCH: 30 pg (ref 26.0–34.0)
MCHC: 30.7 g/dL (ref 30.0–36.0)
MCV: 98 fL (ref 78.0–100.0)
Platelets: 144 10*3/uL — ABNORMAL LOW (ref 150–400)
RBC: 4.06 MIL/uL — ABNORMAL LOW (ref 4.22–5.81)
RDW: 13.4 % (ref 11.5–15.5)
WBC: 5.3 10*3/uL (ref 4.0–10.5)

## 2015-04-06 LAB — GLUCOSE, CAPILLARY: GLUCOSE-CAPILLARY: 124 mg/dL — AB (ref 65–99)

## 2015-04-06 LAB — MRSA PCR SCREENING: MRSA by PCR: NEGATIVE

## 2015-04-06 MED ORDER — ALBUTEROL (5 MG/ML) CONTINUOUS INHALATION SOLN
10.0000 mg/h | INHALATION_SOLUTION | Freq: Once | RESPIRATORY_TRACT | Status: AC
  Administered 2015-04-06: 10 mg/h via RESPIRATORY_TRACT

## 2015-04-06 MED ORDER — GUAIFENESIN ER 600 MG PO TB12
1200.0000 mg | ORAL_TABLET | Freq: Two times a day (BID) | ORAL | Status: DC
  Administered 2015-04-06 – 2015-04-18 (×18): 1200 mg via ORAL
  Filled 2015-04-06 (×20): qty 2

## 2015-04-06 MED ORDER — SODIUM CHLORIDE 0.9 % IV SOLN
INTRAVENOUS | Status: DC
  Administered 2015-04-08 – 2015-04-09 (×3): 1000 mL via INTRAVENOUS
  Administered 2015-04-09: 23:00:00 via INTRAVENOUS
  Administered 2015-04-10: 1000 mL via INTRAVENOUS
  Administered 2015-04-11 – 2015-04-12 (×4): via INTRAVENOUS
  Administered 2015-04-13: 1000 mL via INTRAVENOUS
  Administered 2015-04-14 – 2015-04-15 (×3): via INTRAVENOUS

## 2015-04-06 MED ORDER — METHYLPREDNISOLONE SODIUM SUCC 125 MG IJ SOLR
80.0000 mg | Freq: Four times a day (QID) | INTRAMUSCULAR | Status: DC
  Administered 2015-04-06 – 2015-04-10 (×17): 80 mg via INTRAVENOUS
  Filled 2015-04-06 (×17): qty 2

## 2015-04-06 MED ORDER — ALBUTEROL (5 MG/ML) CONTINUOUS INHALATION SOLN
INHALATION_SOLUTION | RESPIRATORY_TRACT | Status: AC
  Administered 2015-04-06: 10 mg/h via RESPIRATORY_TRACT
  Filled 2015-04-06: qty 20

## 2015-04-06 MED ORDER — IPRATROPIUM-ALBUTEROL 0.5-2.5 (3) MG/3ML IN SOLN
3.0000 mL | Freq: Once | RESPIRATORY_TRACT | Status: AC
  Administered 2015-04-06: 3 mL via RESPIRATORY_TRACT
  Filled 2015-04-06: qty 3

## 2015-04-06 MED ORDER — MORPHINE SULFATE (PF) 2 MG/ML IV SOLN
2.0000 mg | INTRAVENOUS | Status: DC | PRN
  Administered 2015-04-07 – 2015-04-10 (×7): 2 mg via INTRAVENOUS
  Filled 2015-04-06 (×7): qty 1

## 2015-04-06 MED ORDER — MORPHINE SULFATE (PF) 2 MG/ML IV SOLN
2.0000 mg | Freq: Once | INTRAVENOUS | Status: AC
  Administered 2015-04-06: 2 mg via INTRAVENOUS
  Filled 2015-04-06: qty 1

## 2015-04-06 NOTE — Progress Notes (Signed)
[facility] PROGRESS NOTE  Shane Todd RUE:[id number] DOB: 30-Aug-1945 DOA: 04/05/2015 PCP: [name], MD  Assessment/Plan: 1. Acute and chronic respiratory failure with hypoxia and hypercarbia. Improving. Initially required BiPAP/O2, but has since been weaned down to a nasal cannula. He normally uses 2.5L O2 at home. Will continue to wean down O2 as tolerated. Continue to monitor ABGs and O2 sats.  2. COPD exacerbation (HCC). He continues to feel short of breath. Continue DUONebs, steroids, abx, and pulmonary hygiene. Appreciate pulmonology assistance.  3. CAP, WBC wnl and afebrile. Continue IV Levaquin daily 4. Essential HTN, stable. Continue home meds   Code Status: Full DVT prophylaxis: Lovenox Family Communication: No family at bedside Disposition Plan: Anticipate discharge once improved   Consultants:  Pulmonology  Procedures:  none  Antibiotics:  Levquin 2/21 >>  HPI/Subjective: Breathing is doing well right now but is not yet feeling 100%. Denies wheezing or coughing. Breathing effort has gotten easier. Normally wears 2.5L of O2 at home PRN and can walk around without a cane or walker. Does not normally wear any BiPAP mask when he sleeps.   Objective: Filed Vitals:   04/06/15 0500 04/06/15 0600  BP: 98/62 126/77  Pulse: 118 111  Temp:    Resp: 22 13    Intake/Output Summary (Last 24 hours) at 04/06/15 0737 Last data filed at 04/06/15 0600  Gross per 24 hour  Intake 343.75 ml  Output    425 ml  Net -81.25 ml   Filed Weights   04/05/15 1656  Weight: 68.04 kg (150 lb)    Exam:  General: NAD, looks comfortable Cardiovascular: tachycardic Respiratory:  increased respiratory effort, no wheezing, fair air movement bilaterally Abdomen: soft, non tender, no distention , bowel sounds normal Musculoskeletal: No edema b/l   Data Reviewed: Basic Metabolic Panel:  Recent Labs Lab 04/05/15 1721 04/06/15 0413  NA 140 141  K 4.4 4.0   CL 95* 98*  CO2 38* 34*  GLUCOSE 109* 165*  BUN 55* 55*  CREATININE 1.09 0.94  CALCIUM 8.7* 8.6*   CBC:  Recent Labs Lab 04/05/15 1721 04/06/15 0413  WBC 7.0 5.3  NEUTROABS 5.6  --   HGB 13.5 12.2*  HCT 42.8 39.8  MCV 98.6 98.0  PLT 131* 144*   Cardiac Enzymes:  Recent Labs Lab 04/05/15 1721  TROPONINI <0.03   BNP (last 3 results)  Recent Labs  04/05/15 1721  BNP 170.0*     Recent Results (from the past 240 hour(s))  MRSA PCR Screening     Status: None   Collection Time: 04/05/15 10:20 PM  Result Value Ref Range Status   MRSA by PCR NEGATIVE NEGATIVE Final    Comment:        The GeneXpert MRSA Assay (FDA approved for NASAL specimens only), is one component of a comprehensive MRSA colonization surveillance program. It is not intended to diagnose MRSA infection nor to guide or monitor treatment for MRSA infections.      Studies: Dg Chest Portable 1 View  04/05/2015  CLINICAL DATA:  Short of breath for 1 day EXAM: PORTABLE CHEST 1 VIEW COMPARISON:  07/02/2013 FINDINGS: Diffuse vascular congestion. Upper normal heart size. Kerley B-lines at the lung bases. Hyperaeration. No pneumothorax. More confluent opacity at the left base may represent airspace edema. IMPRESSION: Findings above for worrisome for mild interstitial edema and vascular congestion. Airspace disease at the left base is nonspecific and may represent focal edema or an inflammatory process. Electronically Signed  By: Jolaine Click M.D.   On: 04/05/2015 17:42    Scheduled Meds: . sodium chloride   Intravenous STAT  . enoxaparin (LOVENOX) injection  40 mg Subcutaneous Q24H  . ipratropium-albuterol  3 mL Nebulization Q4H  . levofloxacin (LEVAQUIN) IV  750 mg Intravenous Q24H  . methylPREDNISolone (SOLU-MEDROL) injection  80 mg Intravenous Q6H  . sodium chloride flush  3 mL Intravenous Q12H   Continuous Infusions:   Active Problems:   Acute respiratory failure with hypoxia and hypercarbia  (HCC)   Hypercapnic respiratory failure (HCC)   COPD exacerbation (HCC)   Time spent: 25 minutes  Erick Blinks, MD. [facility] Pager [phone]. If 7PM-7AM, please contact night-coverage at [email or web address], password [facility] 04/06/2015, 7:37 AM  LOS: 1 day     By signing my name below, I, Adron Bene, attest that this documentation has been prepared under the direction and in the presence of Erick Blinks, MD. Electronically Signed: Adron Bene  04/06/2015 11:08am  I, Dr. Erick Blinks, personally performed the services described in this documentaiton. All medical record entries made by the scribe were at my direction and in my presence. I have reviewed the chart and agree that the record reflects my personal performance and is accurate and complete  Erick Blinks, MD, 04/06/2015 11:33 AM

## 2015-04-06 NOTE — Progress Notes (Signed)
Report received. -care of patient assumed by this RN at this time Patient noted to be on the commode. -this RN introduced himself to the patient identifying himself as his current nurse -patient reported being out of breath at this time (SaO2 monitor reconnected to the patient and the SaO2 was 87%) -patient's O2 restarted at 3L (patietn tolerated well SaO2 increased steadily and is stable at 96%) Patient aided to the bed and has no complaints at this time.

## 2015-04-06 NOTE — Consult Note (Signed)
Consult requested by: [facility] Consult requested for respiratory failure/COPD exacerbation:  HPI: This is a 70 year old who came to the emergency room last night with a 2 day history of increasing shortness of breath and then he became somnolent. When he came to the emergency department he was found to have acute on chronic respiratory failure with a pH of 7.2. He was very sleepy on admission. He was placed on BiPAP and admitted to the intensive care unit and has had increasing problems through the night. At one point in the night he was close to requiring intubation and mechanical ventilation but improved with morphine and nebulizer treatment. This morning although he is still obviously very short of breath he is able to answer questions. He is on BiPAP. His respiratory rates about 20-25. Oxygenation is okay in the 90s. He says he is not coughing. He has not coughed up any sputum. He's not had any chest pain.  Past Medical History  Diagnosis Date  . COPD (chronic obstructive pulmonary disease) (HCC)   . Hypertension   . Tobacco abuse   . Anxiety   . Anemia 04/24/2011  . Pneumonia     hospitalized 04/2011  . Shortness of breath     with exertion  . GERD (gastroesophageal reflux disease)   . Constipation   . Eczema   . Kidney stones      Family History  Problem Relation Age of Onset  . Stroke Mother   . Deep vein thrombosis Father   . Lung cancer Sister   . Stroke Sister   . Hypertension Sister   . Heart disease Brother   . Stroke Brother   . Stroke Sister   . Heart disease Sister      Social History   Social History  . Marital Status: Married    Spouse Name: N/A  . Number of Children: N/A  . Years of Education: N/A   Social History Main Topics  . Smoking status: Current Every Day Smoker -- 0.50 packs/day    Last Attempt to Quit: 05/22/2011  . Smokeless tobacco: None     Comment: 1 pack every 2 days x 44 yrs.   . Alcohol Use: No  . Drug Use: No  . Sexual  Activity: Yes   Other Topics Concern  . None   Social History Narrative     ROS: Except as mentioned is negative and limited by his clinical condition    Objective: Vital signs in last 24 hours: Temp:  [97 F (36.1 C)-98.1 F (36.7 C)] 97 F (36.1 C) (02/22 0400) Pulse Rate:  [88-123] 111 (02/22 0600) Resp:  [13-32] 13 (02/22 0600) BP: (81-156)/(47-77) 126/77 mmHg (02/22 0600) SpO2:  [88 %-100 %] 94 % (02/22 0600) FiO2 (%):  [30 %-40 %] 35 % (02/22 0400) Weight:  [68.04 kg (150 lb)] 68.04 kg (150 lb) (02/21 1656) Weight change:     Intake/Output from previous day: 02/21 0701 - 02/22 0700 In: 343.8 [I.V.:343.8] Out: 425 [Urine:425]  PHYSICAL EXAM He is awake and alert and in moderate distress. He is on BiPAP. His pupils are reactive nose and throat are clear mucous membranes are dry his neck is supple without masses. His chest shows decreased breath sounds. Heart is regular with distant heart sounds. His abdomen is soft without masses extremities showed no edema. Central nervous system exam grossly intact  Lab Results: Basic Metabolic Panel:  Recent Labs  96/04/54 1721 04/06/15 0413  NA 140 141  K 4.4 4.0  CL 95* 98*  CO2 38* 34*  GLUCOSE 109* 165*  BUN 55* 55*  CREATININE 1.09 0.94  CALCIUM 8.7* 8.6*   Liver Function Tests: No results for input(s): AST, ALT, ALKPHOS, BILITOT, PROT, ALBUMIN in the last 72 hours. No results for input(s): LIPASE, AMYLASE in the last 72 hours. No results for input(s): AMMONIA in the last 72 hours. CBC:  Recent Labs  04/05/15 1721 04/06/15 0413  WBC 7.0 5.3  NEUTROABS 5.6  --   HGB 13.5 12.2*  HCT 42.8 39.8  MCV 98.6 98.0  PLT 131* 144*   Cardiac Enzymes:  Recent Labs  04/05/15 1721  TROPONINI <0.03   BNP: No results for input(s): PROBNP in the last 72 hours. D-Dimer: No results for input(s): DDIMER in the last 72 hours. CBG: No results for input(s): GLUCAP in the last 72 hours. Hemoglobin A1C: No results  for input(s): HGBA1C in the last 72 hours. Fasting Lipid Panel: No results for input(s): CHOL, HDL, LDLCALC, TRIG, CHOLHDL, LDLDIRECT in the last 72 hours. Thyroid Function Tests: No results for input(s): TSH, T4TOTAL, FREET4, T3FREE, THYROIDAB in the last 72 hours. Anemia Panel: No results for input(s): VITAMINB12, FOLATE, FERRITIN, TIBC, IRON, RETICCTPCT in the last 72 hours. Coagulation: No results for input(s): LABPROT, INR in the last 72 hours. Urine Drug Screen: Drugs of Abuse     Component Value Date/Time   LABOPIA NONE DETECTED 04/23/2011 1259   COCAINSCRNUR NONE DETECTED 04/23/2011 1259   LABBENZ NONE DETECTED 04/23/2011 1259   AMPHETMU NONE DETECTED 04/23/2011 1259   THCU NONE DETECTED 04/23/2011 1259   LABBARB NONE DETECTED 04/23/2011 1259    Alcohol Level: No results for input(s): ETH in the last 72 hours. Urinalysis: No results for input(s): COLORURINE, LABSPEC, PHURINE, GLUCOSEU, HGBUR, BILIRUBINUR, KETONESUR, PROTEINUR, UROBILINOGEN, NITRITE, LEUKOCYTESUR in the last 72 hours.  Invalid input(s): APPERANCEUR Misc. Labs:   ABGS:  Recent Labs  04/06/15 0236  PHART 7.327*  PO2ART 69.1*  TCO2 17.4  HCO3 29.6*     MICROBIOLOGY: Recent Results (from the past 240 hour(s))  MRSA PCR Screening     Status: None   Collection Time: 04/05/15 10:20 PM  Result Value Ref Range Status   MRSA by PCR NEGATIVE NEGATIVE Final    Comment:        The GeneXpert MRSA Assay (FDA approved for NASAL specimens only), is one component of a comprehensive MRSA colonization surveillance program. It is not intended to diagnose MRSA infection nor to guide or monitor treatment for MRSA infections.     Studies/Results: Dg Chest Portable 1 View  04/05/2015  CLINICAL DATA:  Short of breath for 1 day EXAM: PORTABLE CHEST 1 VIEW COMPARISON:  07/02/2013 FINDINGS: Diffuse vascular congestion. Upper normal heart size. Kerley B-lines at the lung bases. Hyperaeration. No pneumothorax.  More confluent opacity at the left base may represent airspace edema. IMPRESSION: Findings above for worrisome for mild interstitial edema and vascular congestion. Airspace disease at the left base is nonspecific and may represent focal edema or an inflammatory process. Electronically Signed   By: Jolaine Click M.D.   On: 04/05/2015 17:42    Medications:  Prior to Admission:  Prescriptions prior to admission  Medication Sig Dispense Refill Last Dose  . albuterol (PROVENTIL HFA;VENTOLIN HFA) 108 (90 BASE) MCG/ACT inhaler Inhale 2 puffs into the lungs every 4 (four) hours as needed for wheezing or shortness of breath. For shortness of breath   unknown  . amLODipine (NORVASC) 5 MG tablet Take 5  mg by mouth every morning.   04/05/2015 at Unknown time  . fluticasone furoate-vilanterol (BREO ELLIPTA) 100-25 MCG/INH AEPB Inhale 1 puff into the lungs daily.   04/05/2015 at Unknown time  . lisinopril (PRINIVIL,ZESTRIL) 20 MG tablet Take 20 mg by mouth every morning.    04/05/2015 at Unknown time  . mometasone-formoterol (DULERA) 100-5 MCG/ACT AERO Inhale 2 puffs into the lungs 2 (two) times daily. (Patient taking differently: Inhale 1 puff into the lungs 2 (two) times daily. ) 1 Inhaler 0 04/05/2015 at Unknown time  . traZODone (DESYREL) 100 MG tablet Take 100 mg by mouth at bedtime.   04/04/2015 at Unknown time  . umeclidinium-vilanterol (ANORO ELLIPTA) 62.5-25 MCG/INH AEPB Inhale 1 puff into the lungs 2 (two) times daily.   04/05/2015 at Unknown time   Scheduled: . sodium chloride   Intravenous STAT  . enoxaparin (LOVENOX) injection  40 mg Subcutaneous Q24H  . ipratropium-albuterol  3 mL Nebulization Q4H  . levofloxacin (LEVAQUIN) IV  750 mg Intravenous Q24H  . sodium chloride flush  3 mL Intravenous Q12H   Continuous:  ZOX:WRUEAV chloride, acetaminophen **OR** acetaminophen, alum & mag hydroxide-simeth, HYDROmorphone (DILAUDID) injection, ondansetron **OR** ondansetron (ZOFRAN) IV, sodium chloride  flush  Assesment: He was admitted with community-acquired pneumonia acute hypoxic and hypercapnic respiratory failure. He is on BiPAP. He came very close to requiring intubation last night but has improved somewhat. He still has at least 50% possibility that he is going to need to be intubated and placed on mechanical ventilation based on his clinical picture now. He has chronic hypoxic respiratory failure at baseline. He is on appropriate antibiotic treatment. He is on nebulizer treatment. It appeared that he improved with morphine which was given once. He has hydromorphone ordered. I will discontinue this and change him to morphine which was effective. He needs to have the addition of IV steroids and I've done that. Active Problems:   Acute respiratory failure with hypoxia and hypercarbia (HCC)   Hypercapnic respiratory failure (HCC)   COPD exacerbation (HCC)    Plan: Continue current medications and treatments except as noted above. He may tire so he needs to remain in the intensive care unit because of the possibility that he is going to tire and need to be intubated and placed on mechanical ventilation. Thanks for allowing me to see him with you    LOS: 1 day   Tram Wrenn L 04/06/2015, 7:17 AM

## 2015-04-06 NOTE — Progress Notes (Signed)
0210- Pt suddenly awakened from sleep, restless, confused, impulsive- attempting to remove PIV, attempting to get out of bed. Increased WOB, dyspneic, accessory muscle use. Duoneb treatment given early with little improvement in breath sounds/WOB. MD Deterding notified, ABG obtained.  Morphine ordered. ABG results pH-7.32,pCO2-66.2, pO2-69, HCO3-29.6, sats-93. MD in house paged. Order for continuous neb. Slight improvement in WOB. Wife Debarah Crape updated on pt status. Will continue to monitor. Athalia Setterlund L

## 2015-04-07 DIAGNOSIS — J9622 Acute and chronic respiratory failure with hypercapnia: Secondary | ICD-10-CM

## 2015-04-07 LAB — BASIC METABOLIC PANEL
ANION GAP: 6 (ref 5–15)
BUN: 42 mg/dL — AB (ref 6–20)
CHLORIDE: 100 mmol/L — AB (ref 101–111)
CO2: 34 mmol/L — ABNORMAL HIGH (ref 22–32)
Calcium: 8.4 mg/dL — ABNORMAL LOW (ref 8.9–10.3)
Creatinine, Ser: 0.78 mg/dL (ref 0.61–1.24)
GFR calc Af Amer: >60 mL/min (ref >60)
GFR calc non Af Amer: >60 mL/min (ref >60)
GLUCOSE: 148 mg/dL — AB (ref 65–99)
POTASSIUM: 3.9 mmol/L (ref 3.5–5.1)
SODIUM: 140 mmol/L (ref 135–145)

## 2015-04-07 LAB — CBC
HEMATOCRIT: 38.9 % — AB (ref 39.0–52.0)
HEMOGLOBIN: 12.3 g/dL — AB (ref 13.0–17.0)
MCH: 30.6 pg (ref 26.0–34.0)
MCHC: 31.6 g/dL (ref 30.0–36.0)
MCV: 96.8 fL (ref 78.0–100.0)
Platelets: 176 10*3/uL (ref 150–400)
RBC: 4.02 MIL/uL — ABNORMAL LOW (ref 4.22–5.81)
RDW: 13.4 % (ref 11.5–15.5)
WBC: 13.1 10*3/uL — AB (ref 4.0–10.5)

## 2015-04-07 MED ORDER — TRAZODONE HCL 50 MG PO TABS
100.0000 mg | ORAL_TABLET | Freq: Every day | ORAL | Status: DC
  Administered 2015-04-07: 100 mg via ORAL
  Filled 2015-04-07: qty 2

## 2015-04-07 MED ORDER — MOMETASONE FURO-FORMOTEROL FUM 100-5 MCG/ACT IN AERO
2.0000 | INHALATION_SPRAY | Freq: Two times a day (BID) | RESPIRATORY_TRACT | Status: DC
  Administered 2015-04-07 – 2015-04-18 (×15): 2 via RESPIRATORY_TRACT
  Filled 2015-04-07: qty 8.8

## 2015-04-07 MED ORDER — AMLODIPINE BESYLATE 5 MG PO TABS
5.0000 mg | ORAL_TABLET | Freq: Every day | ORAL | Status: DC
  Administered 2015-04-07 – 2015-04-15 (×7): 5 mg via ORAL
  Filled 2015-04-07 (×7): qty 1

## 2015-04-07 MED ORDER — LISINOPRIL 10 MG PO TABS
20.0000 mg | ORAL_TABLET | Freq: Every day | ORAL | Status: DC
  Administered 2015-04-07 – 2015-04-17 (×9): 20 mg via ORAL
  Filled 2015-04-07 (×11): qty 2

## 2015-04-07 NOTE — Progress Notes (Signed)
Subjective: He says he feels substantially better today. He did not require BiPAP last night. He is on nasal cannula now.  Objective: Vital signs in last 24 hours: Temp:  [97.4 F (36.3 C)-98.7 F (37.1 C)] 97.5 F (36.4 C) (02/23 0812) Pulse Rate:  [89-115] 105 (02/23 0500) Resp:  [16-25] 20 (02/23 0500) BP: (132-173)/(60-91) 173/84 mmHg (02/23 0500) SpO2:  [91 %-100 %] 95 % (02/23 0500) Weight:  [62.6 kg (138 lb 0.1 oz)] 62.6 kg (138 lb 0.1 oz) (02/23 0500) Weight change: -5.439 kg (-11 lb 15.9 oz)    Intake/Output from previous day: 02/22 0701 - 02/23 0700 In: -  Out: 1221 [Urine:1220; Stool:1]  PHYSICAL EXAM General appearance: alert, cooperative, mild distress and Much less short of breath than yesterday Resp: rhonchi bilaterally Cardio: regular rate and rhythm, S1, S2 normal, no murmur, click, rub or gallop GI: soft, non-tender; bowel sounds normal; no masses,  no organomegaly Extremities: extremities normal, atraumatic, no cyanosis or edema  Lab Results:  Results for orders placed or performed during the hospital encounter of 04/05/15 (from the past 48 hour(s))  Blood gas, arterial ([facility] & [facility] ONLY)     Status: Abnormal   Collection Time: 04/05/15  5:20 PM  Result Value Ref Range   O2 Content 2.0 L/min   Delivery systems NASAL CANNULA    pH, Arterial 7.251 (L) 7.350 - 7.450   pCO2 arterial 83.7 (HH) 35.0 - 45.0 mmHg    Comment: CRITICAL RESULT CALLED TO, READ BACK BY AND VERIFIED WITH: CREWS,M.RN 04/05/15 AT 1732 BY BROADNAX,L.RRT    pO2, Arterial 59.9 (L) 80.0 - 100.0 mmHg   Bicarbonate 29.2 (H) 20.0 - 24.0 mEq/L   Acid-Base Excess 8.5 (H) 0.0 - 2.0 mmol/L   O2 Saturation 86.9 %   Collection site RIGHT RADIAL    Drawn by [id number]    Sample type ARTERIAL    Allens test (pass/fail) PASS PASS  CBC with Differential/Platelet     Status: Abnormal   Collection Time: 04/05/15  5:21 PM  Result Value Ref Range   WBC 7.0 4.0 - 10.5 K/uL   RBC 4.34 4.22 - 5.81 MIL/uL   Hemoglobin 13.5 13.0 - 17.0 g/dL   HCT 42.8 39.0 - 52.0 %   MCV 98.6 78.0 - 100.0 fL   MCH 31.1 26.0 - 34.0 pg   MCHC 31.5 30.0 - 36.0 g/dL   RDW 13.3 11.5 - 15.5 %   Platelets 131 (L) 150 - 400 K/uL   Neutrophils Relative % 79 %   Neutro Abs 5.6 1.7 - 7.7 K/uL   Lymphocytes Relative 11 %   Lymphs Abs 0.7 0.7 - 4.0 K/uL   Monocytes Relative 10 %   Monocytes Absolute 0.7 0.1 - 1.0 K/uL   Eosinophils Relative 0 %   Eosinophils Absolute 0.0 0.0 - 0.7 K/uL   Basophils Relative 0 %   Basophils Absolute 0.0 0.0 - 0.1 K/uL  Basic metabolic panel     Status: Abnormal   Collection Time: 04/05/15  5:21 PM  Result Value Ref Range   Sodium 140 135 - 145 mmol/L   Potassium 4.4 3.5 - 5.1 mmol/L   Chloride 95 (L) 101 - 111 mmol/L   CO2 38 (H) 22 - 32 mmol/L   Glucose, Bld 109 (H) 65 - 99 mg/dL   BUN 55 (H) 6 - 20 mg/dL   Creatinine, Ser 1.09 0.61 - 1.24 mg/dL   Calcium 8.7 (L) 8.9 - 10.3 mg/dL   GFR calc  non Af Amer >60 >60 mL/min   GFR calc Af Amer >60 >60 mL/min    Comment: (NOTE) The eGFR has been calculated using the CKD EPI equation. This calculation has not been validated in all clinical situations. eGFR's persistently <60 mL/min signify possible Chronic Kidney Disease.    Anion gap 7 5 - 15  Troponin I     Status: None   Collection Time: 04/05/15  5:21 PM  Result Value Ref Range   Troponin I <0.03 <0.031 ng/mL    Comment:        NO INDICATION OF MYOCARDIAL INJURY.   Brain natriuretic peptide     Status: Abnormal   Collection Time: 04/05/15  5:21 PM  Result Value Ref Range   B Natriuretic Peptide 170.0 (H) 0.0 - 100.0 pg/mL  Blood gas, arterial     Status: Abnormal   Collection Time: 04/05/15  7:55 PM  Result Value Ref Range   FIO2 40.00    Delivery systems BILEVEL POSITIVE AIRWAY PRESSURE    Inspiratory PAP 18.0    Expiratory PAP 8.0    pH, Arterial 7.205 (L) 7.350 - 7.450   pCO2 arterial 91.4 (HH) 35.0 - 45.0 mmHg    Comment: CRITICAL RESULT CALLED TO, READ BACK BY  AND VERIFIED WITH: JOHNSON,J RN AT 2011 04/05/15 PITTMAN,S RRT/RCP    pO2, Arterial 82.7 80.0 - 100.0 mmHg   Bicarbonate 27.4 (H) 20.0 - 24.0 mEq/L   TCO2 18.2 0 - 100 mmol/L   Acid-Base Excess 7.1 (H) 0.0 - 2.0 mmol/L   O2 Saturation 94.0 %   Patient temperature 37.0    Collection site RIGHT RADIAL    Drawn by [id number]    Sample type ARTERIAL    Allens test (pass/fail) PASS PASS  MRSA PCR Screening     Status: None   Collection Time: 04/05/15 10:20 PM  Result Value Ref Range   MRSA by PCR NEGATIVE NEGATIVE    Comment:        The GeneXpert MRSA Assay (FDA approved for NASAL specimens only), is one component of a comprehensive MRSA colonization surveillance program. It is not intended to diagnose MRSA infection nor to guide or monitor treatment for MRSA infections.   Blood gas, arterial     Status: Abnormal   Collection Time: 04/05/15 10:47 PM  Result Value Ref Range   FIO2 0.30    Delivery systems BILEVEL POSITIVE AIRWAY PRESSURE    LHR 14.0 resp/min   Inspiratory PAP 20    Expiratory PAP 10    pH, Arterial 7.307 (L) 7.350 - 7.450   pCO2 arterial 70.4 (HH) 35.0 - 45.0 mmHg    Comment: CRITICAL RESULT CALLED TO, READ BACK BY AND VERIFIED WITH: MEREDITH SMITH, RN BY BFRETWELL RRT,RCP ON 04/05/15 AT 2300    pO2, Arterial 57.8 (L) 80.0 - 100.0 mmHg   Bicarbonate 29.6 (H) 20.0 - 24.0 mEq/L   TCO2 15.5 0 - 100 mmol/L   Acid-Base Excess 7.9 (H) 0.0 - 2.0 mmol/L   O2 Saturation 88.8 %   Patient temperature 98.1    Collection site RIGHT RADIAL    Drawn by [id number]    Sample type ARTERIAL DRAW    Allens test (pass/fail) PASS PASS  Blood gas, arterial     Status: Abnormal   Collection Time: 04/06/15  2:36 AM  Result Value Ref Range   FIO2 0.35    Delivery systems BILEVEL POSITIVE AIRWAY PRESSURE    LHR 14.0 resp/min   Inspiratory PAP  24    Expiratory PAP 12    pH, Arterial 7.327 (L) 7.350 - 7.450   pCO2 arterial 66.2 (HH) 35.0 - 45.0 mmHg    Comment: CRITICAL RESULT CALLED  TO, READ BACK BY AND VERIFIED WITH: MEREDITH SMITH,RN BY BFRETWELL RRT,RCP ON 04/06/15 AT 0247    pO2, Arterial 69.1 (L) 80.0 - 100.0 mmHg   Bicarbonate 29.6 (H) 20.0 - 24.0 mEq/L   TCO2 17.4 0 - 100 mmol/L   Acid-Base Excess 7.8 (H) 0.0 - 2.0 mmol/L   O2 Saturation 93.1 %   Collection site RIGHT RADIAL    Drawn by [id number]    Sample type ARTERIAL DRAW    Allens test (pass/fail) PASS PASS  Basic metabolic panel     Status: Abnormal   Collection Time: 04/06/15  4:13 AM  Result Value Ref Range   Sodium 141 135 - 145 mmol/L   Potassium 4.0 3.5 - 5.1 mmol/L   Chloride 98 (L) 101 - 111 mmol/L   CO2 34 (H) 22 - 32 mmol/L   Glucose, Bld 165 (H) 65 - 99 mg/dL   BUN 55 (H) 6 - 20 mg/dL   Creatinine, Ser 0.94 0.61 - 1.24 mg/dL   Calcium 8.6 (L) 8.9 - 10.3 mg/dL   GFR calc non Af Amer >60 >60 mL/min   GFR calc Af Amer >60 >60 mL/min    Comment: (NOTE) The eGFR has been calculated using the CKD EPI equation. This calculation has not been validated in all clinical situations. eGFR's persistently <60 mL/min signify possible Chronic Kidney Disease.    Anion gap 9 5 - 15  CBC     Status: Abnormal   Collection Time: 04/06/15  4:13 AM  Result Value Ref Range   WBC 5.3 4.0 - 10.5 K/uL   RBC 4.06 (L) 4.22 - 5.81 MIL/uL   Hemoglobin 12.2 (L) 13.0 - 17.0 g/dL   HCT 39.8 39.0 - 52.0 %   MCV 98.0 78.0 - 100.0 fL   MCH 30.0 26.0 - 34.0 pg   MCHC 30.7 30.0 - 36.0 g/dL   RDW 13.4 11.5 - 15.5 %   Platelets 144 (L) 150 - 400 K/uL  Glucose, capillary     Status: Abnormal   Collection Time: 04/06/15  8:01 AM  Result Value Ref Range   Glucose-Capillary 124 (H) 65 - 99 mg/dL   Comment 1 Notify RN    Comment 2 Document in Chart   Basic metabolic panel     Status: Abnormal   Collection Time: 04/07/15  4:53 AM  Result Value Ref Range   Sodium 140 135 - 145 mmol/L   Potassium 3.9 3.5 - 5.1 mmol/L   Chloride 100 (L) 101 - 111 mmol/L   CO2 34 (H) 22 - 32 mmol/L   Glucose, Bld 148 (H) 65 - 99 mg/dL    BUN 42 (H) 6 - 20 mg/dL   Creatinine, Ser 0.78 0.61 - 1.24 mg/dL   Calcium 8.4 (L) 8.9 - 10.3 mg/dL   GFR calc non Af Amer >60 >60 mL/min   GFR calc Af Amer >60 >60 mL/min    Comment: (NOTE) The eGFR has been calculated using the CKD EPI equation. This calculation has not been validated in all clinical situations. eGFR's persistently <60 mL/min signify possible Chronic Kidney Disease.    Anion gap 6 5 - 15  CBC     Status: Abnormal   Collection Time: 04/07/15  4:53 AM  Result Value Ref Range  WBC 13.1 (H) 4.0 - 10.5 K/uL   RBC 4.02 (L) 4.22 - 5.81 MIL/uL   Hemoglobin 12.3 (L) 13.0 - 17.0 g/dL   HCT 38.9 (L) 39.0 - 52.0 %   MCV 96.8 78.0 - 100.0 fL   MCH 30.6 26.0 - 34.0 pg   MCHC 31.6 30.0 - 36.0 g/dL   RDW 13.4 11.5 - 15.5 %   Platelets 176 150 - 400 K/uL    ABGS  Recent Labs  04/06/15 0236  PHART 7.327*  PO2ART 69.1*  TCO2 17.4  HCO3 29.6*   CULTURES Recent Results (from the past 240 hour(s))  MRSA PCR Screening     Status: None   Collection Time: 04/05/15 10:20 PM  Result Value Ref Range Status   MRSA by PCR NEGATIVE NEGATIVE Final    Comment:        The GeneXpert MRSA Assay (FDA approved for NASAL specimens only), is one component of a comprehensive MRSA colonization surveillance program. It is not intended to diagnose MRSA infection nor to guide or monitor treatment for MRSA infections.    Studies/Results: Dg Chest Portable 1 View  04/05/2015  CLINICAL DATA:  Short of breath for 1 day EXAM: PORTABLE CHEST 1 VIEW COMPARISON:  07/02/2013 FINDINGS: Diffuse vascular congestion. Upper normal heart size. Kerley B-lines at the lung bases. Hyperaeration. No pneumothorax. More confluent opacity at the left base may represent airspace edema. IMPRESSION: Findings above for worrisome for mild interstitial edema and vascular congestion. Airspace disease at the left base is nonspecific and may represent focal edema or an inflammatory process. Electronically Signed    By: Marybelle Killings M.D.   On: 04/05/2015 17:42    Medications:  Prior to Admission:  Prescriptions prior to admission  Medication Sig Dispense Refill Last Dose  . albuterol (PROVENTIL HFA;VENTOLIN HFA) 108 (90 BASE) MCG/ACT inhaler Inhale 2 puffs into the lungs every 4 (four) hours as needed for wheezing or shortness of breath. For shortness of breath   unknown  . amLODipine (NORVASC) 5 MG tablet Take 5 mg by mouth every morning.   04/05/2015 at Unknown time  . fluticasone furoate-vilanterol (BREO ELLIPTA) 100-25 MCG/INH AEPB Inhale 1 puff into the lungs daily.   04/05/2015 at Unknown time  . lisinopril (PRINIVIL,ZESTRIL) 20 MG tablet Take 20 mg by mouth every morning.    04/05/2015 at Unknown time  . mometasone-formoterol (DULERA) 100-5 MCG/ACT AERO Inhale 2 puffs into the lungs 2 (two) times daily. (Patient taking differently: Inhale 1 puff into the lungs 2 (two) times daily. ) 1 Inhaler 0 04/05/2015 at Unknown time  . traZODone (DESYREL) 100 MG tablet Take 100 mg by mouth at bedtime.   04/04/2015 at Unknown time  . umeclidinium-vilanterol (ANORO ELLIPTA) 62.5-25 MCG/INH AEPB Inhale 1 puff into the lungs 2 (two) times daily.   04/05/2015 at Unknown time   Scheduled: . enoxaparin (LOVENOX) injection  40 mg Subcutaneous Q24H  . guaiFENesin  1,200 mg Oral BID  . ipratropium-albuterol  3 mL Nebulization Q4H  . levofloxacin (LEVAQUIN) IV  750 mg Intravenous Q24H  . methylPREDNISolone (SOLU-MEDROL) injection  80 mg Intravenous Q6H  . sodium chloride flush  3 mL Intravenous Q12H   Continuous: . sodium chloride     MVV:KPQAES chloride, acetaminophen **OR** acetaminophen, alum & mag hydroxide-simeth, morphine injection, ondansetron **OR** ondansetron (ZOFRAN) IV, sodium chloride flush  Assesment: He was admitted with acute hypoxic and hypercapnic respiratory failure. He is markedly improved. He had significant respiratory distress and I saw him yesterday  requiring BiPAP but has improved. He has  pneumonia and is on IV Levaquin. Active Problems:   Acute respiratory failure with hypoxia and hypercarbia (HCC)   Hypercapnic respiratory failure (HCC)   COPD exacerbation (HCC)    Plan: Continue current treatments    LOS: 2 days   Maryann Mccall L 04/07/2015, 8:48 AM

## 2015-04-07 NOTE — Progress Notes (Signed)
[facility] PROGRESS NOTE  WEST BOOMERSHINE ZOX:[id number] DOB: 1945-10-19 DOA: 04/05/2015 PCP: [name], MD  Assessment/Plan: 1. Acute on chronic respiratory failure with hypoxia and hypercarbia. Improving. Initially required BiPAP/O2, but has since been weaned down to a nasal cannula. He normally uses 2.5L O2 at home. Will continue to wean down O2 as tolerated 2. COPD exacerbation. Slowly improving, continue nebs, steroids, abx, and pulmonary hygiene. Appreciate pulmonology assistance.  3. CAP, WBC wnl and afebrile. Continue IV Levaquin. 4. Essential HTN, stable. Continue home meds 5. Leukocytosis, likely related to steroids   Code Status: Full DVT prophylaxis: Lovenox Family Communication: No family at bedside Disposition Plan: Transfer to medical bed. Anticipate discharge in 24-48 hours.    Consultants:  Pulmonology  Procedures:  none  Antibiotics:  Levquin 2/21 >>  HPI/Subjective: Breathing is doing better then yesterday. Denies coughing and wheezing. Has not been able to sleep well since admission.   Objective: Filed Vitals:   04/07/15 0400 04/07/15 0500  BP: 154/90 173/84  Pulse: 89 105  Temp: 98.3 F (36.8 C)   Resp: 21 20    Intake/Output Summary (Last 24 hours) at 04/07/15 8119 Last data filed at 04/07/15 0500  Gross per 24 hour  Intake      0 ml  Output   1221 ml  Net  -1221 ml   Filed Weights   04/05/15 1656 04/07/15 0500  Weight: 68.04 kg (150 lb) 62.6 kg (138 lb 0.1 oz)    Exam: General: NAD, looks comfortable Cardiovascular: RRR, S1, S2  Respiratory: Mild wheeze bilaterally. Normal respiratory efforts. Able to speak in full sentences.  Abdomen: soft, non tender, no distention , bowel sounds normal Musculoskeletal: No edema b/l  Data Reviewed: Basic Metabolic Panel:  Recent Labs Lab 04/05/15 1721 04/06/15 0413 04/07/15 0453  NA 140 141 140  K 4.4 4.0 3.9  CL 95* 98* 100*  CO2 38* 34* 34*  GLUCOSE 109* 165*  148*  BUN 55* 55* 42*  CREATININE 1.09 0.94 0.78  CALCIUM 8.7* 8.6* 8.4*   CBC:  Recent Labs Lab 04/05/15 1721 04/06/15 0413 04/07/15 0453  WBC 7.0 5.3 13.1*  NEUTROABS 5.6  --   --   HGB 13.5 12.2* 12.3*  HCT 42.8 39.8 38.9*  MCV 98.6 98.0 96.8  PLT 131* 144* 176   Cardiac Enzymes:  Recent Labs Lab 04/05/15 1721  TROPONINI <0.03   BNP (last 3 results)  Recent Labs  04/05/15 1721  BNP 170.0*     Recent Results (from the past 240 hour(s))  MRSA PCR Screening     Status: None   Collection Time: 04/05/15 10:20 PM  Result Value Ref Range Status   MRSA by PCR NEGATIVE NEGATIVE Final    Comment:        The GeneXpert MRSA Assay (FDA approved for NASAL specimens only), is one component of a comprehensive MRSA colonization surveillance program. It is not intended to diagnose MRSA infection nor to guide or monitor treatment for MRSA infections.      Studies: Dg Chest Portable 1 View  04/05/2015  CLINICAL DATA:  Short of breath for 1 day EXAM: PORTABLE CHEST 1 VIEW COMPARISON:  07/02/2013 FINDINGS: Diffuse vascular congestion. Upper normal heart size. Kerley B-lines at the lung bases. Hyperaeration. No pneumothorax. More confluent opacity at the left base may represent airspace edema. IMPRESSION: Findings above for worrisome for mild interstitial edema and vascular congestion. Airspace disease at the left base is nonspecific and may represent focal edema  or an inflammatory process. Electronically Signed   By: Jolaine Click M.D.   On: 04/05/2015 17:42    Scheduled Meds: . enoxaparin (LOVENOX) injection  40 mg Subcutaneous Q24H  . guaiFENesin  1,200 mg Oral BID  . ipratropium-albuterol  3 mL Nebulization Q4H  . levofloxacin (LEVAQUIN) IV  750 mg Intravenous Q24H  . methylPREDNISolone (SOLU-MEDROL) injection  80 mg Intravenous Q6H  . sodium chloride flush  3 mL Intravenous Q12H   Continuous Infusions: . sodium chloride      Active Problems:   Acute respiratory  failure with hypoxia and hypercarbia (HCC)   Hypercapnic respiratory failure (HCC)   COPD exacerbation (HCC)   Time spent: 25 minutes  Erick Blinks, MD. [facility] Pager [phone]. If 7PM-7AM, please contact night-coverage at [email or web address], password [email or web address] 04/07/2015, 6:28 AM  LOS: 2 days      By signing my name below, I, Shane Todd, attest that this documentation has been prepared under the direction and in the presence of Erick Blinks, MD. Electronically signed: [name], Scribe. 04/07/2015 9:56am   I, Dr. Erick Blinks, personally performed the services described in this documentaiton. All medical record entries made by the scribe were at my direction and in my presence. I have reviewed the chart and agree that the record reflects my personal performance and is accurate and complete  Erick Blinks, MD, 04/07/2015 10:15 AM

## 2015-04-07 NOTE — Evaluation (Signed)
Physical Therapy Evaluation Patient Details Name: Shane Todd MRN: [id number] DOB: 03/10/1945 Today's Date: 04/07/2015   History of Present Illness  70yo white male comes to [facility] on 2/21 p increased SOB, AMS, and eventual somnolence. PMH: COPD on 2L at home since 2016. Pt found to be hypercapnic (pCO2: 83) and imaging showing likely LLL PNA. Admiteed for hypercapnic respiratory failure.   Clinical Impression  At evaluation, pt is received semirecumbent in bed upon entry, wife present. The pt is awake and agreeable to participate. No acute distress noted at this time. The pt is alert and oriented x3, pleasant, conversational, and following simple commands consistently, however is a limited historian, at times presenting with confusion, and other deferring to wife for responses. Pt reports zero falls in the last 6 months, however demonstrates multiple mild LOB throughout session requiring minimal physical assistance for correction and avoidance of fall; pt attests to some balance deficits at baseline, but thinks it is now worse. Global strength as screened during functional mobility assessment presents with mild impairment, but requires no physical assistance for bed mobility, transfers, and gait, whereas the patient performs these indep at baseline. Pt falls risk is high as evidenced by slow gait speed, stumbles with retroamb, and high falls anxiety during transfers and mobility. Pt received on and remaining on 3L O2 throughout evaluation, with noted desaturation with activity (low 80's%) that requires 5L /min + 3 minutes for full recovery. Pt is on 2L/min O2 at baseline at home. Patient presenting with impairment of strength, range of motion, balance, oxygen perfusion, and activity tolerance, limiting ability to perform ADL and mobility tasks at  baseline level of function. Patient will benefit from skilled intervention to address the above impairments and limitations, in order to restore to prior level  of function, improve patient safety upon discharge, and to decrease falls risk.       Follow Up Recommendations Home health PT;Supervision for mobility/OOB    Equipment Recommendations  Cane    Recommendations for Other Services       Precautions / Restrictions Precautions Precautions: Fall Restrictions Weight Bearing Restrictions: No      Mobility  Bed Mobility Overal bed mobility: Modified Independent             General bed mobility comments: help with lines/leads  Transfers Overall transfer level: Needs assistance Equipment used: None Transfers: Sit to/from Stand Sit to Stand: Min guard         General transfer comment: help with lines/leads  Ambulation/Gait Ambulation/Gait assistance: Min guard Ambulation Distance (Feet): 64 Feet Assistive device: None     Gait velocity interpretation: <1.8 ft/sec, indicative of risk for recurrent falls General Gait Details: alternating AMB and retro amb; O2 sats ~96-98% for first 2 minutes, then drop into 80's after gait trial c poor recovery.   Stairs            Wheelchair Mobility    Modified Rankin (Stroke Patients Only)       Balance Overall balance assessment: History of Falls (not forerly assess; multiple LOB during retroAMB, require minA for correction. )                                           Pertinent Vitals/Pain Pain Assessment: No/denies pain    Home Living Family/patient expects to be discharged to:: Private residence Living Arrangements: Spouse/significant other Available Help at  Discharge: Family Type of Home: House Home Access: Level entry     Home Layout: Two level;Able to live on main level with bedroom/bathroom Home Equipment: Other (comment) (O2 units. )      Prior Function Level of Independence: Needs assistance   Gait / Transfers Assistance Needed: noncompliant with O2 with activity, regularly drops into 70's after taking trash out or getting mail.    ADL's / Homemaking Assistance Needed: Indep  Comments: Pt confirms non compliance is related to being unhappy with burden of carrying O2 during activity.      Hand Dominance        Extremity/Trunk Assessment   Upper Extremity Assessment: Overall WFL for tasks assessed;Generalized weakness           Lower Extremity Assessment: Generalized weakness;Overall WFL for tasks assessed         Communication   Communication: No difficulties  Cognition Arousal/Alertness: Awake/alert Behavior During Therapy: WFL for tasks assessed/performed Overall Cognitive Status: Difficult to assess                      General Comments      Exercises        Assessment/Plan    PT Assessment Patient needs continued PT services  PT Diagnosis Generalized weakness;Abnormality of gait;Altered mental status   PT Problem List Decreased strength;Decreased activity tolerance;Decreased mobility;Decreased cognition;Cardiopulmonary status limiting activity;Decreased knowledge of precautions  PT Treatment Interventions DME instruction;Gait training;Functional mobility training;Therapeutic activities;Therapeutic exercise;Balance training;Patient/family education   PT Goals (Current goals can be found in the Care Plan section) Acute Rehab PT Goals Patient Stated Goal: Ill do whatever my wife tells me to.  PT Goal Formulation: With patient/family Time For Goal Achievement: 04/21/15 Potential to Achieve Goals: Fair    Frequency Min 3X/week   Barriers to discharge        Co-evaluation               End of Session Equipment Utilized During Treatment: Gait belt;Oxygen Activity Tolerance: Patient tolerated treatment well;Treatment limited secondary to medical complications (Comment) (tachycardia, O2 desat) Patient left: in chair;with call bell/phone within reach;with family/visitor present Nurse Communication: Mobility status         Time: 9528-4132 PT Time Calculation (min)  (ACUTE ONLY): 26 min   Charges:   PT Evaluation $PT Eval Moderate Complexity: 1 Procedure PT Treatments $Therapeutic Activity: 8-22 mins   PT G Codes:       1:56 PM, 2015/05/04 Rosamaria Lints, PT, DPT PRN Physical Therapist at [facility] [place] License # [id number] [phone] (wireless)  [phone] (mobile)

## 2015-04-07 NOTE — Care Management Note (Signed)
Case Management Note  Patient Details  Name: AUBRY TUCHOLSKI MRN: [id number] Date of Birth: 1945/08/07  Subjective/Objective:                  Pt admitted with resp failure. Pt is from home, lives with wife and is ind with ADL's. Pt has cane and home O2 with prot tank prior to admission. Pt gets home O2 from [facility]. PT has recommended HH PT.    Action/Plan: Pt will need to choose [facility] provider and referral needs to be made prior to DC. Will cont to follow.   Expected Discharge Date:  04/08/15               Expected Discharge Plan:  Home w Home Health Services  In-House Referral:  NA  Discharge planning Services  CM Consult  Post Acute Care Choice:  Home Health Choice offered to:  Patient  DME Arranged:    DME Agency:     HH Arranged:    HH Agency:     Status of Service:  In process, will continue to follow  Medicare Important Message Given:    Date Medicare IM Given:    Medicare IM give by:    Date Additional Medicare IM Given:    Additional Medicare Important Message give by:     If discussed at Long Length of Stay Meetings, dates discussed:    Additional Comments:  Malcolm Metro, RN 04/07/2015, 4:46 PM

## 2015-04-07 NOTE — Plan of Care (Signed)
Problem: Acute Rehab PT Goals(only PT should resolve) Goal: Pt Will Ambulate Pt will ambulate with SPC at Supervision on 2L/min O2 for a distance greater than 237ft and with SaO2>88% to demonstrate the ability to perform safe household distance ambulation at discharge.    Goal: Pt Will Go Up/Down Stairs Pt will ascend/descend 3 stairs with SPC without LOB Supervision to demonstrate safe access of the community.

## 2015-04-08 ENCOUNTER — Inpatient Hospital Stay (HOSPITAL_COMMUNITY): Payer: Medicare Other

## 2015-04-08 DIAGNOSIS — G9341 Metabolic encephalopathy: Secondary | ICD-10-CM | POA: Diagnosis present

## 2015-04-08 LAB — BLOOD GAS, ARTERIAL
ACID-BASE EXCESS: 11.1 mmol/L — AB (ref 0.0–2.0)
Acid-Base Excess: 12.6 mmol/L — ABNORMAL HIGH (ref 0.0–2.0)
BICARBONATE: 34.1 meq/L — AB (ref 20.0–24.0)
Bicarbonate: 32.3 mEq/L — ABNORMAL HIGH (ref 20.0–24.0)
DRAWN BY: 277331
Drawn by: 277331
EXPIRATORY PAP: 5
FIO2: 0.6
INSPIRATORY PAP: 15
LHR: 16 {breaths}/min
MODE: POSITIVE
O2 Content: 2.5 L/min
O2 SAT: 98.7 %
O2 Saturation: 99 %
pCO2 arterial: 80.1 mmHg (ref 35.0–45.0)
pCO2 arterial: 80.8 mmHg (ref 35.0–45.0)
pH, Arterial: 7.295 — ABNORMAL LOW (ref 7.350–7.450)
pH, Arterial: 7.307 — ABNORMAL LOW (ref 7.350–7.450)
pO2, Arterial: 143 mmHg — ABNORMAL HIGH (ref 80.0–100.0)
pO2, Arterial: 165 mmHg — ABNORMAL HIGH (ref 80.0–100.0)

## 2015-04-08 LAB — CBC
HEMATOCRIT: 40.8 % (ref 39.0–52.0)
Hemoglobin: 12.5 g/dL — ABNORMAL LOW (ref 13.0–17.0)
MCH: 30.9 pg (ref 26.0–34.0)
MCHC: 30.6 g/dL (ref 30.0–36.0)
MCV: 101 fL — ABNORMAL HIGH (ref 78.0–100.0)
PLATELETS: 204 10*3/uL (ref 150–400)
RBC: 4.04 MIL/uL — ABNORMAL LOW (ref 4.22–5.81)
RDW: 13.5 % (ref 11.5–15.5)
WBC: 11 10*3/uL — ABNORMAL HIGH (ref 4.0–10.5)

## 2015-04-08 LAB — BASIC METABOLIC PANEL
Anion gap: 4 — ABNORMAL LOW (ref 5–15)
BUN: 33 mg/dL — AB (ref 6–20)
CHLORIDE: 99 mmol/L — AB (ref 101–111)
CO2: 39 mmol/L — ABNORMAL HIGH (ref 22–32)
CREATININE: 0.81 mg/dL (ref 0.61–1.24)
Calcium: 8.4 mg/dL — ABNORMAL LOW (ref 8.9–10.3)
GFR calc Af Amer: >60 mL/min (ref >60)
GFR calc non Af Amer: >60 mL/min (ref >60)
GLUCOSE: 134 mg/dL — AB (ref 65–99)
POTASSIUM: 5 mmol/L (ref 3.5–5.1)
Sodium: 142 mmol/L (ref 135–145)

## 2015-04-08 MED ORDER — TRAZODONE HCL 50 MG PO TABS
50.0000 mg | ORAL_TABLET | Freq: Every day | ORAL | Status: DC
  Administered 2015-04-08 – 2015-04-11 (×2): 50 mg via ORAL
  Filled 2015-04-08 (×2): qty 1

## 2015-04-08 NOTE — Progress Notes (Signed)
Patient is alert and oriented. BIPAP on standby at this time. Will place patient on later after he has had his medicines. RN notified.

## 2015-04-08 NOTE — Progress Notes (Signed)
Called Dr. Juanetta Gosling and Talked to Dr. Kerry Hough about ABG and patient being placed on Bi-Pap.

## 2015-04-08 NOTE — Care Management Note (Signed)
Case Management Note  Patient Details  Name: NARADA UZZLE MRN: [id number] Date of Birth: May 16, 1945   Expected Discharge Date:  04/08/15               Expected Discharge Plan:  Home w Home Health Services  In-House Referral:  NA  Discharge planning Services  CM Consult  Post Acute Care Choice:  Home Health Choice offered to:  Patient  DME Arranged:    DME Agency:     HH Arranged:  RN, PT HH Agency:  [facility]  Status of Service:  In process, will continue to follow  Medicare Important Message Given:    Date Medicare IM Given:    Medicare IM give by:    Date Additional Medicare IM Given:    Additional Medicare Important Message give by:     If discussed at Long Length of Stay Meetings, dates discussed:    Additional Comments: Pt now on BIPAP. DC over weekend not anticipated. Pt's wife has chosen [facility] for [facility] and DME needs.  Malcolm Metro, RN 04/08/2015, 4:05 PM

## 2015-04-08 NOTE — Progress Notes (Signed)
**Note De-Identified  Obfuscation** Patient placed on BIPAP post critical ABG; patient tolerating well. SAT 100%, BBS diminished. RRT to continue to monitor.

## 2015-04-08 NOTE — Progress Notes (Signed)
Subjective: He is very confused and much less responsive this morning. He does sit up but he basically just moans in response to questioning.  Objective: Vital signs in last 24 hours: Temp:  [97.5 F (36.4 C)-101.6 F (38.7 C)] 98.3 F (36.8 C) (02/24 0400) Pulse Rate:  [79-125] 84 (02/24 0600) Resp:  [18-26] 21 (02/24 0600) BP: (110-160)/(55-90) 139/66 mmHg (02/24 0600) SpO2:  [84 %-100 %] 100 % (02/24 0600) Weight:  [63.8 kg (140 lb 10.5 oz)] 63.8 kg (140 lb 10.5 oz) (02/24 0500) Weight change: 1.2 kg (2 lb 10.3 oz) Last BM Date: 04/06/15  Intake/Output from previous day: 02/23 0701 - 02/24 0700 In: 300 [IV Piggyback:300] Out: 950 [Urine:950]  PHYSICAL EXAM General appearance: He is clearly confused. He is much less responsive than yesterday. Resp: He has decreased breath sounds some rhonchi and prolonged expiratory phase Cardio: His heart is regular but he has tachycardia to about 120 GI: soft, non-tender; bowel sounds normal; no masses,  no organomegaly Extremities: extremities normal, atraumatic, no cyanosis or edema  Lab Results:  Results for orders placed or performed during the hospital encounter of 04/05/15 (from the past 48 hour(s))  Glucose, capillary     Status: Abnormal   Collection Time: 04/06/15  8:01 AM  Result Value Ref Range   Glucose-Capillary 124 (H) 65 - 99 mg/dL   Comment 1 Notify RN    Comment 2 Document in Chart   Basic metabolic panel     Status: Abnormal   Collection Time: 04/07/15  4:53 AM  Result Value Ref Range   Sodium 140 135 - 145 mmol/L   Potassium 3.9 3.5 - 5.1 mmol/L   Chloride 100 (L) 101 - 111 mmol/L   CO2 34 (H) 22 - 32 mmol/L   Glucose, Bld 148 (H) 65 - 99 mg/dL   BUN 42 (H) 6 - 20 mg/dL   Creatinine, Ser 0.78 0.61 - 1.24 mg/dL   Calcium 8.4 (L) 8.9 - 10.3 mg/dL   GFR calc non Af Amer >60 >60 mL/min   GFR calc Af Amer >60 >60 mL/min    Comment: (NOTE) The eGFR has been calculated using the CKD EPI equation. This calculation  has not been validated in all clinical situations. eGFR's persistently <60 mL/min signify possible Chronic Kidney Disease.    Anion gap 6 5 - 15  CBC     Status: Abnormal   Collection Time: 04/07/15  4:53 AM  Result Value Ref Range   WBC 13.1 (H) 4.0 - 10.5 K/uL   RBC 4.02 (L) 4.22 - 5.81 MIL/uL   Hemoglobin 12.3 (L) 13.0 - 17.0 g/dL   HCT 38.9 (L) 39.0 - 52.0 %   MCV 96.8 78.0 - 100.0 fL   MCH 30.6 26.0 - 34.0 pg   MCHC 31.6 30.0 - 36.0 g/dL   RDW 13.4 11.5 - 15.5 %   Platelets 176 150 - 400 K/uL  Basic metabolic panel     Status: Abnormal   Collection Time: 04/08/15  4:13 AM  Result Value Ref Range   Sodium 142 135 - 145 mmol/L   Potassium 5.0 3.5 - 5.1 mmol/L    Comment: DELTA CHECK NOTED   Chloride 99 (L) 101 - 111 mmol/L   CO2 39 (H) 22 - 32 mmol/L   Glucose, Bld 134 (H) 65 - 99 mg/dL   BUN 33 (H) 6 - 20 mg/dL   Creatinine, Ser 0.81 0.61 - 1.24 mg/dL   Calcium 8.4 (L) 8.9 - 10.3  mg/dL   GFR calc non Af Amer >60 >60 mL/min   GFR calc Af Amer >60 >60 mL/min    Comment: (NOTE) The eGFR has been calculated using the CKD EPI equation. This calculation has not been validated in all clinical situations. eGFR's persistently <60 mL/min signify possible Chronic Kidney Disease.    Anion gap 4 (L) 5 - 15  CBC     Status: Abnormal   Collection Time: 04/08/15  4:13 AM  Result Value Ref Range   WBC 11.0 (H) 4.0 - 10.5 K/uL   RBC 4.04 (L) 4.22 - 5.81 MIL/uL   Hemoglobin 12.5 (L) 13.0 - 17.0 g/dL   HCT 40.8 39.0 - 52.0 %   MCV 101.0 (H) 78.0 - 100.0 fL   MCH 30.9 26.0 - 34.0 pg   MCHC 30.6 30.0 - 36.0 g/dL   RDW 13.5 11.5 - 15.5 %   Platelets 204 150 - 400 K/uL    ABGS  Recent Labs  04/06/15 0236  PHART 7.327*  PO2ART 69.1*  TCO2 17.4  HCO3 29.6*   CULTURES Recent Results (from the past 240 hour(s))  MRSA PCR Screening     Status: None   Collection Time: 04/05/15 10:20 PM  Result Value Ref Range Status   MRSA by PCR NEGATIVE NEGATIVE Final    Comment:         The GeneXpert MRSA Assay (FDA approved for NASAL specimens only), is one component of a comprehensive MRSA colonization surveillance program. It is not intended to diagnose MRSA infection nor to guide or monitor treatment for MRSA infections.    Studies/Results: No results found.  Medications:  Prior to Admission:  Prescriptions prior to admission  Medication Sig Dispense Refill Last Dose  . albuterol (PROVENTIL HFA;VENTOLIN HFA) 108 (90 BASE) MCG/ACT inhaler Inhale 2 puffs into the lungs every 4 (four) hours as needed for wheezing or shortness of breath. For shortness of breath   unknown  . amLODipine (NORVASC) 5 MG tablet Take 5 mg by mouth every morning.   04/05/2015 at Unknown time  . fluticasone furoate-vilanterol (BREO ELLIPTA) 100-25 MCG/INH AEPB Inhale 1 puff into the lungs daily.   04/05/2015 at Unknown time  . lisinopril (PRINIVIL,ZESTRIL) 20 MG tablet Take 20 mg by mouth every morning.    04/05/2015 at Unknown time  . mometasone-formoterol (DULERA) 100-5 MCG/ACT AERO Inhale 2 puffs into the lungs 2 (two) times daily. (Patient taking differently: Inhale 1 puff into the lungs 2 (two) times daily. ) 1 Inhaler 0 04/05/2015 at Unknown time  . traZODone (DESYREL) 100 MG tablet Take 100 mg by mouth at bedtime.   04/04/2015 at Unknown time  . umeclidinium-vilanterol (ANORO ELLIPTA) 62.5-25 MCG/INH AEPB Inhale 1 puff into the lungs 2 (two) times daily.   04/05/2015 at Unknown time   Scheduled: . amLODipine  5 mg Oral Daily  . enoxaparin (LOVENOX) injection  40 mg Subcutaneous Q24H  . guaiFENesin  1,200 mg Oral BID  . ipratropium-albuterol  3 mL Nebulization Q4H  . levofloxacin (LEVAQUIN) IV  750 mg Intravenous Q24H  . lisinopril  20 mg Oral Daily  . methylPREDNISolone (SOLU-MEDROL) injection  80 mg Intravenous Q6H  . mometasone-formoterol  2 puff Inhalation BID  . sodium chloride flush  3 mL Intravenous Q12H  . traZODone  100 mg Oral QHS   Continuous: . sodium chloride      EVO:JJKKXF chloride, acetaminophen **OR** acetaminophen, alum & mag hydroxide-simeth, morphine injection, ondansetron **OR** ondansetron (ZOFRAN) IV, sodium chloride flush  Assesment: He was admitted with acute hypoxic and hypercapnic respiratory failure. He was treated and initially required BiPAP and there was concern that he was going to require intubation and mechanical ventilation early in his course but he got significantly better yesterday. Yesterday he began to have confusion and this morning he is very confused and poorly responsive. This could of course be CO2 narcosis his oxygenation appears to be okay. Active Problems:   Acute respiratory failure with hypoxia and hypercarbia (HCC)   Hypercapnic respiratory failure (HCC)   COPD exacerbation (HCC)    Plan: Check arterial blood gas to see if he has increased PCO2. Continue other treatments. I don't think it's safe to move him out of the intensive care unit at this point. He is high risk for needing intubation and mechanical ventilation    LOS: 3 days   Eriyana Sweeten L 04/08/2015, 7:51 AM

## 2015-04-08 NOTE — Progress Notes (Signed)
**Note De-Identified  Obfuscation** Patient awake and pulling at BIPAP; ABG still in critical level, but BIPAP removed and placed on 4 L [place].  RRT to cont. To monitor.

## 2015-04-08 NOTE — Progress Notes (Signed)
[facility] PROGRESS NOTE  Shane Todd JXB:[id number] DOB: March 31, 1945 DOA: 04/05/2015 PCP: [name], MD  Assessment/Plan: 1. Acute on chronic respiratory failure with hypoxia and hypercarbia. ABG noted for pCO2 elevated at 80.1. Placed on BiPAP. Will continue to monitor pCO2 levels. He may need to be on BiPAP QHS. Recheck chest xray today. 2. Metabolic acute encephalopathy, due to CO2 narcosis. Should hopefully improve with BiPAP therapy.  3. COPD exacerbation. Cotinue nebs, steroids, abx, and pulmonary hygiene. Appreciate pulmonology assistance.  4. CAP, WBC improving and afebrile. Continue IV Levaquin. 5. Essential HTN, stable. Continue home meds 6. Leukocytosis, likely related to steroids   Code Status: Full DVT prophylaxis: Lovenox Family Communication: No family at bedside Disposition Plan: Anticipate discharge in 24-48 hours.    Consultants:  Pulmonology  PT -HHPT   Procedures:  none  Antibiotics:  Levquin 2/21 >>  HPI/Subjective: Patient on BiPAP. Uses hand gestures to answer questions. Indicates breathing is slightly better with mask. This morning he was noted to be increasingly confused and much less responsive.   Objective: Filed Vitals:   04/08/15 0400 04/08/15 0500  BP: 138/69 133/64  Pulse: 91 87  Temp: 98.3 F (36.8 C)   Resp: 19 18    Intake/Output Summary (Last 24 hours) at 04/08/15 0636 Last data filed at 04/08/15 0500  Gross per 24 hour  Intake    300 ml  Output    950 ml  Net   -650 ml   Filed Weights   04/05/15 1656 04/07/15 0500 04/08/15 0500  Weight: 68.04 kg (150 lb) 62.6 kg (138 lb 0.1 oz) 63.8 kg (140 lb 10.5 oz)    Exam:  General: NAD  Cardiovascular: Tachycardiac   Respiratory: Mild wheeze but breath sounds mostly clear  Abdomen: soft, non tender, no distention , bowel sounds normal   Musculoskeletal: No edema b/l  Data Reviewed: Basic Metabolic Panel:  Recent Labs Lab 04/05/15 1721  04/06/15 0413 04/07/15 0453 04/08/15 0413  NA 140 141 140 142  K 4.4 4.0 3.9 5.0  CL 95* 98* 100* 99*  CO2 38* 34* 34* 39*  GLUCOSE 109* 165* 148* 134*  BUN 55* 55* 42* 33*  CREATININE 1.09 0.94 0.78 0.81  CALCIUM 8.7* 8.6* 8.4* 8.4*   CBC:  Recent Labs Lab 04/05/15 1721 04/06/15 0413 04/07/15 0453 04/08/15 0413  WBC 7.0 5.3 13.1* 11.0*  NEUTROABS 5.6  --   --   --   HGB 13.5 12.2* 12.3* 12.5*  HCT 42.8 39.8 38.9* 40.8  MCV 98.6 98.0 96.8 101.0*  PLT 131* 144* 176 204   Cardiac Enzymes:  Recent Labs Lab 04/05/15 1721  TROPONINI <0.03   BNP (last 3 results)  Recent Labs  04/05/15 1721  BNP 170.0*     Recent Results (from the past 240 hour(s))  MRSA PCR Screening     Status: None   Collection Time: 04/05/15 10:20 PM  Result Value Ref Range Status   MRSA by PCR NEGATIVE NEGATIVE Final    Comment:        The GeneXpert MRSA Assay (FDA approved for NASAL specimens only), is one component of a comprehensive MRSA colonization surveillance program. It is not intended to diagnose MRSA infection nor to guide or monitor treatment for MRSA infections.      Studies: No results found.  Scheduled Meds: . amLODipine  5 mg Oral Daily  . enoxaparin (LOVENOX) injection  40 mg Subcutaneous Q24H  . guaiFENesin  1,200 mg Oral BID  .  ipratropium-albuterol  3 mL Nebulization Q4H  . levofloxacin (LEVAQUIN) IV  750 mg Intravenous Q24H  . lisinopril  20 mg Oral Daily  . methylPREDNISolone (SOLU-MEDROL) injection  80 mg Intravenous Q6H  . mometasone-formoterol  2 puff Inhalation BID  . sodium chloride flush  3 mL Intravenous Q12H  . traZODone  100 mg Oral QHS   Continuous Infusions: . sodium chloride      Active Problems:   Acute respiratory failure with hypoxia and hypercarbia (HCC)   Hypercapnic respiratory failure (HCC)   COPD exacerbation (HCC)   Time spent: 25 minutes  Erick Blinks, MD. [facility] Pager [phone]. If 7PM-7AM, please contact  night-coverage at [email or web address], password [facility] 04/08/2015, 6:36 AM  LOS: 3 days      By signing my name below, I, Zadie Cleverly, attest that this documentation has been prepared under the direction and in the presence of Erick Blinks, MD. Electronically signed: Zadie Cleverly, Scribe. 04/08/2015 9:03am  I, Dr. Erick Blinks, personally performed the services described in this documentaiton. All medical record entries made by the scribe were at my direction and in my presence. I have reviewed the chart and agree that the record reflects my personal performance and is accurate and complete  Erick Blinks, MD, 04/08/2015 9:09 AM

## 2015-04-09 LAB — BLOOD GAS, ARTERIAL
Acid-Base Excess: 12.7 mmol/L — ABNORMAL HIGH (ref 0.0–2.0)
Bicarbonate: 34.7 mEq/L — ABNORMAL HIGH (ref 20.0–24.0)
DELIVERY SYSTEMS: POSITIVE
Drawn by: 382351
EXPIRATORY PAP: 5
FIO2: 0.3
Inspiratory PAP: 15
O2 Saturation: 96 %
PCO2 ART: 69.4 mmHg — AB (ref 35.0–45.0)
PH ART: 7.364 (ref 7.350–7.450)
Patient temperature: 37
pO2, Arterial: 82 mmHg (ref 80.0–100.0)

## 2015-04-09 LAB — BASIC METABOLIC PANEL
ANION GAP: 5 (ref 5–15)
BUN: 30 mg/dL — AB (ref 6–20)
CALCIUM: 8.5 mg/dL — AB (ref 8.9–10.3)
CO2: 39 mmol/L — ABNORMAL HIGH (ref 22–32)
Chloride: 98 mmol/L — ABNORMAL LOW (ref 101–111)
Creatinine, Ser: 0.74 mg/dL (ref 0.61–1.24)
GFR calc Af Amer: >60 mL/min (ref >60)
GLUCOSE: 135 mg/dL — AB (ref 65–99)
Potassium: 5.1 mmol/L (ref 3.5–5.1)
SODIUM: 142 mmol/L (ref 135–145)

## 2015-04-09 LAB — CBC
HCT: 39.3 % (ref 39.0–52.0)
HEMOGLOBIN: 11.8 g/dL — AB (ref 13.0–17.0)
MCH: 30.3 pg (ref 26.0–34.0)
MCHC: 30 g/dL (ref 30.0–36.0)
MCV: 101 fL — AB (ref 78.0–100.0)
PLATELETS: 198 10*3/uL (ref 150–400)
RBC: 3.89 MIL/uL — AB (ref 4.22–5.81)
RDW: 13.4 % (ref 11.5–15.5)
WBC: 9.6 10*3/uL (ref 4.0–10.5)

## 2015-04-09 MED ORDER — LORAZEPAM 2 MG/ML IJ SOLN
1.0000 mg | INTRAMUSCULAR | Status: DC | PRN
  Administered 2015-04-09 – 2015-04-11 (×11): 2 mg via INTRAVENOUS
  Filled 2015-04-09 (×11): qty 1

## 2015-04-09 MED ORDER — LORAZEPAM 2 MG/ML IJ SOLN
INTRAMUSCULAR | Status: AC
  Filled 2015-04-09: qty 1

## 2015-04-09 NOTE — Progress Notes (Signed)
[facility] PROGRESS NOTE  Shane Todd [id number]:[id number] DOB: 1945/11/25 DOA: 04/05/2015 PCP: [name], MD  Assessment/Plan: 1. Acute on chronic respiratory failure with hypoxia and hypercarbia. ABG on 2/24 noted for pCO2 elevated at 80.8. Placed on BiPAP with improvement. Repeat ABG this morning reveals pCO2 69.4. Patient reports intolerance with the BIPAP. Will adjust setting and resume. Continue to monitor pCO2.  Discussed with Dr. Juanetta Gosling, patient will need to be on BIPAP qhs once he returns home. This will need to be set up by case management.  2. Metabolic acute encephalopathy, due to CO2 narcosis. Improving with BIPAP therapy.  3. COPD exacerbation. Cotinue nebs, steroids, abx, and pulmonary hygiene. Appreciate pulmonology assistance.  4. CAP, WBC wnl and afebrile. Continue IV Levaquin. 5. Essential HTN, stable. Continue home meds 6. Leukocytosis, likely related to steroids. Resolved.    Code Status: Full DVT prophylaxis: Lovenox Family Communication:   Family at bedside Disposition Plan: Anticipate discharge in 24-48 hours.    Consultants:  Pulmonology  PT -HHPT   Procedures:  none  Antibiotics:  Levquin 2/21 >>  HPI/Subjective: Did not tolerate the BIPAP well. States it felt ike it was compressing his back and chest. Had some SOB overnight and an episode of emesis this morning. Denies any CP now.   Objective: Filed Vitals:   04/09/15 0600 04/09/15 0635  BP: 151/83   Pulse: 95 94  Temp:    Resp: 16 21    Intake/Output Summary (Last 24 hours) at 04/09/15 0733 Last data filed at 04/09/15 0600  Gross per 24 hour  Intake 2882.5 ml  Output    975 ml  Net 1907.5 ml   Filed Weights   04/07/15 0500 04/08/15 0500 04/09/15 0400  Weight: 62.6 kg (138 lb 0.1 oz) 63.8 kg (140 lb 10.5 oz) 65.1 kg (143 lb 8.3 oz)    Exam:  General: NAD, looks comfortable Cardiovascular: RRR, S1, S2  Respiratory: clear bilaterally, No wheezing, rales or  rhonchi Abdomen: soft, non tender, no distention , bowel sounds normal Musculoskeletal: No edema b/l  Data Reviewed: Basic Metabolic Panel:  Recent Labs Lab 04/05/15 1721 04/06/15 0413 04/07/15 0453 04/08/15 0413 04/09/15 0451  NA 140 141 140 142 142  K 4.4 4.0 3.9 5.0 5.1  CL 95* 98* 100* 99* 98*  CO2 38* 34* 34* 39* 39*  GLUCOSE 109* 165* 148* 134* 135*  BUN 55* 55* 42* 33* 30*  CREATININE 1.09 0.94 0.78 0.81 0.74  CALCIUM 8.7* 8.6* 8.4* 8.4* 8.5*   CBC:  Recent Labs Lab 04/05/15 1721 04/06/15 0413 04/07/15 0453 04/08/15 0413 04/09/15 0451  WBC 7.0 5.3 13.1* 11.0* 9.6  NEUTROABS 5.6  --   --   --   --   HGB 13.5 12.2* 12.3* 12.5* 11.8*  HCT 42.8 39.8 38.9* 40.8 39.3  MCV 98.6 98.0 96.8 101.0* 101.0*  PLT 131* 144* 176 204 198   Cardiac Enzymes:  Recent Labs Lab 04/05/15 1721  TROPONINI <0.03   BNP (last 3 results)  Recent Labs  04/05/15 1721  BNP 170.0*     Recent Results (from the past 240 hour(s))  MRSA PCR Screening     Status: None   Collection Time: 04/05/15 10:20 PM  Result Value Ref Range Status   MRSA by PCR NEGATIVE NEGATIVE Final    Comment:        The GeneXpert MRSA Assay (FDA approved for NASAL specimens only), is one component of a comprehensive MRSA colonization surveillance program. It is  not intended to diagnose MRSA infection nor to guide or monitor treatment for MRSA infections.      Studies: Dg Chest Port 1 View  04/08/2015  CLINICAL DATA:  Fever.  COPD. EXAM: PORTABLE CHEST 1 VIEW COMPARISON:  04/05/2015 FINDINGS: Pulmonary hyperinflation and diffuse interstitial prominence appears stable and consistent with COPD. No evidence of pulmonary consolidation or pleural effusion. Heart size and mediastinal contours are within normal limits. IMPRESSION: Stable exam.  COPD.  No active disease. Electronically Signed   By: Myles Rosenthal M.D.   On: 04/08/2015 14:11    Scheduled Meds: . amLODipine  5 mg Oral Daily  . enoxaparin  (LOVENOX) injection  40 mg Subcutaneous Q24H  . guaiFENesin  1,200 mg Oral BID  . ipratropium-albuterol  3 mL Nebulization Q4H  . levofloxacin (LEVAQUIN) IV  750 mg Intravenous Q24H  . lisinopril  20 mg Oral Daily  . methylPREDNISolone (SOLU-MEDROL) injection  80 mg Intravenous Q6H  . mometasone-formoterol  2 puff Inhalation BID  . sodium chloride flush  3 mL Intravenous Q12H  . traZODone  50 mg Oral QHS   Continuous Infusions: . sodium chloride 75 mL/hr at 04/09/15 0600    Active Problems:   Hypertension   Acute respiratory failure with hypoxia and hypercarbia (HCC)   CAP (community acquired pneumonia)   Hypercapnic respiratory failure (HCC)   COPD exacerbation (HCC)   Encephalopathy, metabolic   Time spent: 25 minutes  Erick Blinks, MD. [facility] Pager [phone]. If 7PM-7AM, please contact night-coverage at [email or web address], password [facility] 04/09/2015, 7:33 AM  LOS: 4 days     By signing my name below, I, Shane Todd, attest that this documentation has been prepared under the direction and in the presence of [facility]. MD Electronically Signed: Burnett Todd, Scribe. 04/09/2015 9:44am  I, Dr. Erick Blinks, personally performed the services described in this documentaiton. All medical record entries made by the scribe were at my direction and in my presence. I have reviewed the chart and agree that the record reflects my personal performance and is accurate and complete  Erick Blinks, MD, 04/09/2015 9:53 AM

## 2015-04-09 NOTE — Progress Notes (Signed)
Had to change patient's BIPAP mask. Patient is tolerating new one just fine. RT will continue to monitor.

## 2015-04-09 NOTE — Progress Notes (Signed)
1400 became disoriented pulled off oxygen, clothing and monitoring. Helped to bed and connected monitoring cables back. Due to confusion at this time placed back on Bi-pap. 10 minutes into bipap was removed by patient and had to replace.

## 2015-04-09 NOTE — Progress Notes (Signed)
Subjective: He says he feels better. He had significant problems with hypercapnia yesterday. He's better this morning. He says he wants to go home and I discussed that with him and told him that he is not ready for discharge and that he is very likely to need BiPAP at home.  Objective: Vital signs in last 24 hours: Temp:  [97 F (36.1 C)-98.3 F (36.8 C)] 98.2 F (36.8 C) (02/25 0802) Pulse Rate:  [78-142] 97 (02/25 0800) Resp:  [15-31] 18 (02/25 0800) BP: (105-163)/(48-107) 163/83 mmHg (02/25 0700) SpO2:  [89 %-100 %] 96 % (02/25 0850) FiO2 (%):  [28 %-45 %] 28 % (02/25 0800) Weight:  [65.1 kg (143 lb 8.3 oz)] 65.1 kg (143 lb 8.3 oz) (02/25 0400) Weight change: 1.3 kg (2 lb 13.9 oz) Last BM Date: 04/06/15  Intake/Output from previous day: 02/24 0701 - 02/25 0700 In: 2882.5 [P.O.:960; I.V.:1772.5; IV Piggyback:150] Out: 975 [Urine:975]  PHYSICAL EXAM General appearance: alert, cooperative and mild distress Resp: rhonchi bilaterally Cardio: regular rate and rhythm, S1, S2 normal, no murmur, click, rub or gallop GI: soft, non-tender; bowel sounds normal; no masses,  no organomegaly Extremities: extremities normal, atraumatic, no cyanosis or edema  Lab Results:  Results for orders placed or performed during the hospital encounter of 04/05/15 (from the past 48 hour(s))  Basic metabolic panel     Status: Abnormal   Collection Time: 04/08/15  4:13 AM  Result Value Ref Range   Sodium 142 135 - 145 mmol/L   Potassium 5.0 3.5 - 5.1 mmol/L    Comment: DELTA CHECK NOTED   Chloride 99 (L) 101 - 111 mmol/L   CO2 39 (H) 22 - 32 mmol/L   Glucose, Bld 134 (H) 65 - 99 mg/dL   BUN 33 (H) 6 - 20 mg/dL   Creatinine, Ser 0.81 0.61 - 1.24 mg/dL   Calcium 8.4 (L) 8.9 - 10.3 mg/dL   GFR calc non Af Amer >60 >60 mL/min   GFR calc Af Amer >60 >60 mL/min    Comment: (NOTE) The eGFR has been calculated using the CKD EPI equation. This calculation has not been validated in all clinical  situations. eGFR's persistently <60 mL/min signify possible Chronic Kidney Disease.    Anion gap 4 (L) 5 - 15  CBC     Status: Abnormal   Collection Time: 04/08/15  4:13 AM  Result Value Ref Range   WBC 11.0 (H) 4.0 - 10.5 K/uL   RBC 4.04 (L) 4.22 - 5.81 MIL/uL   Hemoglobin 12.5 (L) 13.0 - 17.0 g/dL   HCT 40.8 39.0 - 52.0 %   MCV 101.0 (H) 78.0 - 100.0 fL   MCH 30.9 26.0 - 34.0 pg   MCHC 30.6 30.0 - 36.0 g/dL   RDW 13.5 11.5 - 15.5 %   Platelets 204 150 - 400 K/uL  Blood gas, arterial     Status: Abnormal   Collection Time: 04/08/15  8:05 AM  Result Value Ref Range   O2 Content 2.5 L/min   Delivery systems NASAL CANNULA    pH, Arterial 7.295 (L) 7.350 - 7.450   pCO2 arterial 80.1 (HH) 35.0 - 45.0 mmHg    Comment: CRITICAL RESULT CALLED TO, READ BACK BY AND VERIFIED WITH: LAWARNCE HILTON,RN AT 0823, BY WENDY VIA,RRT,RCP ON 04/08/2015    pO2, Arterial 143 (H) 80.0 - 100.0 mmHg   Bicarbonate 32.3 (H) 20.0 - 24.0 mEq/L   Acid-Base Excess 11.1 (H) 0.0 - 2.0 mmol/L   O2 Saturation  98.7 %   Collection site LEFT RADIAL    Drawn by [id number]    Sample type ARTERIAL DRAW    Allens test (pass/fail) PASS PASS  Blood gas, arterial     Status: Abnormal   Collection Time: 04/08/15 11:00 AM  Result Value Ref Range   FIO2 0.60    Delivery systems VENTILATOR    Mode BILEVEL POSITIVE AIRWAY PRESSURE    LHR 16 resp/min   Inspiratory PAP 15    Expiratory PAP 5    pH, Arterial 7.307 (L) 7.350 - 7.450   pCO2 arterial 80.8 (HH) 35.0 - 45.0 mmHg    Comment: CRITICAL RESULT CALLED TO, READ BACK BY AND VERIFIED WITH: LAWARENCE HILTON,RN AT 1122 BY WENDY VIA,RRT,RCP ON 02 CRITICAL RESULT CALLED TO, READ BACK BY AND VERIFIED WITH: LAWARENCE HILTON,RN AT 1122 BY WENDY VIA,RRT,RCP ON02/24/2017    pO2, Arterial 165 (H) 80.0 - 100.0 mmHg   Bicarbonate 34.1 (H) 20.0 - 24.0 mEq/L   Acid-Base Excess 12.6 (H) 0.0 - 2.0 mmol/L   O2 Saturation 99.0 %   Collection site LEFT RADIAL    Drawn by [id number]     Sample type ARTERIAL DRAW    Allens test (pass/fail) PASS PASS  CBC     Status: Abnormal   Collection Time: 04/09/15  4:51 AM  Result Value Ref Range   WBC 9.6 4.0 - 10.5 K/uL   RBC 3.89 (L) 4.22 - 5.81 MIL/uL   Hemoglobin 11.8 (L) 13.0 - 17.0 g/dL   HCT 39.3 39.0 - 52.0 %   MCV 101.0 (H) 78.0 - 100.0 fL   MCH 30.3 26.0 - 34.0 pg   MCHC 30.0 30.0 - 36.0 g/dL   RDW 13.4 11.5 - 15.5 %   Platelets 198 150 - 400 K/uL  Basic metabolic panel     Status: Abnormal   Collection Time: 04/09/15  4:51 AM  Result Value Ref Range   Sodium 142 135 - 145 mmol/L   Potassium 5.1 3.5 - 5.1 mmol/L   Chloride 98 (L) 101 - 111 mmol/L   CO2 39 (H) 22 - 32 mmol/L   Glucose, Bld 135 (H) 65 - 99 mg/dL   BUN 30 (H) 6 - 20 mg/dL   Creatinine, Ser 0.74 0.61 - 1.24 mg/dL   Calcium 8.5 (L) 8.9 - 10.3 mg/dL   GFR calc non Af Amer >60 >60 mL/min   GFR calc Af Amer >60 >60 mL/min    Comment: (NOTE) The eGFR has been calculated using the CKD EPI equation. This calculation has not been validated in all clinical situations. eGFR's persistently <60 mL/min signify possible Chronic Kidney Disease.    Anion gap 5 5 - 15  Blood gas, arterial     Status: Abnormal   Collection Time: 04/09/15  4:56 AM  Result Value Ref Range   FIO2 0.30    Delivery systems BILEVEL POSITIVE AIRWAY PRESSURE    Inspiratory PAP 15    Expiratory PAP 5    pH, Arterial 7.364 7.350 - 7.450   pCO2 arterial 69.4 (HH) 35.0 - 45.0 mmHg    Comment: CRITICAL RESULT CALLED TO, READ BACK BY AND VERIFIED WITH: S.HAMMOCK,RN BY B.STOPHEL,RRT,RCP ON 04/09/15 AT 05:04.    pO2, Arterial 82.0 80.0 - 100.0 mmHg   Bicarbonate 34.7 (H) 20.0 - 24.0 mEq/L   Acid-Base Excess 12.7 (H) 0.0 - 2.0 mmol/L   O2 Saturation 96.0 %   Patient temperature 37.0    Collection site RIGHT RADIAL  Drawn by [id number]    Sample type ARTERIAL DRAW    Allens test (pass/fail) PASS PASS    ABGS  Recent Labs  04/09/15 0456  PHART 7.364  PO2ART 82.0  HCO3 34.7*    CULTURES Recent Results (from the past 240 hour(s))  MRSA PCR Screening     Status: None   Collection Time: 04/05/15 10:20 PM  Result Value Ref Range Status   MRSA by PCR NEGATIVE NEGATIVE Final    Comment:        The GeneXpert MRSA Assay (FDA approved for NASAL specimens only), is one component of a comprehensive MRSA colonization surveillance program. It is not intended to diagnose MRSA infection nor to guide or monitor treatment for MRSA infections.    Studies/Results: Dg Chest Port 1 View  04/08/2015  CLINICAL DATA:  Fever.  COPD. EXAM: PORTABLE CHEST 1 VIEW COMPARISON:  04/05/2015 FINDINGS: Pulmonary hyperinflation and diffuse interstitial prominence appears stable and consistent with COPD. No evidence of pulmonary consolidation or pleural effusion. Heart size and mediastinal contours are within normal limits. IMPRESSION: Stable exam.  COPD.  No active disease. Electronically Signed   By: Earle Gell M.D.   On: 04/08/2015 14:11    Medications:  Prior to Admission:  Prescriptions prior to admission  Medication Sig Dispense Refill Last Dose  . albuterol (PROVENTIL HFA;VENTOLIN HFA) 108 (90 BASE) MCG/ACT inhaler Inhale 2 puffs into the lungs every 4 (four) hours as needed for wheezing or shortness of breath. For shortness of breath   unknown  . amLODipine (NORVASC) 5 MG tablet Take 5 mg by mouth every morning.   04/05/2015 at Unknown time  . fluticasone furoate-vilanterol (BREO ELLIPTA) 100-25 MCG/INH AEPB Inhale 1 puff into the lungs daily.   04/05/2015 at Unknown time  . lisinopril (PRINIVIL,ZESTRIL) 20 MG tablet Take 20 mg by mouth every morning.    04/05/2015 at Unknown time  . mometasone-formoterol (DULERA) 100-5 MCG/ACT AERO Inhale 2 puffs into the lungs 2 (two) times daily. (Patient taking differently: Inhale 1 puff into the lungs 2 (two) times daily. ) 1 Inhaler 0 04/05/2015 at Unknown time  . traZODone (DESYREL) 100 MG tablet Take 100 mg by mouth at bedtime.   04/04/2015 at  Unknown time  . umeclidinium-vilanterol (ANORO ELLIPTA) 62.5-25 MCG/INH AEPB Inhale 1 puff into the lungs 2 (two) times daily.   04/05/2015 at Unknown time   Scheduled: . amLODipine  5 mg Oral Daily  . enoxaparin (LOVENOX) injection  40 mg Subcutaneous Q24H  . guaiFENesin  1,200 mg Oral BID  . ipratropium-albuterol  3 mL Nebulization Q4H  . levofloxacin (LEVAQUIN) IV  750 mg Intravenous Q24H  . lisinopril  20 mg Oral Daily  . methylPREDNISolone (SOLU-MEDROL) injection  80 mg Intravenous Q6H  . mometasone-formoterol  2 puff Inhalation BID  . sodium chloride flush  3 mL Intravenous Q12H  . traZODone  50 mg Oral QHS   Continuous: . sodium chloride 1,000 mL (04/09/15 0822)   ALP:FXTKWI chloride, acetaminophen **OR** acetaminophen, alum & mag hydroxide-simeth, morphine injection, ondansetron **OR** ondansetron (ZOFRAN) IV, sodium chloride flush  Assesment: He was admitted with acute hypoxic hypercapnic respiratory failure, community-acquired pneumonia and COPD exacerbation. He's better this morning but as mentioned above I think he is probably going to require BiPAP at home. Active Problems:   Hypertension   Acute respiratory failure with hypoxia and hypercarbia (HCC)   CAP (community acquired pneumonia)   Hypercapnic respiratory failure (HCC)   COPD exacerbation (HCC)   Encephalopathy, metabolic  Plan: Continue current treatments. Discussed with hospitalist attending and I would keep him in the ICU at least today just to make sure since she's had so much variability in his symptoms    LOS: 4 days   Shamon Lobo L 04/09/2015, 9:44 AM

## 2015-04-10 LAB — BLOOD GAS, ARTERIAL
ACID-BASE EXCESS: 15 mmol/L — AB (ref 0.0–2.0)
Bicarbonate: 36.8 mEq/L — ABNORMAL HIGH (ref 20.0–24.0)
Delivery systems: POSITIVE
Drawn by: 234301
EXPIRATORY PAP: 6
FIO2: 30
INSPIRATORY PAP: 18
MODE: POSITIVE
O2 SAT: 97 %
PCO2 ART: 72.4 mmHg — AB (ref 35.0–45.0)
PO2 ART: 92.8 mmHg (ref 80.0–100.0)
pH, Arterial: 7.371 (ref 7.350–7.450)

## 2015-04-10 MED ORDER — MORPHINE SULFATE (PF) 4 MG/ML IV SOLN
4.0000 mg | INTRAVENOUS | Status: DC | PRN
  Administered 2015-04-10 – 2015-04-17 (×13): 4 mg via INTRAVENOUS
  Filled 2015-04-10 (×13): qty 1

## 2015-04-10 MED ORDER — METHYLPREDNISOLONE SODIUM SUCC 40 MG IJ SOLR
40.0000 mg | Freq: Four times a day (QID) | INTRAMUSCULAR | Status: DC
  Administered 2015-04-10 – 2015-04-13 (×12): 40 mg via INTRAVENOUS
  Filled 2015-04-10 (×12): qty 1

## 2015-04-10 NOTE — Progress Notes (Signed)
[facility] PROGRESS NOTE  Shane Todd:[id number] DOB: [birth year] DOA: 04/05/2015 PCP: [name], MD  Assessment/Plan: 1. Acute on chronic respiratory failure with hypoxia and hypercarbia. ABG improving with BIPAP however patient remains agitated. Will continue to monitor pCO2.  Discussed with Dr. Juanetta Gosling, patient will need to be on BIPAP qhs once he returns home. This will need to be set up by case management.  2. Metabolic acute encephalopathy, due to CO2 narcosis. Patient remains confused and agitated, requiring frequent doses of Ativan. Etiologies include possible elevated pCO2 as well as high dose steroids. Will decrease steroid dosing. Will continue BIPAP therapy and monitor.  3. COPD exacerbation. Cotinue nebs, steroids, abx, and pulmonary hygiene. Appreciate pulmonology assistance.  4. CAP, WBC wnl and afebrile. Continue IV Levaquin. 5. Essential HTN, stable. Continue home meds 6. Leukocytosis, likely related to steroids. Resolved.    Code Status: Full DVT prophylaxis: Lovenox Family Communication:   Family at bedside Disposition Plan: Anticipate discharge in 24-48 hours.    Consultants:  Pulmonology  PT -HHPT   Procedures:  none  Antibiotics:  Levquin 2/21 >>  HPI/Subjective: Per family at bedside, when the medicine wears off, the patient becomes confused and agitated. He also starts talking out of his head and is seeing things. Family denies any alcohol or drug abuse. States that the patient typically gets like this when he comes to the hospital and his pCO2 is up.   Patient states he is doing all right and his breathing is better. Hx is limited secondary to his current clinical condition.  Objective: Filed Vitals:   04/10/15 0500 04/10/15 0600  BP: 155/82 148/75  Pulse: 100 84  Temp:    Resp: 18 16    Intake/Output Summary (Last 24 hours) at 04/10/15 0732 Last data filed at 04/10/15 0600  Gross per 24 hour  Intake   2610 ml   Output    650 ml  Net   1960 ml   Filed Weights   04/07/15 0500 04/08/15 0500 04/09/15 0400  Weight: 62.6 kg (138 lb 0.1 oz) 63.8 kg (140 lb 10.5 oz) 65.1 kg (143 lb 8.3 oz)    Exam:  General: appears confused and agitated Cardiovascular: RRR, S1, S2  Respiratory: Diminished breath sounds with mild wheeze bilaterally.  Abdomen: soft, non tender, no distention , bowel sounds normal Musculoskeletal: No edema b/l  Data Reviewed: Basic Metabolic Panel:  Recent Labs Lab 04/05/15 1721 04/06/15 0413 04/07/15 0453 04/08/15 0413 04/09/15 0451  NA 140 141 140 142 142  K 4.4 4.0 3.9 5.0 5.1  CL 95* 98* 100* 99* 98*  CO2 38* 34* 34* 39* 39*  GLUCOSE 109* 165* 148* 134* 135*  BUN 55* 55* 42* 33* 30*  CREATININE 1.09 0.94 0.78 0.81 0.74  CALCIUM 8.7* 8.6* 8.4* 8.4* 8.5*   CBC:  Recent Labs Lab 04/05/15 1721 04/06/15 0413 04/07/15 0453 04/08/15 0413 04/09/15 0451  WBC 7.0 5.3 13.1* 11.0* 9.6  NEUTROABS 5.6  --   --   --   --   HGB 13.5 12.2* 12.3* 12.5* 11.8*  HCT 42.8 39.8 38.9* 40.8 39.3  MCV 98.6 98.0 96.8 101.0* 101.0*  PLT 131* 144* 176 204 198   Cardiac Enzymes:  Recent Labs Lab 04/05/15 1721  TROPONINI <0.03   BNP (last 3 results)  Recent Labs  04/05/15 1721  BNP 170.0*     Recent Results (from the past 240 hour(s))  MRSA PCR Screening     Status: None  Collection Time: 04/05/15 10:20 PM  Result Value Ref Range Status   MRSA by PCR NEGATIVE NEGATIVE Final    Comment:        The GeneXpert MRSA Assay (FDA approved for NASAL specimens only), is one component of a comprehensive MRSA colonization surveillance program. It is not intended to diagnose MRSA infection nor to guide or monitor treatment for MRSA infections.      Studies: Dg Chest Port 1 View  04/08/2015  CLINICAL DATA:  Fever.  COPD. EXAM: PORTABLE CHEST 1 VIEW COMPARISON:  04/05/2015 FINDINGS: Pulmonary hyperinflation and diffuse interstitial prominence appears stable and  consistent with COPD. No evidence of pulmonary consolidation or pleural effusion. Heart size and mediastinal contours are within normal limits. IMPRESSION: Stable exam.  COPD.  No active disease. Electronically Signed   By: Myles Rosenthal M.D.   On: 04/08/2015 14:11    Scheduled Meds: . amLODipine  5 mg Oral Daily  . enoxaparin (LOVENOX) injection  40 mg Subcutaneous Q24H  . guaiFENesin  1,200 mg Oral BID  . ipratropium-albuterol  3 mL Nebulization Q4H  . levofloxacin (LEVAQUIN) IV  750 mg Intravenous Q24H  . lisinopril  20 mg Oral Daily  . methylPREDNISolone (SOLU-MEDROL) injection  80 mg Intravenous Q6H  . mometasone-formoterol  2 puff Inhalation BID  . sodium chloride flush  3 mL Intravenous Q12H  . traZODone  50 mg Oral QHS   Continuous Infusions: . sodium chloride 75 mL/hr at 04/10/15 0600    Active Problems:   Hypertension   Acute respiratory failure with hypoxia and hypercarbia (HCC)   CAP (community acquired pneumonia)   Hypercapnic respiratory failure (HCC)   COPD exacerbation (HCC)   Encephalopathy, metabolic   Time spent: 25 minutes  Erick Blinks, MD. [facility] Pager [phone]. If 7PM-7AM, please contact night-coverage at [email or web address], password [facility] 04/10/2015, 7:32 AM  LOS: 5 days     By signing my name below, I, [name], attest that this documentation has been prepared under the direction and in the presence of [facility]. MD Electronically Signed: [name], Scribe. 04/10/2015  8:55am  I, Dr. Erick Blinks, personally performed the services described in this documentaiton. All medical record entries made by the scribe were at my direction and in my presence. I have reviewed the chart and agree that the record reflects my personal performance and is accurate and complete  Erick Blinks, MD, 04/10/2015 9:04 AM

## 2015-04-10 NOTE — Progress Notes (Signed)
Subjective: He is still agitated and on BiPAP. Blood gas this morning is about the same as yesterday.  Objective: Vital signs in last 24 hours: Temp:  [96.8 F (36 C)-98 F (36.7 C)] 98 F (36.7 C) (02/26 0811) Pulse Rate:  [81-112] 106 (02/26 0852) Resp:  [12-35] 19 (02/26 0852) BP: (110-174)/(49-119) 148/75 mmHg (02/26 0600) SpO2:  [82 %-100 %] 96 % (02/26 0851) FiO2 (%):  [30 %-40 %] 30 % (02/26 0851) Weight change:  Last BM Date: 04/08/15  Intake/Output from previous day: 02/25 0701 - 02/26 0700 In: 2610 [P.O.:660; I.V.:1800; IV Piggyback:150] Out: 650 [Urine:650]  PHYSICAL EXAM General appearance: alert, cooperative and moderate distress Resp: rhonchi bilaterally Cardio: regular rate and rhythm, S1, S2 normal, no murmur, click, rub or gallop GI: soft, non-tender; bowel sounds normal; no masses,  no organomegaly Extremities: extremities normal, atraumatic, no cyanosis or edema  Lab Results:  Results for orders placed or performed during the hospital encounter of 04/05/15 (from the past 48 hour(s))  Blood gas, arterial     Status: Abnormal   Collection Time: 04/08/15 11:00 AM  Result Value Ref Range   FIO2 0.60    Delivery systems VENTILATOR    Mode BILEVEL POSITIVE AIRWAY PRESSURE    LHR 16 resp/min   Inspiratory PAP 15    Expiratory PAP 5    pH, Arterial 7.307 (Todd) 7.350 - 7.450   pCO2 arterial 80.8 (HH) 35.0 - 45.0 mmHg    Comment: CRITICAL RESULT CALLED TO, READ BACK BY AND VERIFIED WITH: LAWARENCE HILTON,RN AT 1122 BY WENDY VIA,RRT,RCP ON 02 CRITICAL RESULT CALLED TO, READ BACK BY AND VERIFIED WITH: LAWARENCE HILTON,RN AT 1122 BY WENDY VIA,RRT,RCP ON02/24/2017    pO2, Arterial 165 (H) 80.0 - 100.0 mmHg   Bicarbonate 34.1 (H) 20.0 - 24.0 mEq/Todd   Acid-Base Excess 12.6 (H) 0.0 - 2.0 mmol/Todd   O2 Saturation 99.0 %   Collection site LEFT RADIAL    Drawn by [id number]    Sample type ARTERIAL DRAW    Allens test (pass/fail) PASS PASS  CBC     Status: Abnormal   Collection Time: 04/09/15  4:51 AM  Result Value Ref Range   WBC 9.6 4.0 - 10.5 K/uL   RBC 3.89 (Todd) 4.22 - 5.81 MIL/uL   Hemoglobin 11.8 (Todd) 13.0 - 17.0 g/dL   HCT 39.3 39.0 - 52.0 %   MCV 101.0 (H) 78.0 - 100.0 fL   MCH 30.3 26.0 - 34.0 pg   MCHC 30.0 30.0 - 36.0 g/dL   RDW 13.4 11.5 - 15.5 %   Platelets 198 150 - 400 K/uL  Basic metabolic panel     Status: Abnormal   Collection Time: 04/09/15  4:51 AM  Result Value Ref Range   Sodium 142 135 - 145 mmol/Todd   Potassium 5.1 3.5 - 5.1 mmol/Todd   Chloride 98 (Todd) 101 - 111 mmol/Todd   CO2 39 (H) 22 - 32 mmol/Todd   Glucose, Bld 135 (H) 65 - 99 mg/dL   BUN 30 (H) 6 - 20 mg/dL   Creatinine, Ser 0.74 0.61 - 1.24 mg/dL   Calcium 8.5 (Todd) 8.9 - 10.3 mg/dL   GFR calc non Af Amer >60 >60 mL/min   GFR calc Af Amer >60 >60 mL/min    Comment: (NOTE) The eGFR has been calculated using the CKD EPI equation. This calculation has not been validated in all clinical situations. eGFR's persistently <60 mL/min signify possible Chronic Kidney Disease.    Anion  gap 5 5 - 15  Blood gas, arterial     Status: Abnormal   Collection Time: 04/09/15  4:56 AM  Result Value Ref Range   FIO2 0.30    Delivery systems BILEVEL POSITIVE AIRWAY PRESSURE    Inspiratory PAP 15    Expiratory PAP 5    pH, Arterial 7.364 7.350 - 7.450   pCO2 arterial 69.4 (HH) 35.0 - 45.0 mmHg    Comment: CRITICAL RESULT CALLED TO, READ BACK BY AND VERIFIED WITH: S.HAMMOCK,RN BY B.STOPHEL,RRT,RCP ON 04/09/15 AT 05:04.    pO2, Arterial 82.0 80.0 - 100.0 mmHg   Bicarbonate 34.7 (H) 20.0 - 24.0 mEq/Todd   Acid-Base Excess 12.7 (H) 0.0 - 2.0 mmol/Todd   O2 Saturation 96.0 %   Patient temperature 37.0    Collection site RIGHT RADIAL    Drawn by [id number]    Sample type ARTERIAL DRAW    Allens test (pass/fail) PASS PASS    ABGS  Recent Labs  04/09/15 0456  PHART 7.364  PO2ART 82.0  HCO3 34.7*   CULTURES Recent Results (from the past 240 hour(s))  MRSA PCR Screening     Status: None    Collection Time: 04/05/15 10:20 PM  Result Value Ref Range Status   MRSA by PCR NEGATIVE NEGATIVE Final    Comment:        The GeneXpert MRSA Assay (FDA approved for NASAL specimens only), is one component of a comprehensive MRSA colonization surveillance program. It is not intended to diagnose MRSA infection nor to guide or monitor treatment for MRSA infections.    Studies/Results: Dg Chest Port 1 View  04/08/2015  CLINICAL DATA:  Fever.  COPD. EXAM: PORTABLE CHEST 1 VIEW COMPARISON:  04/05/2015 FINDINGS: Pulmonary hyperinflation and diffuse interstitial prominence appears stable and consistent with COPD. No evidence of pulmonary consolidation or pleural effusion. Heart size and mediastinal contours are within normal limits. IMPRESSION: Stable exam.  COPD.  No active disease. Electronically Signed   By: Earle Gell M.D.   On: 04/08/2015 14:11    Medications:  Prior to Admission:  Prescriptions prior to admission  Medication Sig Dispense Refill Last Dose  . albuterol (PROVENTIL HFA;VENTOLIN HFA) 108 (90 BASE) MCG/ACT inhaler Inhale 2 puffs into the lungs every 4 (four) hours as needed for wheezing or shortness of breath. For shortness of breath   unknown  . amLODipine (NORVASC) 5 MG tablet Take 5 mg by mouth every morning.   04/05/2015 at Unknown time  . fluticasone furoate-vilanterol (BREO ELLIPTA) 100-25 MCG/INH AEPB Inhale 1 puff into the lungs daily.   04/05/2015 at Unknown time  . lisinopril (PRINIVIL,ZESTRIL) 20 MG tablet Take 20 mg by mouth every morning.    04/05/2015 at Unknown time  . mometasone-formoterol (DULERA) 100-5 MCG/ACT AERO Inhale 2 puffs into the lungs 2 (two) times daily. (Patient taking differently: Inhale 1 puff into the lungs 2 (two) times daily. ) 1 Inhaler 0 04/05/2015 at Unknown time  . traZODone (DESYREL) 100 MG tablet Take 100 mg by mouth at bedtime.   04/04/2015 at Unknown time  . umeclidinium-vilanterol (ANORO ELLIPTA) 62.5-25 MCG/INH AEPB Inhale 1 puff into  the lungs 2 (two) times daily.   04/05/2015 at Unknown time   Scheduled: . amLODipine  5 mg Oral Daily  . enoxaparin (LOVENOX) injection  40 mg Subcutaneous Q24H  . guaiFENesin  1,200 mg Oral BID  . ipratropium-albuterol  3 mL Nebulization Q4H  . levofloxacin (LEVAQUIN) IV  750 mg Intravenous Q24H  . lisinopril  20 mg Oral Daily  . methylPREDNISolone (SOLU-MEDROL) injection  40 mg Intravenous Q6H  . mometasone-formoterol  2 puff Inhalation BID  . sodium chloride flush  3 mL Intravenous Q12H  . traZODone  50 mg Oral QHS   Continuous: . sodium chloride 75 mL/hr at 04/10/15 0600   BDG:REUXBP chloride, acetaminophen **OR** acetaminophen, alum & mag hydroxide-simeth, LORazepam, morphine injection, ondansetron **OR** ondansetron (ZOFRAN) IV, sodium chloride flush  Assesment: He has acute on chronic hypoxic/hypercapnic respiratory failure. He is markedly encephalopathic. He has community-acquired pneumonia. He is having continued issues and is back on BiPAP. I think he is close to needing to be intubated and placed on mechanical ventilation and I discussed this with his family at bedside Active Problems:   Hypertension   Acute respiratory failure with hypoxia and hypercarbia (HCC)   CAP (community acquired pneumonia)   Hypercapnic respiratory failure (HCC)   COPD exacerbation (HCC)   Encephalopathy, metabolic    Plan: I would try another day on BiPAP and was clinically deteriorates. Recheck blood gases in the morning and see how he is doing with his encephalopathy.    LOS: 5 days   Shane Todd 04/10/2015, 9:06 AM

## 2015-04-11 ENCOUNTER — Inpatient Hospital Stay (HOSPITAL_COMMUNITY): Payer: Medicare Other

## 2015-04-11 LAB — CBC
HCT: 40.2 % (ref 39.0–52.0)
Hemoglobin: 12.7 g/dL — ABNORMAL LOW (ref 13.0–17.0)
MCH: 30.6 pg (ref 26.0–34.0)
MCHC: 31.6 g/dL (ref 30.0–36.0)
MCV: 96.9 fL (ref 78.0–100.0)
PLATELETS: 218 10*3/uL (ref 150–400)
RBC: 4.15 MIL/uL — AB (ref 4.22–5.81)
RDW: 13.2 % (ref 11.5–15.5)
WBC: 8.3 10*3/uL (ref 4.0–10.5)

## 2015-04-11 LAB — BLOOD GAS, ARTERIAL
ACID-BASE EXCESS: 12.6 mmol/L — AB (ref 0.0–2.0)
Acid-Base Excess: 14 mmol/L — ABNORMAL HIGH (ref 0.0–2.0)
Acid-Base Excess: 13.6 mmol/L — ABNORMAL HIGH (ref 0.0–2.0)
BICARBONATE: 36.1 meq/L — AB (ref 20.0–24.0)
Bicarbonate: 35.6 mEq/L — ABNORMAL HIGH (ref 20.0–24.0)
Bicarbonate: 34.5 mEq/L — ABNORMAL HIGH (ref 20.0–24.0)
DELIVERY SYSTEMS: POSITIVE
DRAWN BY: 317771
DRAWN BY: 25788
DRAWN BY: 317771
EXPIRATORY PAP: 6
FIO2: 0.3
Inspiratory PAP: 18
O2 CONTENT: 2 L/min
O2 CONTENT: 3 L/min
O2 SAT: 96.2 %
O2 Saturation: 97.9 %
O2 Saturation: 91.7 %
PH ART: 7.407 (ref 7.350–7.450)
PH ART: 7.386 (ref 7.350–7.450)
PO2 ART: 83.7 mmHg (ref 80.0–100.0)
RATE: 15 resp/min
TCO2: 16.9 mmol/L (ref 0–100)
TCO2: 18.1 mmol/L (ref 0–100)
pCO2 arterial: 63.9 mmHg (ref 35.0–45.0)
pCO2 arterial: 67.1 mmHg (ref 35.0–45.0)
pCO2 arterial: 58.9 mmHg (ref 35.0–45.0)
pH, Arterial: 7.422 (ref 7.350–7.450)
pO2, Arterial: 112 mmHg — ABNORMAL HIGH (ref 80.0–100.0)
pO2, Arterial: 62.6 mmHg — ABNORMAL LOW (ref 80.0–100.0)

## 2015-04-11 LAB — BASIC METABOLIC PANEL
Anion gap: 6 (ref 5–15)
BUN: 26 mg/dL — ABNORMAL HIGH (ref 6–20)
CALCIUM: 8.1 mg/dL — AB (ref 8.9–10.3)
CHLORIDE: 94 mmol/L — AB (ref 101–111)
CO2: 38 mmol/L — ABNORMAL HIGH (ref 22–32)
CREATININE: 0.61 mg/dL (ref 0.61–1.24)
GFR calc non Af Amer: >60 mL/min (ref >60)
Glucose, Bld: 108 mg/dL — ABNORMAL HIGH (ref 65–99)
Potassium: 4.9 mmol/L (ref 3.5–5.1)
SODIUM: 138 mmol/L (ref 135–145)

## 2015-04-11 MED ORDER — HALOPERIDOL LACTATE 5 MG/ML IJ SOLN: 1.0000 mg | INTRAMUSCULAR | Status: DC | PRN

## 2015-04-11 MED ORDER — LORAZEPAM 2 MG/ML IJ SOLN
1.0000 mg | INTRAMUSCULAR | Status: DC | PRN
  Administered 2015-04-11 – 2015-04-13 (×9): 2 mg via INTRAVENOUS
  Filled 2015-04-11 (×9): qty 1

## 2015-04-11 MED ORDER — ALBUTEROL SULFATE (2.5 MG/3ML) 0.083% IN NEBU
2.5000 mg | INHALATION_SOLUTION | RESPIRATORY_TRACT | Status: DC | PRN
  Administered 2015-04-13: 2.5 mg via RESPIRATORY_TRACT
  Filled 2015-04-11 (×2): qty 3

## 2015-04-11 MED ORDER — ALBUTEROL SULFATE (2.5 MG/3ML) 0.083% IN NEBU
INHALATION_SOLUTION | RESPIRATORY_TRACT | Status: AC
  Administered 2015-04-11: 2.5 mg
  Filled 2015-04-11: qty 3

## 2015-04-11 MED ORDER — HALOPERIDOL LACTATE 5 MG/ML IJ SOLN
1.0000 mg | INTRAMUSCULAR | Status: DC | PRN
  Administered 2015-04-11 – 2015-04-16 (×21): 1 mg via INTRAVENOUS
  Filled 2015-04-11 (×21): qty 1

## 2015-04-11 NOTE — Progress Notes (Signed)
Subjective: He may be a little bit better this morning. He is off BiPAP now. His PCO2 had improved. He has been sedated through most of the night.  Objective: Vital signs in last 24 hours: Temp:  [96.9 F (36.1 C)-98 F (36.7 C)] 97 F (36.1 C) (02/27 0400) Pulse Rate:  [91-106] 103 (02/27 0800) Resp:  [13-24] 24 (02/27 0800) BP: (134-178)/(77-111) 174/94 mmHg (02/27 0800) SpO2:  [96 %-98 %] 97 % (02/27 0800) FiO2 (%):  [30 %] 30 % (02/26 2338) Weight:  [66.6 kg (146 lb 13.2 oz)] 66.6 kg (146 lb 13.2 oz) (02/27 0500) Weight change:  Last BM Date: 04/08/15  Intake/Output from previous day: 02/26 0701 - 02/27 0700 In: 1725 [I.V.:1725] Out: 2525 [Urine:2525]  PHYSICAL EXAM General appearance: He is on nasal oxygen. He is still confused and sedated Resp: He still has rhonchi but is moving air better Cardio: regular rate and rhythm, S1, S2 normal, no murmur, click, rub or gallop GI: soft, non-tender; bowel sounds normal; no masses,  no organomegaly Extremities: extremities normal, atraumatic, no cyanosis or edema  Lab Results:  Results for orders placed or performed during the hospital encounter of 04/05/15 (from the past 48 hour(s))  Blood gas, arterial     Status: Abnormal   Collection Time: 04/10/15  9:00 AM  Result Value Ref Range   FIO2 30.00    Delivery systems BILEVEL POSITIVE AIRWAY PRESSURE    Mode BILEVEL POSITIVE AIRWAY PRESSURE    Inspiratory PAP 18    Expiratory PAP 6    pH, Arterial 7.371 7.350 - 7.450   pCO2 arterial 72.4 (HH) 35.0 - 45.0 mmHg    Comment: CRITICAL RESULT CALLED TO, READ BACK BY AND VERIFIED WITH: HYLTON,L.RN AT 0910 04/09/14 BY BROADNAX,L.RRT    pO2, Arterial 92.8 80.0 - 100.0 mmHg   Bicarbonate 36.8 (H) 20.0 - 24.0 mEq/L   Acid-Base Excess 15.0 (H) 0.0 - 2.0 mmol/L   O2 Saturation 97.0 %   Collection site RIGHT RADIAL    Drawn by [id number]    Sample type ARTERIAL    Allens test (pass/fail) PASS PASS  Blood gas, arterial     Status:  Abnormal   Collection Time: 04/11/15  4:09 AM  Result Value Ref Range   FIO2 0.30    Delivery systems BILEVEL POSITIVE AIRWAY PRESSURE    LHR 15.0 resp/min   Inspiratory PAP 18.0    Expiratory PAP 6.0    pH, Arterial 7.407 7.350 - 7.450   pCO2 arterial 63.9 (HH) 35.0 - 45.0 mmHg    Comment: CRITICAL RESULT CALLED TO, READ BACK BY AND VERIFIED WITH: JAMES DANIEL BY BFRETWELL RRT,RCP ON 04/11/15 AT 0420    pO2, Arterial 83.7 80.0 - 100.0 mmHg   Bicarbonate 36.1 (H) 20.0 - 24.0 mEq/L   TCO2 16.9 0 - 100 mmol/L   Acid-Base Excess 14.0 (H) 0.0 - 2.0 mmol/L   O2 Saturation 96.2 %   Collection site RIGHT RADIAL    Drawn by [id number]    Sample type ARTERIAL DRAW    Allens test (pass/fail) PASS PASS  Basic metabolic panel     Status: Abnormal   Collection Time: 04/11/15  5:03 AM  Result Value Ref Range   Sodium 138 135 - 145 mmol/L   Potassium 4.9 3.5 - 5.1 mmol/L   Chloride 94 (L) 101 - 111 mmol/L   CO2 38 (H) 22 - 32 mmol/L   Glucose, Bld 108 (H) 65 - 99 mg/dL   BUN 26 (  H) 6 - 20 mg/dL   Creatinine, Ser 0.61 0.61 - 1.24 mg/dL   Calcium 8.1 (L) 8.9 - 10.3 mg/dL   GFR calc non Af Amer >60 >60 mL/min   GFR calc Af Amer >60 >60 mL/min    Comment: (NOTE) The eGFR has been calculated using the CKD EPI equation. This calculation has not been validated in all clinical situations. eGFR's persistently <60 mL/min signify possible Chronic Kidney Disease.    Anion gap 6 5 - 15  CBC     Status: Abnormal   Collection Time: 04/11/15  5:03 AM  Result Value Ref Range   WBC 8.3 4.0 - 10.5 K/uL   RBC 4.15 (L) 4.22 - 5.81 MIL/uL   Hemoglobin 12.7 (L) 13.0 - 17.0 g/dL   HCT 40.2 39.0 - 52.0 %   MCV 96.9 78.0 - 100.0 fL   MCH 30.6 26.0 - 34.0 pg   MCHC 31.6 30.0 - 36.0 g/dL   RDW 13.2 11.5 - 15.5 %   Platelets 218 150 - 400 K/uL    ABGS  Recent Labs  04/11/15 0409  PHART 7.407  PO2ART 83.7  TCO2 16.9  HCO3 36.1*   CULTURES Recent Results (from the past 240 hour(s))  MRSA PCR  Screening     Status: None   Collection Time: 04/05/15 10:20 PM  Result Value Ref Range Status   MRSA by PCR NEGATIVE NEGATIVE Final    Comment:        The GeneXpert MRSA Assay (FDA approved for NASAL specimens only), is one component of a comprehensive MRSA colonization surveillance program. It is not intended to diagnose MRSA infection nor to guide or monitor treatment for MRSA infections.    Studies/Results: No results found.  Medications:  Prior to Admission:  Prescriptions prior to admission  Medication Sig Dispense Refill Last Dose  . albuterol (PROVENTIL HFA;VENTOLIN HFA) 108 (90 BASE) MCG/ACT inhaler Inhale 2 puffs into the lungs every 4 (four) hours as needed for wheezing or shortness of breath. For shortness of breath   unknown  . amLODipine (NORVASC) 5 MG tablet Take 5 mg by mouth every morning.   04/05/2015 at Unknown time  . fluticasone furoate-vilanterol (BREO ELLIPTA) 100-25 MCG/INH AEPB Inhale 1 puff into the lungs daily.   04/05/2015 at Unknown time  . lisinopril (PRINIVIL,ZESTRIL) 20 MG tablet Take 20 mg by mouth every morning.    04/05/2015 at Unknown time  . mometasone-formoterol (DULERA) 100-5 MCG/ACT AERO Inhale 2 puffs into the lungs 2 (two) times daily. (Patient taking differently: Inhale 1 puff into the lungs 2 (two) times daily. ) 1 Inhaler 0 04/05/2015 at Unknown time  . traZODone (DESYREL) 100 MG tablet Take 100 mg by mouth at bedtime.   04/04/2015 at Unknown time  . umeclidinium-vilanterol (ANORO ELLIPTA) 62.5-25 MCG/INH AEPB Inhale 1 puff into the lungs 2 (two) times daily.   04/05/2015 at Unknown time   Scheduled: . amLODipine  5 mg Oral Daily  . enoxaparin (LOVENOX) injection  40 mg Subcutaneous Q24H  . guaiFENesin  1,200 mg Oral BID  . ipratropium-albuterol  3 mL Nebulization Q4H  . levofloxacin (LEVAQUIN) IV  750 mg Intravenous Q24H  . lisinopril  20 mg Oral Daily  . methylPREDNISolone (SOLU-MEDROL) injection  40 mg Intravenous Q6H  .  mometasone-formoterol  2 puff Inhalation BID  . sodium chloride flush  3 mL Intravenous Q12H  . traZODone  50 mg Oral QHS   Continuous: . sodium chloride 75 mL/hr at 04/11/15 0500  TTS:VXBLTJ chloride, acetaminophen **OR** acetaminophen, alum & mag hydroxide-simeth, LORazepam, morphine injection, ondansetron **OR** ondansetron (ZOFRAN) IV, sodium chloride flush  Assesment: He has acute hypoxic/hypercapnic respiratory failure. He has COPD at baseline. He has community-acquired pneumonia and is on appropriate treatment. He has been waxing and waning as far as his improvement is concerned. He has been on BiPAP for about the last 48 hours requiring pretty high pressures to maintain his PCO2. He is still high risk of needing intubation and mechanical ventilation. Active Problems:   Hypertension   Acute respiratory failure with hypoxia and hypercarbia (HCC)   CAP (community acquired pneumonia)   Hypercapnic respiratory failure (HCC)   COPD exacerbation (HCC)   Encephalopathy, metabolic    Plan: Try off BiPAP. Arterial blood gas about 2 hours after he comes off his long as he does well clinically. Repeat chest x-ray today.    LOS: 6 days   Othon Guardia L 04/11/2015, 8:14 AM

## 2015-04-11 NOTE — Progress Notes (Signed)
Patient restless difficulty noted breathing. Patient attempting  to climb out of bed. Medicated with Ativan.

## 2015-04-11 NOTE — Progress Notes (Signed)
Patient less restless family at bedside.

## 2015-04-11 NOTE — Progress Notes (Signed)
eLink Physician-Brief Progress Note Patient Name: Shane Todd DOB: 06-17-1945 MRN: [id number]   Date of Service  04/11/2015  HPI/Events of Note  Delirium/Agitation - nurse states that patient behaves like this every night.   eICU Interventions  Will order:  1. Haldol 1 mg IV Q 3 hours PRN agitation or delirium.  2. Monitor QTc interval Q 6 hours.      Intervention Category Major Interventions: Delirium, psychosis, severe agitation - evaluation and management  Sommer,Steven Eugene 04/11/2015, 9:00 PM

## 2015-04-11 NOTE — Progress Notes (Signed)
[facility] PROGRESS NOTE  CALISTRO RAUF AVW:[id number] DOB: January 03, 1946 DOA: 04/05/2015 PCP: [name], MD  Assessment/Plan: 1. Acute on chronic respiratory failure with hypoxia and hypercarbia. ABG continues to improve with BIPAP however patient remains agitated. Will continue to monitor pCO2.  Discussed with Dr. Juanetta Gosling, patient will need to be on BIPAP qhs once he returns home. This will need to be set up by case management.  2. Metabolic acute encephalopathy Likely related to elevated pCO2. Patient continues to be confused and agitated, requiring frequent doses of Ativan. Continue BIPAP therapy and monitor.  3. COPD exacerbation. Cotinue nebs, abx, and pulmonary hygiene. Started steroid taper. Appreciate pulmonology assistance. Repeat CXR without changes.  4. CAP, WBC wnl and afebrile. Continue IV Levaquin. Could likely discontinue abx tomorrow, since this will be his 7th day. 5. Essential HTN, stable. Continue home meds 6. Leukocytosis, likely related to steroids. Resolved.    Code Status: Full DVT prophylaxis: Lovenox Family Communication: Family at bedside Disposition Plan: Anticipate discharge in 24-48 hours.    Consultants:  Pulmonology  PT -HHPT   Procedures:  none  Antibiotics:  Levquin 2/21 >>  HPI/Subjective: Feeling better. Breathing is improving.   Per family he has been doing a lot better then yesterday. He was more with it today.   Objective: Filed Vitals:   04/11/15 0400 04/11/15 0500  BP:  174/99  Pulse: 101 104  Temp: 97 F (36.1 C)   Resp: 23 21    Intake/Output Summary (Last 24 hours) at 04/11/15 8295 Last data filed at 04/11/15 0500  Gross per 24 hour  Intake   1725 ml  Output   2525 ml  Net   -800 ml   Filed Weights   04/08/15 0500 04/09/15 0400 04/11/15 0500  Weight: 63.8 kg (140 lb 10.5 oz) 65.1 kg (143 lb 8.3 oz) 66.6 kg (146 lb 13.2 oz)    Exam:  General: NAD Cardiovascular: RRR, S1, S2  Respiratory:  Mild wheeze.  Abdomen: soft, non tender, no distention , bowel sounds normal Psychiatric: Oriented to family and place. Musculoskeletal: No edema b/l  Data Reviewed: Basic Metabolic Panel:  Recent Labs Lab 04/06/15 0413 04/07/15 0453 04/08/15 0413 04/09/15 0451 04/11/15 0503  NA 141 140 142 142 138  K 4.0 3.9 5.0 5.1 4.9  CL 98* 100* 99* 98* 94*  CO2 34* 34* 39* 39* 38*  GLUCOSE 165* 148* 134* 135* 108*  BUN 55* 42* 33* 30* 26*  CREATININE 0.94 0.78 0.81 0.74 0.61  CALCIUM 8.6* 8.4* 8.4* 8.5* 8.1*   CBC:  Recent Labs Lab 04/05/15 1721 04/06/15 0413 04/07/15 0453 04/08/15 0413 04/09/15 0451 04/11/15 0503  WBC 7.0 5.3 13.1* 11.0* 9.6 8.3  NEUTROABS 5.6  --   --   --   --   --   HGB 13.5 12.2* 12.3* 12.5* 11.8* 12.7*  HCT 42.8 39.8 38.9* 40.8 39.3 40.2  MCV 98.6 98.0 96.8 101.0* 101.0* 96.9  PLT 131* 144* 176 204 198 218   Cardiac Enzymes:  Recent Labs Lab 04/05/15 1721  TROPONINI <0.03   BNP (last 3 results)  Recent Labs  04/05/15 1721  BNP 170.0*     Recent Results (from the past 240 hour(s))  MRSA PCR Screening     Status: None   Collection Time: 04/05/15 10:20 PM  Result Value Ref Range Status   MRSA by PCR NEGATIVE NEGATIVE Final    Comment:        The GeneXpert MRSA Assay (FDA  approved for NASAL specimens only), is one component of a comprehensive MRSA colonization surveillance program. It is not intended to diagnose MRSA infection nor to guide or monitor treatment for MRSA infections.      Studies: No results found.  Scheduled Meds: . amLODipine  5 mg Oral Daily  . enoxaparin (LOVENOX) injection  40 mg Subcutaneous Q24H  . guaiFENesin  1,200 mg Oral BID  . ipratropium-albuterol  3 mL Nebulization Q4H  . levofloxacin (LEVAQUIN) IV  750 mg Intravenous Q24H  . lisinopril  20 mg Oral Daily  . methylPREDNISolone (SOLU-MEDROL) injection  40 mg Intravenous Q6H  . mometasone-formoterol  2 puff Inhalation BID  . sodium chloride flush   3 mL Intravenous Q12H  . traZODone  50 mg Oral QHS   Continuous Infusions: . sodium chloride 75 mL/hr at 04/11/15 0500    Active Problems:   Hypertension   Acute respiratory failure with hypoxia and hypercarbia (HCC)   CAP (community acquired pneumonia)   Hypercapnic respiratory failure (HCC)   COPD exacerbation (HCC)   Encephalopathy, metabolic   Time spent: 25 minutes  Erick Blinks, MD. [facility] Pager [phone]. If 7PM-7AM, please contact night-coverage at [email or web address], password [facility] 04/11/2015, 6:52 AM  LOS: 6 days    By signing my name below, I, Zadie Cleverly, attest that this documentation has been prepared under the direction and in the presence of Erick Blinks, MD. Electronically signed: Zadie Cleverly, Scribe. 04/11/2015 10:49am   I, Dr. Erick Blinks, personally performed the services described in this documentaiton. All medical record entries made by the scribe were at my direction and in my presence. I have reviewed the chart and agree that the record reflects my personal performance and is accurate and complete  Erick Blinks, MD, 04/11/2015 11:00 AM

## 2015-04-11 NOTE — Progress Notes (Signed)
eLink Physician-Brief Progress Note Patient Name: Shane Todd DOB: [birth year] MRN: [id number]   Date of Service  04/11/2015  HPI/Events of Note  ABG on [place] O2 >> 7.42/58.9/62.6. ABG does not explain the patient's behavior. Sundowning? Withdrawal?  eICU Interventions  Will ask the in house physician to examine the patient prior to giving more sedating agents.      Intervention Category Intermediate Interventions: Diagnostic test evaluation  Lenell Antu 04/11/2015, 10:32 PM

## 2015-04-11 NOTE — Progress Notes (Signed)
eLink Physician-Brief Progress Note Patient Name: Shane Todd DOB: 1945/08/24 MRN: [id number]   Date of Service  04/11/2015  HPI/Events of Note  RN called to check on ABG results.   eICU Interventions  Pt comfortable. On BipaP 16/6  30%, rate of 12.  ABG 7.4/64/84 -- acceptable. Cont bipap for now.      Intervention Category Intermediate Interventions: Other:  Daneen Schick Dios 04/11/2015, 5:01 AM

## 2015-04-11 NOTE — Progress Notes (Signed)
Patient is finally resting. Will hold off on BIPAP right now, RN is worried if i try to attempt to place on his face it will agitate him some more. Arterial Blood Gas drawn earlier, improvement in C02. RT will continue to monitor. Bipap is on standby at bedside.

## 2015-04-12 ENCOUNTER — Inpatient Hospital Stay (HOSPITAL_COMMUNITY): Payer: Medicare Other

## 2015-04-12 LAB — BLOOD GAS, ARTERIAL
Acid-Base Excess: 11.5 mmol/L — ABNORMAL HIGH (ref 0.0–2.0)
BICARBONATE: 33.3 meq/L — AB (ref 20.0–24.0)
DELIVERY SYSTEMS: POSITIVE
Drawn by: 317771
EXPIRATORY PAP: 6
FIO2: 0.3
INSPIRATORY PAP: 18
O2 Saturation: 98.8 %
PH ART: 7.369 (ref 7.350–7.450)
RATE: 15 resp/min
TCO2: 19.2 mmol/L (ref 0–100)
pCO2 arterial: 65.9 mmHg (ref 35.0–45.0)
pO2, Arterial: 142 mmHg — ABNORMAL HIGH (ref 80.0–100.0)

## 2015-04-12 LAB — VITAMIN B12: VITAMIN B 12: 584 pg/mL (ref 180–914)

## 2015-04-12 LAB — URINALYSIS, ROUTINE W REFLEX MICROSCOPIC
BILIRUBIN URINE: NEGATIVE
Glucose, UA: NEGATIVE mg/dL
HGB URINE DIPSTICK: NEGATIVE
Ketones, ur: NEGATIVE mg/dL
Leukocytes, UA: NEGATIVE
Nitrite: NEGATIVE
PROTEIN: NEGATIVE mg/dL
Specific Gravity, Urine: 1.015 (ref 1.005–1.030)
pH: 7 (ref 5.0–8.0)

## 2015-04-12 LAB — BASIC METABOLIC PANEL
ANION GAP: 5 (ref 5–15)
BUN: 23 mg/dL — AB (ref 6–20)
CHLORIDE: 94 mmol/L — AB (ref 101–111)
CO2: 39 mmol/L — AB (ref 22–32)
Calcium: 8.2 mg/dL — ABNORMAL LOW (ref 8.9–10.3)
Creatinine, Ser: 0.61 mg/dL (ref 0.61–1.24)
GFR calc Af Amer: >60 mL/min (ref >60)
GFR calc non Af Amer: >60 mL/min (ref >60)
GLUCOSE: 110 mg/dL — AB (ref 65–99)
POTASSIUM: 5.4 mmol/L — AB (ref 3.5–5.1)
Sodium: 138 mmol/L (ref 135–145)

## 2015-04-12 LAB — TSH: TSH: 0.75 u[IU]/mL (ref 0.350–4.500)

## 2015-04-12 LAB — AMMONIA: Ammonia: 23 umol/L (ref 9–35)

## 2015-04-12 MED ORDER — SODIUM POLYSTYRENE SULFONATE 15 GM/60ML PO SUSP
30.0000 g | Freq: Once | ORAL | Status: AC
  Administered 2015-04-12: 30 g via ORAL
  Filled 2015-04-12: qty 120

## 2015-04-12 NOTE — Progress Notes (Signed)
[facility] PROGRESS NOTE  Shane Todd ZOX:[id number] DOB: [birth year] DOA: 04/05/2015 PCP: [name], MD Summary  89 yom with COPD and chronic respiratory failure on 2L at home presented with complaints of increased SOB, confusion, and somnolence. On admission he was found to have LLL CAP and has since completed a course of abx. He is noted to have improvement in ABG with BiPAP therapy, yet still remains confused and agitated. Pulmonology following. He is slowly improving, Anticipate discharge in 2-3 days.   Assessment/Plan: 1. Acute on chronic respiratory failure with hypoxia and hypercarbia. Slowly improving. Noted to have improvement in ABG with BIPAP however patient remains agitated. Will continue to monitor pCO2.  Discussed with Dr. Juanetta Gosling, patient will need to be on BIPAP qhs once he returns home. This will need to be set up by case management.  2. Acute encephalopathy. PCO2 has been in reasonable range and he does not have any fever or signs of infection. He has not been started on any new medications and does not appear to be withdrawing from any substances. Will obtain CT Head for further evaluation, as well as ammonia, B-12, TSH, UA, and RPR. It is possible he may have sundowning, since his wife has reported changes consistent with dementia in the last 6 months.Continue Ativan PRN. Consider neurology input if he does not improve. 3. COPD exacerbation. Continue nebs, and pulmonary hygiene. Continue steroid taper. He has completed a course of abx. Appreciate pulmonology assistance. Repeat CXR without changes.  4. Hyperkalemia, will give kayexalate.  5. CAP, WBC wnl and afebrile. He has completed a course of Levaquin.  6. Essential HTN, stable. Continue home meds 7. Leukocytosis, likely related to steroids. Resolved.    Code Status: Full DVT prophylaxis: Lovenox Family Communication: Family at bedside Disposition Plan: Anticipate discharge in 2-3 days.     Consultants:  Pulmonology  PT -HHPT   Procedures:  none  Antibiotics:  Levquin 2/21 >>2/27  HPI/Subjective: Unable to obtain due to patient being sedated.   Wife reports that he has been sedated due to agitation overnight. He has been showing signs of early dementia prior to hospitalization and seems to be increasingly agitated at night.   Objective: Filed Vitals:   04/12/15 0427 04/12/15 0500  BP: 108/65 127/67  Pulse: 85 80  Temp:    Resp: 15 4    Intake/Output Summary (Last 24 hours) at 04/12/15 0635 Last data filed at 04/12/15 0500  Gross per 24 hour  Intake   2205 ml  Output   3000 ml  Net   -795 ml   Filed Weights   04/09/15 0400 04/11/15 0500 04/12/15 0500  Weight: 65.1 kg (143 lb 8.3 oz) 66.6 kg (146 lb 13.2 oz) 64.1 kg (141 lb 5 oz)    Exam:  General: NAD. Somnolent and recently medicated.  Cardiovascular: RRR, S1, S2  Respiratory: clear bilaterally, No wheezing, rales or rhonchi Abdomen: soft, non tender, no distention , bowel sounds normal Musculoskeletal: No edema b/l  Data Reviewed: Basic Metabolic Panel:  Recent Labs Lab 04/07/15 0453 04/08/15 0413 04/09/15 0451 04/11/15 0503 04/12/15 0404  NA 140 142 142 138 138  K 3.9 5.0 5.1 4.9 5.4*  CL 100* 99* 98* 94* 94*  CO2 34* 39* 39* 38* 39*  GLUCOSE 148* 134* 135* 108* 110*  BUN 42* 33* 30* 26* 23*  CREATININE 0.78 0.81 0.74 0.61 0.61  CALCIUM 8.4* 8.4* 8.5* 8.1* 8.2*   CBC:  Recent Labs Lab 04/05/15 1721 04/06/15  1610 04/07/15 0453 04/08/15 0413 04/09/15 0451 04/11/15 0503  WBC 7.0 5.3 13.1* 11.0* 9.6 8.3  NEUTROABS 5.6  --   --   --   --   --   HGB 13.5 12.2* 12.3* 12.5* 11.8* 12.7*  HCT 42.8 39.8 38.9* 40.8 39.3 40.2  MCV 98.6 98.0 96.8 101.0* 101.0* 96.9  PLT 131* 144* 176 204 198 218   Cardiac Enzymes:  Recent Labs Lab 04/05/15 1721  TROPONINI <0.03   BNP (last 3 results)  Recent Labs  04/05/15 1721  BNP 170.0*     Recent Results (from the past  240 hour(s))  MRSA PCR Screening     Status: None   Collection Time: 04/05/15 10:20 PM  Result Value Ref Range Status   MRSA by PCR NEGATIVE NEGATIVE Final    Comment:        The GeneXpert MRSA Assay (FDA approved for NASAL specimens only), is one component of a comprehensive MRSA colonization surveillance program. It is not intended to diagnose MRSA infection nor to guide or monitor treatment for MRSA infections.      Studies: Dg Chest Port 1 View  04/11/2015  CLINICAL DATA:  Respiratory failure, community-acquired pneumonia, [place] OPD, current smoker. EXAM: PORTABLE CHEST 1 VIEW COMPARISON:  Portable chest x-ray of April 08, 2015 FINDINGS: The lungs are hyperinflated. The interstitial markings are coarse bilaterally. There are no discrete air bronchograms. There is no pleural effusion or pneumothorax. The heart and pulmonary vascularity are normal. The mediastinum is normal in width. The bony thorax exhibits no acute abnormality. 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 There is no alveolar pneumonia nor CHF. Electronically Signed   By: David  [place] M.D.   On: 04/11/2015 08:57    Scheduled Meds: . amLODipine  5 mg Oral Daily  . enoxaparin (LOVENOX) injection  40 mg Subcutaneous Q24H  . guaiFENesin  1,200 mg Oral BID  . ipratropium-albuterol  3 mL Nebulization Q4H  . levofloxacin (LEVAQUIN) IV  750 mg Intravenous Q24H  . lisinopril  20 mg Oral Daily  . methylPREDNISolone (SOLU-MEDROL) injection  40 mg Intravenous Q6H  . mometasone-formoterol  2 puff Inhalation BID  . sodium chloride flush  3 mL Intravenous Q12H  . traZODone  50 mg Oral QHS   Continuous Infusions: . sodium chloride 75 mL/hr at 04/11/15 1733    Active Problems:   Hypertension   Acute respiratory failure with hypoxia and hypercarbia (HCC)   CAP (community acquired pneumonia)   Hypercapnic respiratory failure (HCC)   COPD exacerbation (HCC)   Encephalopathy, metabolic   Time  spent: 25 minutes  Erick Blinks, MD. [facility] Pager [phone]. If 7PM-7AM, please contact night-coverage at [email or web address], password [facility] 04/12/2015, 6:35 AM  LOS: 7 days    By signing my name below, I, Zadie Cleverly, attest that this documentation has been prepared under the direction and in the presence of Erick Blinks, MD. Electronically signed: Zadie Cleverly, Scribe. 04/12/2015 9:45am  I, Dr. Erick Blinks, personally performed the services described in this documentaiton. All medical record entries made by the scribe were at my direction and in my presence. I have reviewed the chart and agree that the record reflects my personal performance and is accurate and complete  Erick Blinks, MD, 04/12/2015 6:35 AM

## 2015-04-12 NOTE — Progress Notes (Signed)
Patient remains restless at intervals. Very difficult to administer Kayexalate.

## 2015-04-12 NOTE — Progress Notes (Signed)
Patient taken to CT via bed. Medicated earlier with haldol sleeping before going to CT. Patient return via bed. CT not done patient restless not able remain still for scan.

## 2015-04-12 NOTE — Progress Notes (Signed)
Patient remains restless. Trying to climb out of bed and removing electrodes. Attempt to redirect patient. Mitts removed earlier by family mitts reapplied.

## 2015-04-12 NOTE — Progress Notes (Signed)
Physical Therapy Treatment Patient Details Name: Shane Todd MRN: [id number] DOB: [birth year] Today's Date: 04/12/2015    History of Present Illness 70yo white male comes to [facility] on 2/21 p increased SOB, AMS, and eventual somnolence. PMH: COPD on 2L at home since 2016. Pt found to be hypercapnic (pCO2: 83) and imaging showing likely LLL PNA. Admiteed for hypercapnic respiratory failure.  Over past couple days, patient's aggitation and AMS have been worsening without any clear etiology. Head CT now pending.     PT Comments    Pt tolerating treatment session well, participatory and following simple commands 50-75% of time, but remains lethargic during session. Pt maintains eyes closed during session, responds to questioning in what seems to be an appropriate manor, however speech is extremely garbled/slured and PT is unable to understand 80% of what is said. Pt has been demonstrating an overall decline in cognation since evaluation (per the medical record), with intermittent agitation, although O2 status does seem to have improved. Pt is able to participated with exercises in bed, following commands best with BLE activity, compared to BUE. SaO2 remains >89% on 2L throughout session. Neck range of motion has declined, the patient grimacing when attempting to move from 2 pillows to one, now with chronic forward head posturing. Functional strength is significantly declined since evaluation 5da. Patient presenting with impairment of strength, cognition, balance, and activity tolerance, limiting ability to perform ADL and mobility tasks at  baseline level of function. Patient will benefit from skilled intervention to address the above impairments and limitations, in order to restore to prior level of function, improve patient safety upon discharge, and to decrease caregiver burden.    Follow Up Recommendations  SNF     Equipment Recommendations  Other (comment)    Recommendations for Other Services        Precautions / Restrictions Precautions Precautions: Fall Restrictions Weight Bearing Restrictions: No    Mobility  Bed Mobility               General bed mobility comments: Attempted to inittiate OOB; pt demonstrating continued eyes closed, and requires total Assist for scooting in bed; deferred until AMS improves.   Transfers                    Ambulation/Gait                 Stairs            Wheelchair Mobility    Modified Rankin (Stroke Patients Only)       Balance                                    Cognition Arousal/Alertness: Lethargic Behavior During Therapy: WFL for tasks assessed/performed Overall Cognitive Status: Impaired/Different from baseline Area of Impairment: Attention;Following commands;Awareness;Safety/judgement     Memory: Decreased recall of precautions Following Commands: Follows one step commands inconsistently Safety/Judgement: Decreased awareness of safety;Decreased awareness of deficits Awareness: Anticipatory        Exercises Low Level/ICU Exercises Hip Extension: AAROM;15 reps Hip ABduction/ADduction: AAROM;15 reps Heel Slides: AAROM;15 reps;Supine Shoulder Press: AAROM;10 reps (difficulty following commands. )    General Comments        Pertinent Vitals/Pain Pain Assessment: No/denies pain    Home Living                      Prior Function  PT Goals (current goals can now be found in the care plan section) Acute Rehab PT Goals PT Goal Formulation: Patient unable to participate in goal setting Time For Goal Achievement: 04/21/15 Potential to Achieve Goals: Fair Progress towards PT goals: Not progressing toward goals - comment    Frequency  Min 3X/week    PT Plan Discharge plan needs to be updated    Co-evaluation             End of Session Equipment Utilized During Treatment: Oxygen Activity Tolerance: Patient limited by lethargy Patient  left: with family/visitor present;in bed;with bed alarm set;with call bell/phone within reach     Time: 1610-9604 PT Time Calculation (min) (ACUTE ONLY): 15 min  Charges:  $Therapeutic Activity: 8-22 mins                    G Codes:      2:05 PM, 05-07-2015 Rosamaria Lints, PT, DPT PRN Physical Therapist at [facility] [place] License # [id number] [phone] (wireless)  [phone] (mobile)

## 2015-04-12 NOTE — Progress Notes (Signed)
Placed patient back on BIPAP 18/6 30% RR-15. Tolerating at this time. RT will continue to monitor.

## 2015-04-12 NOTE — Progress Notes (Signed)
Subjective: He had more trouble with agitation and confusion last night. He received Haldol. Blood gas was checked to make sure it wasn't related to CO2 narcosis. This did not appear to be the case. He did go on BiPAP last night. This morning his blood gas is a little bit better off the BiPAP and it was on BiPAP. He is awake but sleepy.  Objective: Vital signs in last 24 hours: Temp:  [97.2 F (36.2 C)-97.4 F (36.3 C)] 97.4 F (36.3 C) (02/28 0400) Pulse Rate:  [75-120] 75 (02/28 0700) Resp:  [0-30] 14 (02/28 0700) BP: (108-192)/(63-127) 173/88 mmHg (02/28 0700) SpO2:  [90 %-100 %] 98 % (02/28 0700) FiO2 (%):  [30 %] 30 % (02/28 0425) Weight:  [64.1 kg (141 lb 5 oz)] 64.1 kg (141 lb 5 oz) (02/28 0500) Weight change: -2.5 kg (-5 lb 8.2 oz) Last BM Date: 04/08/15  Intake/Output from previous day: 02/27 0701 - 02/28 0700 In: 2205 [P.O.:480; I.V.:1725] Out: 3000 [Urine:3000]  PHYSICAL EXAM General appearance: Awake confused and sleepy Resp: rhonchi bilaterally Cardio: regular rate and rhythm, S1, S2 normal, no murmur, click, rub or gallop GI: soft, non-tender; bowel sounds normal; no masses,  no organomegaly Extremities: extremities normal, atraumatic, no cyanosis or edema  Lab Results:  Results for orders placed or performed during the hospital encounter of 04/05/15 (from the past 48 hour(s))  Blood gas, arterial     Status: Abnormal   Collection Time: 04/10/15  9:00 AM  Result Value Ref Range   FIO2 30.00    Delivery systems BILEVEL POSITIVE AIRWAY PRESSURE    Mode BILEVEL POSITIVE AIRWAY PRESSURE    Inspiratory PAP 18    Expiratory PAP 6    pH, Arterial 7.371 7.350 - 7.450   pCO2 arterial 72.4 (HH) 35.0 - 45.0 mmHg    Comment: CRITICAL RESULT CALLED TO, READ BACK BY AND VERIFIED WITH: HYLTON,L.RN AT 0910 04/09/14 BY BROADNAX,L.RRT    pO2, Arterial 92.8 80.0 - 100.0 mmHg   Bicarbonate 36.8 (H) 20.0 - 24.0 mEq/L   Acid-Base Excess 15.0 (H) 0.0 - 2.0 mmol/L   O2  Saturation 97.0 %   Collection site RIGHT RADIAL    Drawn by [id number]    Sample type ARTERIAL    Allens test (pass/fail) PASS PASS  Blood gas, arterial     Status: Abnormal   Collection Time: 04/11/15  4:09 AM  Result Value Ref Range   FIO2 0.30    Delivery systems BILEVEL POSITIVE AIRWAY PRESSURE    LHR 15.0 resp/min   Inspiratory PAP 18.0    Expiratory PAP 6.0    pH, Arterial 7.407 7.350 - 7.450   pCO2 arterial 63.9 (HH) 35.0 - 45.0 mmHg    Comment: CRITICAL RESULT CALLED TO, READ BACK BY AND VERIFIED WITH: JAMES DANIEL BY BFRETWELL RRT,RCP ON 04/11/15 AT 0420    pO2, Arterial 83.7 80.0 - 100.0 mmHg   Bicarbonate 36.1 (H) 20.0 - 24.0 mEq/L   TCO2 16.9 0 - 100 mmol/L   Acid-Base Excess 14.0 (H) 0.0 - 2.0 mmol/L   O2 Saturation 96.2 %   Collection site RIGHT RADIAL    Drawn by [id number]    Sample type ARTERIAL DRAW    Allens test (pass/fail) PASS PASS  Basic metabolic panel     Status: Abnormal   Collection Time: 04/11/15  5:03 AM  Result Value Ref Range   Sodium 138 135 - 145 mmol/L   Potassium 4.9 3.5 - 5.1 mmol/L   Chloride  94 (L) 101 - 111 mmol/L   CO2 38 (H) 22 - 32 mmol/L   Glucose, Bld 108 (H) 65 - 99 mg/dL   BUN 26 (H) 6 - 20 mg/dL   Creatinine, Ser 0.61 0.61 - 1.24 mg/dL   Calcium 8.1 (L) 8.9 - 10.3 mg/dL   GFR calc non Af Amer >60 >60 mL/min   GFR calc Af Amer >60 >60 mL/min    Comment: (NOTE) The eGFR has been calculated using the CKD EPI equation. This calculation has not been validated in all clinical situations. eGFR's persistently <60 mL/min signify possible Chronic Kidney Disease.    Anion gap 6 5 - 15  CBC     Status: Abnormal   Collection Time: 04/11/15  5:03 AM  Result Value Ref Range   WBC 8.3 4.0 - 10.5 K/uL   RBC 4.15 (L) 4.22 - 5.81 MIL/uL   Hemoglobin 12.7 (L) 13.0 - 17.0 g/dL   HCT 40.2 39.0 - 52.0 %   MCV 96.9 78.0 - 100.0 fL   MCH 30.6 26.0 - 34.0 pg   MCHC 31.6 30.0 - 36.0 g/dL   RDW 13.2 11.5 - 15.5 %   Platelets 218 150 - 400 K/uL   Blood gas, arterial     Status: Abnormal   Collection Time: 04/11/15 11:15 AM  Result Value Ref Range   O2 Content 2.0 L/min   pH, Arterial 7.386 7.350 - 7.450   pCO2 arterial 67.1 (HH) 35.0 - 45.0 mmHg    Comment: CRITICAL RESULT CALLED TO, READ BACK BY AND VERIFIED WITH: ERICA SPANGLER RN BY ROBIN POWELL RRT ON 04/11/15 AT 1127    pO2, Arterial 112 (H) 80.0 - 100.0 mmHg   Bicarbonate 35.6 (H) 20.0 - 24.0 mEq/L   Acid-Base Excess 13.6 (H) 0.0 - 2.0 mmol/L   O2 Saturation 97.9 %   Collection site LEFT RADIAL    Drawn by [id number]    Sample type ARTERIAL    Allens test (pass/fail) PASS PASS  Blood gas, arterial     Status: Abnormal   Collection Time: 04/11/15 10:00 PM  Result Value Ref Range   O2 Content 3.0 L/min   Delivery systems NASAL CANNULA    pH, Arterial 7.422 7.350 - 7.450   pCO2 arterial 58.9 (HH) 35.0 - 45.0 mmHg    Comment: CRITICAL RESULT CALLED TO, READ BACK BY AND VERIFIED WITH: ROBBIE WAGONER,RN BY BFRETWELL RRT,RCP ON 04/11/15 AT 2210    pO2, Arterial 62.6 (L) 80.0 - 100.0 mmHg   Bicarbonate 34.5 (H) 20.0 - 24.0 mEq/L   TCO2 18.1 0 - 100 mmol/L   Acid-Base Excess 12.6 (H) 0.0 - 2.0 mmol/L   O2 Saturation 91.7 %   Collection site RIGHT RADIAL    Drawn by [id number]    Sample type ARTERIAL DRAW    Allens test (pass/fail) PASS PASS  Basic metabolic panel     Status: Abnormal   Collection Time: 04/12/15  4:04 AM  Result Value Ref Range   Sodium 138 135 - 145 mmol/L   Potassium 5.4 (H) 3.5 - 5.1 mmol/L   Chloride 94 (L) 101 - 111 mmol/L   CO2 39 (H) 22 - 32 mmol/L   Glucose, Bld 110 (H) 65 - 99 mg/dL   BUN 23 (H) 6 - 20 mg/dL   Creatinine, Ser 0.61 0.61 - 1.24 mg/dL   Calcium 8.2 (L) 8.9 - 10.3 mg/dL   GFR calc non Af Amer >60 >60 mL/min   GFR calc Af  Amer >60 >60 mL/min    Comment: (NOTE) The eGFR has been calculated using the CKD EPI equation. This calculation has not been validated in all clinical situations. eGFR's persistently <60 mL/min signify possible  Chronic Kidney Disease.    Anion gap 5 5 - 15  Blood gas, arterial     Status: Abnormal   Collection Time: 04/12/15  4:28 AM  Result Value Ref Range   FIO2 0.30    Delivery systems BILEVEL POSITIVE AIRWAY PRESSURE    LHR 15.0 resp/min   Inspiratory PAP 18.0    Expiratory PAP 6.0    pH, Arterial 7.369 7.350 - 7.450   pCO2 arterial 65.9 (HH) 35.0 - 45.0 mmHg    Comment: CRITICAL RESULT CALLED TO, READ BACK BY AND VERIFIED WITH: ROBBIE WAGONER,RN BY BFRETWELL RRT,RCP ON 04/12/15 AT 0439    pO2, Arterial 142 (H) 80.0 - 100.0 mmHg   Bicarbonate 33.3 (H) 20.0 - 24.0 mEq/L   TCO2 19.2 0 - 100 mmol/L   Acid-Base Excess 11.5 (H) 0.0 - 2.0 mmol/L   O2 Saturation 98.8 %   Collection site RIGHT RADIAL    Drawn by [id number]    Sample type ARTERIAL DRAW    Allens test (pass/fail) PASS PASS    ABGS  Recent Labs  04/12/15 0428  PHART 7.369  PO2ART 142*  TCO2 19.2  HCO3 33.3*   CULTURES Recent Results (from the past 240 hour(s))  MRSA PCR Screening     Status: None   Collection Time: 04/05/15 10:20 PM  Result Value Ref Range Status   MRSA by PCR NEGATIVE NEGATIVE Final    Comment:        The GeneXpert MRSA Assay (FDA approved for NASAL specimens only), is one component of a comprehensive MRSA colonization surveillance program. It is not intended to diagnose MRSA infection nor to guide or monitor treatment for MRSA infections.    Studies/Results: Dg Chest Port 1 View  04/11/2015  CLINICAL DATA:  Respiratory failure, community-acquired pneumonia, Celsius OPD, current smoker. EXAM: PORTABLE CHEST 1 VIEW COMPARISON:  Portable chest x-ray of April 08, 2015 FINDINGS: The lungs are hyperinflated. The interstitial markings are coarse bilaterally. There are no discrete air bronchograms. There is no pleural effusion or pneumothorax. The heart and pulmonary vascularity are normal. The mediastinum is normal in width. The bony thorax exhibits no acute abnormality. IMPRESSION: COPD with  chronic pleural and parenchymal scarring. There is no alveolar pneumonia nor CHF. Electronically Signed   By: David  Martinique M.D.   On: 04/11/2015 08:57    Medications:  Prior to Admission:  Prescriptions prior to admission  Medication Sig Dispense Refill Last Dose  . albuterol (PROVENTIL HFA;VENTOLIN HFA) 108 (90 BASE) MCG/ACT inhaler Inhale 2 puffs into the lungs every 4 (four) hours as needed for wheezing or shortness of breath. For shortness of breath   unknown  . amLODipine (NORVASC) 5 MG tablet Take 5 mg by mouth every morning.   04/05/2015 at Unknown time  . fluticasone furoate-vilanterol (BREO ELLIPTA) 100-25 MCG/INH AEPB Inhale 1 puff into the lungs daily.   04/05/2015 at Unknown time  . lisinopril (PRINIVIL,ZESTRIL) 20 MG tablet Take 20 mg by mouth every morning.    04/05/2015 at Unknown time  . mometasone-formoterol (DULERA) 100-5 MCG/ACT AERO Inhale 2 puffs into the lungs 2 (two) times daily. (Patient taking differently: Inhale 1 puff into the lungs 2 (two) times daily. ) 1 Inhaler 0 04/05/2015 at Unknown time  . traZODone (DESYREL) 100 MG tablet  Take 100 mg by mouth at bedtime.   04/04/2015 at Unknown time  . umeclidinium-vilanterol (ANORO ELLIPTA) 62.5-25 MCG/INH AEPB Inhale 1 puff into the lungs 2 (two) times daily.   04/05/2015 at Unknown time   Scheduled: . amLODipine  5 mg Oral Daily  . enoxaparin (LOVENOX) injection  40 mg Subcutaneous Q24H  . guaiFENesin  1,200 mg Oral BID  . ipratropium-albuterol  3 mL Nebulization Q4H  . levofloxacin (LEVAQUIN) IV  750 mg Intravenous Q24H  . lisinopril  20 mg Oral Daily  . methylPREDNISolone (SOLU-MEDROL) injection  40 mg Intravenous Q6H  . mometasone-formoterol  2 puff Inhalation BID  . sodium chloride flush  3 mL Intravenous Q12H  . traZODone  50 mg Oral QHS   Continuous: . sodium chloride 75 mL/hr at 04/11/15 1733   VGC:YOYOOJ chloride, acetaminophen **OR** acetaminophen, albuterol, alum & mag hydroxide-simeth, haloperidol lactate,  LORazepam, morphine injection, ondansetron **OR** ondansetron (ZOFRAN) IV, sodium chloride flush  Assesment: He has acute hypoxic and hypercapnic respiratory failure. He has some element of chronic respiratory failure and also he has COPD at baseline and has exacerbation. He has community-acquired pneumonia which I think is getting better. At about encephalopathy which is ongoing. He does not appear to be withdrawing from anything as he doesn't have any medications at home that would typically cause that problem and is not using any alcohol or illicit drugs. I wonder if he has some baseline dementia Active Problems:   Hypertension   Acute respiratory failure with hypoxia and hypercarbia (HCC)   CAP (community acquired pneumonia)   Hypercapnic respiratory failure (HCC)   COPD exacerbation (HCC)   Encephalopathy, metabolic    Plan: Continue current treatments. He is I think slowly improving.    LOS: 7 days   Ben Sanz L 04/12/2015, 8:56 AM

## 2015-04-12 NOTE — Care Management Note (Signed)
Case Management Note  Patient Details  Name: HARDIN HARDENBROOK MRN: [id number] Date of Birth: [birth year]   Expected Discharge Date:  04/08/15               Expected Discharge Plan:  Skilled Nursing Facility  In-House Referral:  Clinical Social Work  Discharge planning Services  CM Consult  Post Acute Care Choice:  NA Choice offered to:  NA  DME Arranged:    DME Agency:     HH Arranged:    HH Agency:     Status of Service:  In process, will continue to follow  Medicare Important Message Given:    Date Medicare IM Given:    Medicare IM give by:    Date Additional Medicare IM Given:    Additional Medicare Important Message give by:     If discussed at Long Length of Stay Meetings, dates discussed:  04/12/2015  Additional Comments: PT now recommending SNF. CSW is aware and will arrange for placement. Pt anticipated to need NIV HS at home. [facility] (who provides pt's home O2) is aware of need for NIV. Will cont to provide agency with updates. If pt is DC'd to SNF, the SNF will provide Bipap/NIV until DC from their facility. Will cont to follow.   Malcolm Metro, RN 04/12/2015, 4:00 PM

## 2015-04-13 DIAGNOSIS — J9602 Acute respiratory failure with hypercapnia: Secondary | ICD-10-CM

## 2015-04-13 DIAGNOSIS — Z720 Tobacco use: Secondary | ICD-10-CM

## 2015-04-13 DIAGNOSIS — J189 Pneumonia, unspecified organism: Secondary | ICD-10-CM

## 2015-04-13 DIAGNOSIS — J441 Chronic obstructive pulmonary disease with (acute) exacerbation: Secondary | ICD-10-CM

## 2015-04-13 DIAGNOSIS — G9341 Metabolic encephalopathy: Secondary | ICD-10-CM

## 2015-04-13 DIAGNOSIS — I10 Essential (primary) hypertension: Secondary | ICD-10-CM

## 2015-04-13 DIAGNOSIS — J9601 Acute respiratory failure with hypoxia: Secondary | ICD-10-CM

## 2015-04-13 LAB — BASIC METABOLIC PANEL
Anion gap: 7 (ref 5–15)
BUN: 26 mg/dL — AB (ref 6–20)
CO2: 36 mmol/L — ABNORMAL HIGH (ref 22–32)
CREATININE: 0.47 mg/dL — AB (ref 0.61–1.24)
Calcium: 8.3 mg/dL — ABNORMAL LOW (ref 8.9–10.3)
Chloride: 94 mmol/L — ABNORMAL LOW (ref 101–111)
GFR calc Af Amer: >60 mL/min (ref >60)
Glucose, Bld: 97 mg/dL (ref 65–99)
Potassium: 4.7 mmol/L (ref 3.5–5.1)
SODIUM: 137 mmol/L (ref 135–145)

## 2015-04-13 LAB — CBC
HCT: 46.4 % (ref 39.0–52.0)
Hemoglobin: 14.8 g/dL (ref 13.0–17.0)
MCH: 30.3 pg (ref 26.0–34.0)
MCHC: 31.9 g/dL (ref 30.0–36.0)
MCV: 94.9 fL (ref 78.0–100.0)
PLATELETS: 201 10*3/uL (ref 150–400)
RBC: 4.89 MIL/uL (ref 4.22–5.81)
RDW: 12.9 % (ref 11.5–15.5)
WBC: 8.5 10*3/uL (ref 4.0–10.5)

## 2015-04-13 LAB — RPR: RPR: NONREACTIVE

## 2015-04-13 LAB — HIV ANTIBODY (ROUTINE TESTING W REFLEX): HIV SCREEN 4TH GENERATION: NONREACTIVE

## 2015-04-13 MED ORDER — NICOTINE 21 MG/24HR TD PT24
21.0000 mg | MEDICATED_PATCH | Freq: Every day | TRANSDERMAL | Status: DC
  Administered 2015-04-13 – 2015-04-18 (×6): 21 mg via TRANSDERMAL
  Filled 2015-04-13 (×6): qty 1

## 2015-04-13 MED ORDER — HYDRALAZINE HCL 20 MG/ML IJ SOLN
5.0000 mg | INTRAMUSCULAR | Status: DC | PRN
  Administered 2015-04-13 – 2015-04-15 (×5): 5 mg via INTRAVENOUS
  Filled 2015-04-13 (×5): qty 1

## 2015-04-13 MED ORDER — METHYLPREDNISOLONE SODIUM SUCC 40 MG IJ SOLR
40.0000 mg | Freq: Three times a day (TID) | INTRAMUSCULAR | Status: DC
  Administered 2015-04-13 – 2015-04-15 (×6): 40 mg via INTRAVENOUS
  Filled 2015-04-13 (×6): qty 1

## 2015-04-13 MED ORDER — LORAZEPAM 2 MG/ML IJ SOLN
0.5000 mg | INTRAMUSCULAR | Status: DC | PRN
  Administered 2015-04-14 – 2015-04-16 (×4): 1 mg via INTRAVENOUS
  Filled 2015-04-13 (×4): qty 1

## 2015-04-13 NOTE — Progress Notes (Signed)
[facility] PROGRESS NOTE  Shane Todd WGN:[id number] DOB: [birth year] DOA: 04/05/2015 PCP: [name], MD Summary  75 yom with COPD and chronic respiratory failure on 2L at home presented with complaints of increased SOB, confusion, and somnolence. On admission he was found to have LLL CAP and has since completed a course of abx. He is noted to have improvement in ABG with BiPAP therapy, yet still remains confused and agitated. Pulmonology following. He is slowly improving.   Assessment/Plan: Acute on chronic respiratory failure with hypoxia and hypercarbia, secondary to CAP and COPD exacerbation. Patient was admitted on 04/05/15 for shortness of breath. Due to respiratory acidosis with a pH of 7.2, hypercarbia and hypoxia with an initial PCO2 of 83, he was started on BiPAP and oxygen and DuoNeb nebulizers. His initial chest x-ray revealed left lower lobe pneumonia. He was also noted to have bronchospasms and was diagnosed with COPD exacerbation. He was started on IV Levaquin and IV Solu-Medrol. Dr. Juanetta Gosling was consulted and has been following the patient. He advised BiPAP for the patient at home as the patient will likely need it.  -Patient's most recent ABG revealed improvement in his PCO2 which is likely at baseline ranging from 60-65. His oxygen saturations have improved on nasal cannula oxygen.  -Chest x-ray on 04/11/15 revealed stable COPD with parenchymal scarring, but no CHF or pneumonia. -He has completed a course of IV Levaquin. IV Solu-Medrol is being tapered. We'll decrease it to 40 mg IV every 8 hours. -Home BiPAP will need to be set up by case management. -Slowly improving.   Acute encephalopathy/delirium, etiology multifactorial. Patient was encephalopathic on admission, which was thought to be secondary to CO2 narcosis and acute illness. However, during the course of the hospitalization, he has become more intermittently confused, even with improvement in his  PCO2. Per his wife, she reports mental status changes consistent with dementia in the past 6 months. -Investigation will studies were ordered. His UA was within normal limits; ammonia level, TSH, and vitamin B12 were within normal limits. His RPR was nonreactive. -CT head and HIV are pending. -Patient is lethargic this morning, likely from recent Ativan given. -We will ask nursing to try to calm patient with non-medicinal measures. We'll decrease Ativan dosing and advise use of Haldol for agitation and delirium. Consider Recruitment consultant. -Etiology of his encephalopathy is likely multifactorial including mild dementia with sundowning, possible CO2 narcosis, ICU delirium, and/or steroid-induced delirium.  Hypertension. Patient is treated with Norvasc and lisinopril chronically. Both were restarted. His blood pressure has been trending up, which may be secondary to IV steroid and agitation. -We'll order when necessary hydralazine and consider clonidine which will have a calming effect on his CNS.  Tobacco abuse. Patient has been requesting to go outside to smoke per nursing staff. Have already ordered a nicotine patch which may help with decreasing some of his delirium.  Hyperkalemia. Patient serum potassium increased to 5.4. He was given Kayexalate with improvement. -We'll continue to monitor his serum potassium on lisinopril. Consider decreasing the dose.      Code Status: Full DVT prophylaxis: Lovenox Family Communication: Family not available Disposition Plan: Anticipate discharge in 2-3 days.    Consultants:  Pulmonology  PT -HHPT   Procedures:  BiPAP  Antibiotics:  Levquin 2/21 >>2/27  HPI/Subjective: Patient is lethargic and opens his eyes to voice and mumbles, but I'm unable to decipher.     Objective: Filed Vitals:   04/13/15 0400 04/13/15 0500  BP: 147/80 175/103  Pulse: 86 85  Temp: 97.5 F (36.4 C)   Resp: 17 31   temperature 97.5. Pulse 85. Respiratory  rate 31. Blood pressure 175/103. Oxygen saturation 99% on nasal cannula oxygen.  Intake/Output Summary (Last 24 hours) at 04/13/15 0817 Last data filed at 04/13/15 0500  Gross per 24 hour  Intake   2115 ml  Output   2000 ml  Net    115 ml   Filed Weights   04/11/15 0500 04/12/15 0500 04/13/15 0500  Weight: 66.6 kg (146 lb 13.2 oz) 64.1 kg (141 lb 5 oz) 62.3 kg (137 lb 5.6 oz)    Exam:  General: 70 year old who is currently lethargic after being given Ativan recently. Cardiovascular: S1, S2, no murmurs rubs or gallops. Respiratory: Occasional upper airway crackles; rare wheeze Abdomen: soft, positive bowel sounds, soft, nontender, nondistended. Musculoskeletal: No pedal edema. No acute hot red joints. Neurologic: He is lethargic and tries to take off the bilateral hand mittens; he mumbles, but it is unintelligible. No obvious facial droop.  Data Reviewed: Basic Metabolic Panel:  Recent Labs Lab 04/08/15 0413 04/09/15 0451 04/11/15 0503 04/12/15 0404 04/13/15 0444  NA 142 142 138 138 137  K 5.0 5.1 4.9 5.4* 4.7  CL 99* 98* 94* 94* 94*  CO2 39* 39* 38* 39* 36*  GLUCOSE 134* 135* 108* 110* 97  BUN 33* 30* 26* 23* 26*  CREATININE 0.81 0.74 0.61 0.61 0.47*  CALCIUM 8.4* 8.5* 8.1* 8.2* 8.3*   CBC:  Recent Labs Lab 04/07/15 0453 04/08/15 0413 04/09/15 0451 04/11/15 0503 04/13/15 0444  WBC 13.1* 11.0* 9.6 8.3 8.5  HGB 12.3* 12.5* 11.8* 12.7* 14.8  HCT 38.9* 40.8 39.3 40.2 46.4  MCV 96.8 101.0* 101.0* 96.9 94.9  PLT 176 204 198 218 201   Cardiac Enzymes: No results for input(s): CKTOTAL, CKMB, CKMBINDEX, TROPONINI in the last 168 hours. BNP (last 3 results)  Recent Labs  04/05/15 1721  BNP 170.0*     Recent Results (from the past 240 hour(s))  MRSA PCR Screening     Status: None   Collection Time: 04/05/15 10:20 PM  Result Value Ref Range Status   MRSA by PCR NEGATIVE NEGATIVE Final    Comment:        The GeneXpert MRSA Assay (FDA approved for NASAL  specimens only), is one component of a comprehensive MRSA colonization surveillance program. It is not intended to diagnose MRSA infection nor to guide or monitor treatment for MRSA infections.      Studies: Dg Chest Port 1 View  04/11/2015  CLINICAL DATA:  Respiratory failure, community-acquired pneumonia, Celsius OPD, current smoker. EXAM: PORTABLE CHEST 1 VIEW COMPARISON:  Portable chest x-ray of April 08, 2015 FINDINGS: The lungs are hyperinflated. The interstitial markings are coarse bilaterally. There are no discrete air bronchograms. There is no pleural effusion or pneumothorax. The heart and pulmonary vascularity are normal. The mediastinum is normal in width. The bony thorax exhibits no acute abnormality. 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 There is no alveolar pneumonia nor CHF. Electronically Signed   By: David  [place] M.D.   On: 04/11/2015 08:57    Scheduled Meds: . amLODipine  5 mg Oral Daily  . enoxaparin (LOVENOX) injection  40 mg Subcutaneous Q24H  . guaiFENesin  1,200 mg Oral BID  . ipratropium-albuterol  3 mL Nebulization Q4H  . lisinopril  20 mg Oral Daily  . methylPREDNISolone (SOLU-MEDROL) injection  40 mg Intravenous Q6H  . mometasone-formoterol  2 puff Inhalation  BID  . nicotine  21 mg Transdermal Daily  . sodium chloride flush  3 mL Intravenous Q12H   Continuous Infusions: . sodium chloride 75 mL/hr at 04/12/15 2114    Principal Problem:   Acute respiratory failure with hypoxia and hypercarbia (HCC) Active Problems:   CAP (community acquired pneumonia)   Hypercapnic respiratory failure (HCC)   COPD exacerbation (HCC)   Tobacco abuse   Essential hypertension   Encephalopathy, metabolic   Time spent: 30 minutes  Elliot Cousin, M.D. [facility] Pager [phone] If 7PM-7AM, please contact night-coverage at [email or web address], password [facility] 04/13/2015, 8:17 AM  LOS: 8 days

## 2015-04-13 NOTE — [place] FL2 (Signed)
[place] MEDICAID FL2 LEVEL OF CARE SCREENING TOOL     IDENTIFICATION  Patient Name: Shane Todd Birthdate: 1945/11/29 Sex: male Admission Date (Current Location): 04/05/2015  [facility] [place] Number:  [employer] and Address:  [facility],  [address] [place] [address]      Provider Number: [id number]  Attending Physician Name and Address:  Elliot Cousin, MD  Relative Name and Phone Number:       Current Level of Care: Hospital Recommended Level of Care: Skilled Nursing Facility Prior Approval Number:    Date Approved/Denied:   PASRR Number: [id number]  Discharge Plan:      Current Diagnoses: Patient Active Problem List   Diagnosis Date Noted  . Encephalopathy, metabolic 04/08/2015  . Hypercapnic respiratory failure (HCC) 04/05/2015  . COPD exacerbation (HCC) 04/05/2015  . Acute respiratory failure with hypercapnia (HCC)   . Dyspnea 07/02/2013  . Acute respiratory failure with hypoxia and hypercarbia (HCC) 07/02/2013  . Protein calorie malnutrition (HCC) 07/02/2013  . Acute encephalopathy 07/02/2013  . Community acquired bacterial pneumonia 07/02/2013  . CAP (community acquired pneumonia) 07/02/2013  . Left inguinal hernia 05/12/2012  . Anxiety   . Essential hypertension   . Benign hypertension 04/25/2011  . Anemia 04/24/2011  . Acute respiratory failure (HCC) 04/23/2011  . COPD with exacerbation (HCC) 04/23/2011  . Community acquired pneumonia 04/23/2011  . Sinus tachycardia (HCC) 04/23/2011  . Tobacco abuse 04/23/2011  . Hyperglycemia 04/23/2011  . Dehydration 04/23/2011    Orientation RESPIRATION BLADDER Height & Weight     Self  O2 (2L) Continent Weight: 137 lb 5.6 oz (62.3 kg) Height:   (185.4 cm)  BEHAVIORAL SYMPTOMS/MOOD NEUROLOGICAL BOWEL NUTRITION STATUS      Continent Diet (Reguarl Diet, Thin Liquids)  AMBULATORY STATUS COMMUNICATION OF NEEDS Skin   Limited Assist                            Personal Care Assistance Level of Assistance  Bathing, Dressing, Feeding Bathing Assistance: Limited assistance Feeding assistance: Limited assistance Dressing Assistance: Limited assistance     Functional Limitations Info  Sight, Hearing, Speech Sight Info: Adequate Hearing Info: Adequate Speech Info: Adequate    SPECIAL CARE FACTORS FREQUENCY  PT (By licensed PT)     PT Frequency: 5              Contractures      Additional Factors Info  Code Status, Allergies Code Status Info: Full Code Allergies Info: Oxycodone           Current Medications (04/13/2015):  This is the current hospital active medication list Current Facility-Administered Medications  Medication Dose Route Frequency Provider Last Rate Last Dose  . 0.9 %  sodium chloride infusion  250 mL Intravenous PRN Ron Parker, MD      . 0.9 %  sodium chloride infusion   Intravenous Continuous Erick Blinks, MD 75 mL/hr at 04/13/15 0701    . acetaminophen (TYLENOL) tablet 650 mg  650 mg Oral Q6H PRN Ron Parker, MD       Or  . acetaminophen (TYLENOL) suppository 650 mg  650 mg Rectal Q6H PRN Ron Parker, MD      . albuterol (PROVENTIL) (2.5 MG/3ML) 0.083% nebulizer solution 2.5 mg  2.5 mg Nebulization Q2H PRN Kari Baars, MD      . alum & mag hydroxide-simeth (MAALOX/MYLANTA) 200-200-20 MG/5ML suspension 30 mL  30  mL Oral Q6H PRN Ron Parker, MD   30 mL at 04/09/15 0829  . amLODipine (NORVASC) tablet 5 mg  5 mg Oral Daily Erick Blinks, MD   5 mg at 04/12/15 0900  . enoxaparin (LOVENOX) injection 40 mg  40 mg Subcutaneous Q24H Briscoe Deutscher, MD   40 mg at 04/12/15 2223  . guaiFENesin (MUCINEX) 12 hr tablet 1,200 mg  1,200 mg Oral BID Erick Blinks, MD   1,200 mg at 04/12/15 2114  . haloperidol lactate (HALDOL) injection 1 mg  1 mg Intravenous Q3H PRN Karl Ito, MD   1 mg at 04/13/15 0413  . hydrALAZINE (APRESOLINE) injection 5 mg  5 mg Intravenous Q4H PRN Elliot Cousin,  MD      . ipratropium-albuterol (DUONEB) 0.5-2.5 (3) MG/3ML nebulizer solution 3 mL  3 mL Nebulization Q4H Ron Parker, MD   3 mL at 04/13/15 1109  . lisinopril (PRINIVIL,ZESTRIL) tablet 20 mg  20 mg Oral Daily Erick Blinks, MD   20 mg at 04/12/15 0900  . LORazepam (ATIVAN) injection 0.5-1 mg  0.5-1 mg Intravenous Q3H PRN Elliot Cousin, MD      . methylPREDNISolone sodium succinate (SOLU-MEDROL) 40 mg/mL injection 40 mg  40 mg Intravenous 3 times per day Elliot Cousin, MD      . mometasone-formoterol [facility]) 100-5 MCG/ACT inhaler 2 puff  2 puff Inhalation BID Erick Blinks, MD   2 puff at 04/11/15 0813  . morphine 4 MG/ML injection 4 mg  4 mg Intravenous Q4H PRN Kari Baars, MD   4 mg at 04/12/15 2005  . nicotine (NICODERM CQ - dosed in mg/24 hours) patch 21 mg  21 mg Transdermal Daily Elliot Cousin, MD   21 mg at 04/13/15 1044  . ondansetron (ZOFRAN) tablet 4 mg  4 mg Oral Q6H PRN Ron Parker, MD   4 mg at 04/07/15 0925   Or  . ondansetron [facility]) injection 4 mg  4 mg Intravenous Q6H PRN Ron Parker, MD   4 mg at 04/09/15 1610  . sodium chloride flush (NS) 0.9 % injection 3 mL  3 mL Intravenous Q12H Ron Parker, MD   3 mL at 04/13/15 1049  . sodium chloride flush (NS) 0.9 % injection 3 mL  3 mL Intravenous PRN Ron Parker, MD         Discharge Medications: Please see discharge summary for a list of discharge medications.  Relevant Imaging Results:  Relevant Lab Results:   Additional Information SSN:  [id number]  Dede Query, LCSW

## 2015-04-13 NOTE — Clinical Social Work Note (Signed)
Clinical Social Work Assessment  Patient Details  Name: Shane Todd MRN: [id number] Date of Birth: [birth year]  Date of referral:  04/13/15               Reason for consult:  Facility Placement                Permission sought to share information with:    Permission granted to share information::     Name::        Agency::     Relationship::     Contact Information:     Housing/Transportation Living arrangements for the past 2 months:  Single Family Home Source of Information:  Spouse Patient Interpreter Needed:  None Criminal Activity/Legal Involvement Pertinent to Current Situation/Hospitalization:  No - Comment as needed Significant Relationships:  Spouse Lives with:  Spouse Do you feel safe going back to the place where you live?  Yes Need for family participation in patient care:  Yes (Comment)  Care giving concerns:  None identified.    Social Worker assessment / plan:  Mrs. Grieshaber advised that she and patient reside in the home together. She stated that patient ambulates unassisted but has an unsteady gait particularly when he is have a COPD exacerbation. She stated that patient has a difficult time taking a shower due to being claustrophobic.  She stated that he is able to toilet by himself.  Mrs. Sam stated that she was agreeable for patient going to rehab.  She stated that would prefer that he remained in [place], thus choosing [facility] and [facility]. CSW provided her with a SNF list.   Employment status:  Retired Health and safety inspector:  Medicare PT Recommendations:  Skilled Nursing Facility Information / Referral to community resources:  Skilled Nursing Facility  Patient/Family's Response to care:  Spouse is agreeable for patient to go to SNF.  She prefers [facility] or [facility].   Patient/Family's Understanding of and Emotional Response to Diagnosis, Current Treatment, and Prognosis:  Patient's wife understands patient's diagnosis, treatment and prognosis.    Emotional  Assessment Appearance:  Developmentally appropriate Attitude/Demeanor/Rapport:  Unable to Assess Affect (typically observed):  Unable to Assess Orientation:    Alcohol / Substance use:  Not Applicable Psych involvement (Current and /or in the community):     Discharge Needs  Concerns to be addressed:  Discharge Planning Concerns Readmission within the last 30 days:  Yes Current discharge risk:  Chronically ill Barriers to Discharge:  No Barriers Identified   Annice Needy, LCSW 04/13/2015, 10:44 AM

## 2015-04-13 NOTE — Progress Notes (Signed)
Subjective: He remains confused. He is on BiPAP now but has been able to come off. It has been recommended that he go to skilled care facility at discharge I think that is appropriate. With further discussion with his family today it appears these had more problem with dementia at home with some sundowning.  Objective: Vital signs in last 24 hours: Temp:  [97.1 F (36.2 C)-97.7 F (36.5 C)] 97.5 F (36.4 C) (03/01 0824) Pulse Rate:  [78-121] 85 (03/01 0500) Resp:  [13-31] 31 (03/01 0500) BP: (108-182)/(56-108) 175/103 mmHg (03/01 0500) SpO2:  [91 %-100 %] 99 % (03/01 0804) FiO2 (%):  [30 %] 30 % (03/01 0302) Weight:  [62.3 kg (137 lb 5.6 oz)] 62.3 kg (137 lb 5.6 oz) (03/01 0500) Weight change: -1.8 kg (-3 lb 15.5 oz) Last BM Date: 04/08/15  Intake/Output from previous day: 02/28 0701 - 03/01 0700 In: 2115 [P.O.:240; I.V.:1875] Out: 2700 [Urine:2700]  PHYSICAL EXAM General appearance: On BiPAP, confused Resp: rhonchi bilaterally Cardio: regular rate and rhythm, S1, S2 normal, no murmur, click, rub or gallop GI: soft, non-tender; bowel sounds normal; no masses,  no organomegaly Extremities: extremities normal, atraumatic, no cyanosis or edema  Lab Results:  Results for orders placed or performed during the hospital encounter of 04/05/15 (from the past 48 hour(s))  Blood gas, arterial     Status: Abnormal   Collection Time: 04/11/15 11:15 AM  Result Value Ref Range   O2 Content 2.0 L/min   pH, Arterial 7.386 7.350 - 7.450   pCO2 arterial 67.1 (HH) 35.0 - 45.0 mmHg    Comment: CRITICAL RESULT CALLED TO, READ BACK BY AND VERIFIED WITH: ERICA SPANGLER RN BY ROBIN POWELL RRT ON 04/11/15 AT 1127    pO2, Arterial 112 (H) 80.0 - 100.0 mmHg   Bicarbonate 35.6 (H) 20.0 - 24.0 mEq/L   Acid-Base Excess 13.6 (H) 0.0 - 2.0 mmol/L   O2 Saturation 97.9 %   Collection site LEFT RADIAL    Drawn by [id number]    Sample type ARTERIAL    Allens test (pass/fail) PASS PASS  Blood gas, arterial      Status: Abnormal   Collection Time: 04/11/15 10:00 PM  Result Value Ref Range   O2 Content 3.0 L/min   Delivery systems NASAL CANNULA    pH, Arterial 7.422 7.350 - 7.450   pCO2 arterial 58.9 (HH) 35.0 - 45.0 mmHg    Comment: CRITICAL RESULT CALLED TO, READ BACK BY AND VERIFIED WITH: ROBBIE WAGONER,RN BY BFRETWELL RRT,RCP ON 04/11/15 AT 2210    pO2, Arterial 62.6 (L) 80.0 - 100.0 mmHg   Bicarbonate 34.5 (H) 20.0 - 24.0 mEq/L   TCO2 18.1 0 - 100 mmol/L   Acid-Base Excess 12.6 (H) 0.0 - 2.0 mmol/L   O2 Saturation 91.7 %   Collection site RIGHT RADIAL    Drawn by [id number]    Sample type ARTERIAL DRAW    Allens test (pass/fail) PASS PASS  Basic metabolic panel     Status: Abnormal   Collection Time: 04/12/15  4:04 AM  Result Value Ref Range   Sodium 138 135 - 145 mmol/L   Potassium 5.4 (H) 3.5 - 5.1 mmol/L   Chloride 94 (L) 101 - 111 mmol/L   CO2 39 (H) 22 - 32 mmol/L   Glucose, Bld 110 (H) 65 - 99 mg/dL   BUN 23 (H) 6 - 20 mg/dL   Creatinine, Ser 0.61 0.61 - 1.24 mg/dL   Calcium 8.2 (L) 8.9 - 10.3 mg/dL  GFR calc non Af Amer >60 >60 mL/min   GFR calc Af Amer >60 >60 mL/min    Comment: (NOTE) The eGFR has been calculated using the CKD EPI equation. This calculation has not been validated in all clinical situations. eGFR's persistently <60 mL/min signify possible Chronic Kidney Disease.    Anion gap 5 5 - 15  Blood gas, arterial     Status: Abnormal   Collection Time: 04/12/15  4:28 AM  Result Value Ref Range   FIO2 0.30    Delivery systems BILEVEL POSITIVE AIRWAY PRESSURE    LHR 15.0 resp/min   Inspiratory PAP 18.0    Expiratory PAP 6.0    pH, Arterial 7.369 7.350 - 7.450   pCO2 arterial 65.9 (HH) 35.0 - 45.0 mmHg    Comment: CRITICAL RESULT CALLED TO, READ BACK BY AND VERIFIED WITH: ROBBIE WAGONER,RN BY BFRETWELL RRT,RCP ON 04/12/15 AT 0439    pO2, Arterial 142 (H) 80.0 - 100.0 mmHg   Bicarbonate 33.3 (H) 20.0 - 24.0 mEq/L   TCO2 19.2 0 - 100 mmol/L   Acid-Base Excess  11.5 (H) 0.0 - 2.0 mmol/L   O2 Saturation 98.8 %   Collection site RIGHT RADIAL    Drawn by [id number]    Sample type ARTERIAL DRAW    Allens test (pass/fail) PASS PASS  Ammonia     Status: None   Collection Time: 04/12/15 10:16 AM  Result Value Ref Range   Ammonia 23 9 - 35 umol/L  TSH     Status: None   Collection Time: 04/12/15 10:16 AM  Result Value Ref Range   TSH 0.750 0.350 - 4.500 uIU/mL  RPR     Status: None   Collection Time: 04/12/15 10:16 AM  Result Value Ref Range   RPR Ser Ql Non Reactive Non Reactive    Comment: (NOTE) Performed At: [facility] [address] [place], [place] [id number] Lindon Romp MD PP:[id number]   Vitamin B12     Status: None   Collection Time: 04/12/15 10:16 AM  Result Value Ref Range   Vitamin B-12 584 180 - 914 pg/mL    Comment: (NOTE) This assay is not validated for testing neonatal or myeloproliferative syndrome specimens for Vitamin B12 levels. Performed at [facility]   HIV antibody (routine testing) (NOT for [facility])     Status: None   Collection Time: 04/12/15 10:16 AM  Result Value Ref Range   HIV Screen 4th Generation wRfx Non Reactive Non Reactive    Comment: (NOTE) Performed At: [facility] [place], [place] [id number] Lindon Romp MD XN:[id number]   Urinalysis, Routine w reflex microscopic (not at [facility])     Status: Abnormal   Collection Time: 04/12/15  4:00 PM  Result Value Ref Range   Color, Urine YELLOW YELLOW   APPearance HAZY (A) CLEAR   Specific Gravity, Urine 1.015 1.005 - 1.030   pH 7.0 5.0 - 8.0   Glucose, UA NEGATIVE NEGATIVE mg/dL   Hgb urine dipstick NEGATIVE NEGATIVE   Bilirubin Urine NEGATIVE NEGATIVE   Ketones, ur NEGATIVE NEGATIVE mg/dL   Protein, ur NEGATIVE NEGATIVE mg/dL   Nitrite NEGATIVE NEGATIVE   Leukocytes, UA NEGATIVE NEGATIVE    Comment: MICROSCOPIC NOT DONE ON URINES WITH NEGATIVE PROTEIN, BLOOD, LEUKOCYTES, NITRITE, OR GLUCOSE <1000 mg/dL.   Basic metabolic panel     Status: Abnormal   Collection Time: 04/13/15  4:44 AM  Result Value Ref Range   Sodium 137 135 -  145 mmol/L   Potassium 4.7 3.5 - 5.1 mmol/L   Chloride 94 (L) 101 - 111 mmol/L   CO2 36 (H) 22 - 32 mmol/L   Glucose, Bld 97 65 - 99 mg/dL   BUN 26 (H) 6 - 20 mg/dL   Creatinine, Ser 0.47 (L) 0.61 - 1.24 mg/dL   Calcium 8.3 (L) 8.9 - 10.3 mg/dL   GFR calc non Af Amer >60 >60 mL/min   GFR calc Af Amer >60 >60 mL/min    Comment: (NOTE) The eGFR has been calculated using the CKD EPI equation. This calculation has not been validated in all clinical situations. eGFR's persistently <60 mL/min signify possible Chronic Kidney Disease.    Anion gap 7 5 - 15  CBC     Status: None   Collection Time: 04/13/15  4:44 AM  Result Value Ref Range   WBC 8.5 4.0 - 10.5 K/uL   RBC 4.89 4.22 - 5.81 MIL/uL   Hemoglobin 14.8 13.0 - 17.0 g/dL   HCT 46.4 39.0 - 52.0 %   MCV 94.9 78.0 - 100.0 fL   MCH 30.3 26.0 - 34.0 pg   MCHC 31.9 30.0 - 36.0 g/dL   RDW 12.9 11.5 - 15.5 %   Platelets 201 150 - 400 K/uL    ABGS  Recent Labs  04/12/15 0428  PHART 7.369  PO2ART 142*  TCO2 19.2  HCO3 33.3*   CULTURES Recent Results (from the past 240 hour(s))  MRSA PCR Screening     Status: None   Collection Time: 04/05/15 10:20 PM  Result Value Ref Range Status   MRSA by PCR NEGATIVE NEGATIVE Final    Comment:        The GeneXpert MRSA Assay (FDA approved for NASAL specimens only), is one component of a comprehensive MRSA colonization surveillance program. It is not intended to diagnose MRSA infection nor to guide or monitor treatment for MRSA infections.    Studies/Results: Dg Chest Port 1 View  04/11/2015  CLINICAL DATA:  Respiratory failure, community-acquired pneumonia, Celsius OPD, current smoker. EXAM: PORTABLE CHEST 1 VIEW COMPARISON:  Portable chest x-ray of April 08, 2015 FINDINGS: The lungs are hyperinflated. The interstitial markings are coarse bilaterally.  There are no discrete air bronchograms. There is no pleural effusion or pneumothorax. The heart and pulmonary vascularity are normal. The mediastinum is normal in width. The bony thorax exhibits no acute abnormality. 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 There is no alveolar pneumonia nor CHF. Electronically Signed   By: David  Martinique M.D.   On: 04/11/2015 08:57    Medications:  Prior to Admission:  Prescriptions prior to admission  Medication Sig Dispense Refill Last Dose  . albuterol (PROVENTIL HFA;VENTOLIN HFA) 108 (90 BASE) MCG/ACT inhaler Inhale 2 puffs into the lungs every 4 (four) hours as needed for wheezing or shortness of breath. For shortness of breath   unknown  . amLODipine (NORVASC) 5 MG tablet Take 5 mg by mouth every morning.   04/05/2015 at Unknown time  . fluticasone furoate-vilanterol (BREO ELLIPTA) 100-25 MCG/INH AEPB Inhale 1 puff into the lungs daily.   04/05/2015 at Unknown time  . lisinopril (PRINIVIL,ZESTRIL) 20 MG tablet Take 20 mg by mouth every morning.    04/05/2015 at Unknown time  . mometasone-formoterol (DULERA) 100-5 MCG/ACT AERO Inhale 2 puffs into the lungs 2 (two) times daily. (Patient taking differently: Inhale 1 puff into the lungs 2 (two) times daily. ) 1 Inhaler 0 04/05/2015 at Unknown time  .  traZODone (DESYREL) 100 MG tablet Take 100 mg by mouth at bedtime.   04/04/2015 at Unknown time  . umeclidinium-vilanterol (ANORO ELLIPTA) 62.5-25 MCG/INH AEPB Inhale 1 puff into the lungs 2 (two) times daily.   04/05/2015 at Unknown time   Scheduled: . amLODipine  5 mg Oral Daily  . enoxaparin (LOVENOX) injection  40 mg Subcutaneous Q24H  . guaiFENesin  1,200 mg Oral BID  . ipratropium-albuterol  3 mL Nebulization Q4H  . lisinopril  20 mg Oral Daily  . methylPREDNISolone (SOLU-MEDROL) injection  40 mg Intravenous 3 times per day  . mometasone-formoterol  2 puff Inhalation BID  . nicotine  21 mg Transdermal Daily  . sodium chloride flush  3 mL  Intravenous Q12H   Continuous: . sodium chloride 75 mL/hr at 04/12/15 2114   EHU:DJSHFW chloride, acetaminophen **OR** acetaminophen, albuterol, alum & mag hydroxide-simeth, haloperidol lactate, hydrALAZINE, LORazepam, morphine injection, ondansetron **OR** ondansetron (ZOFRAN) IV, sodium chloride flush  Assesment: He has acute on chronic respiratory failure with hypoxia and hypercapnia. At baseline he has pretty severe COPD on home oxygen with which he is noncompliant. He has community-acquired pneumonia. He has encephalopathy but I don't think we can blame all of his encephalopathy on his respiratory issue and I think he probably has some baseline dementia which is worse being in the hospital. He is slowly improving Principal Problem:   Acute respiratory failure with hypoxia and hypercarbia (HCC) Active Problems:   Tobacco abuse   Essential hypertension   CAP (community acquired pneumonia)   Hypercapnic respiratory failure (HCC)   COPD exacerbation ([place])   Encephalopathy, metabolic    Plan: Continue current treatments. Plan for skilled care facility at discharge back off BiPAP now but he will need BiPAP when he is discharged    LOS: 8 days   Wilmer Santillo L 04/13/2015, 8:53 AM

## 2015-04-13 NOTE — Clinical Social Work Placement (Signed)
   CLINICAL SOCIAL WORK PLACEMENT  NOTE  Date:  04/13/2015  Patient Details  Name: Shane Todd MRN: [id number] Date of Birth: 1945/06/05  Clinical Social Work is seeking post-discharge placement for this patient at the Skilled  Nursing Facility level of care (*CSW will initial, date and re-position this form in  chart as items are completed):  Yes   Patient/family provided with [facility] Clinical Social Work Department's list of facilities offering this level of care within the geographic area requested by the patient (or if unable, by the patient's family).  Yes   Patient/family informed of their freedom to choose among providers that offer the needed level of care, that participate in Medicare, Medicaid or managed care program needed by the patient, have an available bed and are willing to accept the patient.  Yes   Patient/family informed of [facility]'s ownership interest in [facility] and [facility], as well as of the fact that they are under no obligation to receive care at these facilities.  PASRR submitted to EDS on 04/13/15     PASRR number received on 04/13/15     Existing PASRR number confirmed on       FL2 transmitted to all facilities in geographic area requested by pt/family on 04/13/15     FL2 transmitted to all facilities within larger geographic area on       Patient informed that his/her managed care company has contracts with or will negotiate with certain facilities, including the following:            Patient/family informed of bed offers received.  Patient chooses bed at       Physician recommends and patient chooses bed at      Patient to be transferred to   on  .  Patient to be transferred to facility by       Patient family notified on   of transfer.  Name of family member notified:        PHYSICIAN       Additional Comment:    _______________________________________________ Dede Query, LCSW 04/13/2015, 2:25 PM

## 2015-04-14 LAB — BASIC METABOLIC PANEL
ANION GAP: 7 (ref 5–15)
BUN: 28 mg/dL — ABNORMAL HIGH (ref 6–20)
CHLORIDE: 95 mmol/L — AB (ref 101–111)
CO2: 33 mmol/L — AB (ref 22–32)
Calcium: 8 mg/dL — ABNORMAL LOW (ref 8.9–10.3)
Creatinine, Ser: 0.47 mg/dL — ABNORMAL LOW (ref 0.61–1.24)
GFR calc Af Amer: >60 mL/min (ref >60)
GFR calc non Af Amer: >60 mL/min (ref >60)
GLUCOSE: 98 mg/dL (ref 65–99)
POTASSIUM: 4.1 mmol/L (ref 3.5–5.1)
Sodium: 135 mmol/L (ref 135–145)

## 2015-04-14 LAB — GLUCOSE, CAPILLARY: Glucose-Capillary: 75 mg/dL (ref 65–99)

## 2015-04-14 NOTE — Consult Note (Addendum)
   [facility] CM Inpatient Consult   04/14/2015  Shane Todd 24-Aug-1945 [id number]   Spoke with patient and wife at bedside regarding [facility] services. Patient wife given [facility] brochure and contact information for future reference.  Of note, [facility] Care Management services would not replace or interfere with any services that are arranged by inpatient case management or social work. For additional questions or referrals please contact: Alben Spittle. Albertha Ghee, RN, BSN, [facility]  [facility] Liaison [phone]

## 2015-04-14 NOTE — Progress Notes (Signed)
[facility] PROGRESS NOTE  RACHEL SAMPLES WUJ:[id number] DOB: [birth year] DOA: 04/05/2015 PCP: [name], MD Summary  70 yom with COPD and chronic respiratory failure on 2L at home presented with complaints of increased SOB, confusion, and somnolence. On admission he was found to have LLL CAP and has since completed a course of abx. He is noted to have improvement in ABG with BiPAP therapy, yet has had episodes of confusion and agitation. Pulmonology following.   Assessment/Plan: Acute on chronic respiratory failure with hypoxia and hypercarbia, secondary to CAP and COPD exacerbation. Patient was admitted on 04/05/15 for shortness of breath. Due to respiratory acidosis with a pH of 7.2, hypercarbia and hypoxia with an initial PCO2 of 83, he was started on BiPAP and oxygen and DuoNeb nebulizers. His initial chest x-ray revealed left lower lobe pneumonia. He was also noted to have bronchospasms and was diagnosed with COPD exacerbation. He was started on IV Levaquin and IV Solu-Medrol. Dr. Juanetta Gosling was consulted and has been following the patient. He advised BiPAP for the patient at home as the patient will likely need it.  -Patient's most recent ABG revealed improvement in his PCO2 which is likely at baseline ranging from 60-65. His oxygen saturations have improved on nasal cannula oxygen.  -Chest x-ray on 04/11/15 revealed stable COPD with parenchymal scarring, but no CHF or pneumonia. -He has completed a course of IV Levaquin. IV Solu-Medrol is being tapered.  It has been decreased to 40 mg IV every 8 hours. -Home BiPAP will need to be set up by case management. -Slowly improving.   Acute encephalopathy/delirium, etiology multifactorial. Patient was encephalopathic on admission, which was thought to be secondary to CO2 narcosis and acute illness. However, during the course of the hospitalization, he has become more intermittently confused, even with improvement in his PCO2. Per his  wife, she reported mental status changes at home for the past 6 months , consistent with dementia. -Investigation will studies were ordered. His UA was within normal limits; ammonia level, TSH, and vitamin B12 were within normal limits. His RPR was nonreactive. HIV was nonreactive. -CT head was canceled due to his agitation. He is more alert and oriented , so will hold off on reordering the CT of his head. -Patient had been lethargic following Ativan, but the doses  Of Ativan was decreased and the nursing staff was asked to use Haldol for agitation and delirium. Patient is more alert and oriented this morning. -Etiology of his encephalopathy is likely multifactorial including mild dementia with sundowning, possible CO2 narcosis, ICU delirium, and/or steroid-induced delirium.  Hypertension. Patient is treated with Norvasc and lisinopril chronically. Both were restarted. His blood pressure has been trending up, which may be secondary to IV steroid and agitation. - When necessary hydralazine was ordered. -His blood pressure has improved. -We'll order when necessary hydralazine and consider clonidine which will have a calming effect on his CNS.  Tobacco abuse. Patient had been requesting to go outside to smoke per nursing staff. Nicotine patch was placed. We'll order tobacco cessation counseling.  Hyperkalemia. Patient serum potassium increased to 5.4. He was given Kayexalate with improvement. -We'll continue to monitor his serum potassium on lisinopril. Consider decreasing the dose of lisinopril if serum potassium is consistently elevated.   Probable deconditioning.  We'll order PT evaluation.      Code Status: Full DVT prophylaxis: Lovenox Family Communication:  Discussed with wife and brother. Disposition Plan: Anticipate discharge in 2-3 days.    Consultants:  Pulmonology  PT -HHPT   Procedures:  BiPAP  Antibiotics:  Levquin 2/21 >>2/27  HPI/Subjective: Patient is  lethargic and opens his eyes to voice and mumbles, but I'm unable to decipher.     Objective: Filed Vitals:   04/14/15 0613 04/14/15 0826  BP: 188/81   Pulse:    Temp:  98.3 F (36.8 C)  Resp:    Vital signs on telemetry : blood pressure 116/53, heart rate 120, respiratory rate 20 , oxygen saturation 98% on nasal cannula oxygen.   Intake/Output Summary (Last 24 hours) at 04/14/15 0932 Last data filed at 04/14/15 0600  Gross per 24 hour  Intake   2355 ml  Output   2300 ml  Net     55 ml   Filed Weights   04/12/15 0500 04/13/15 0500 04/14/15 0500  Weight: 64.1 kg (141 lb 5 oz) 62.3 kg (137 lb 5.6 oz) 61.7 kg (136 lb 0.4 oz)    Exam:  General: 70 year old who is much more alert this morning. He is finishing breakfast. Cardiovascular: S1, S2, with mild tachycardia. Respiratory: Occasional upper airway crackles; rare wheeze Abdomen: soft, positive bowel sounds, soft, nontender, nondistended. Musculoskeletal: No pedal edema. No acute hot red joints. Neurologic: He is much more alert. He is oriented to  Himself, wife, brother, and hospital. No obvious acute cranial nerve deficits or changes.  Data Reviewed: Basic Metabolic Panel:  Recent Labs Lab 04/09/15 0451 04/11/15 0503 04/12/15 0404 04/13/15 0444 04/14/15 0456  NA 142 138 138 137 135  K 5.1 4.9 5.4* 4.7 4.1  CL 98* 94* 94* 94* 95*  CO2 39* 38* 39* 36* 33*  GLUCOSE 135* 108* 110* 97 98  BUN 30* 26* 23* 26* 28*  CREATININE 0.74 0.61 0.61 0.47* 0.47*  CALCIUM 8.5* 8.1* 8.2* 8.3* 8.0*   CBC:  Recent Labs Lab 04/08/15 0413 04/09/15 0451 04/11/15 0503 04/13/15 0444  WBC 11.0* 9.6 8.3 8.5  HGB 12.5* 11.8* 12.7* 14.8  HCT 40.8 39.3 40.2 46.4  MCV 101.0* 101.0* 96.9 94.9  PLT 204 198 218 201   Cardiac Enzymes: No results for input(s): CKTOTAL, CKMB, CKMBINDEX, TROPONINI in the last 168 hours. BNP (last 3 results)  Recent Labs  04/05/15 1721  BNP 170.0*     Recent Results (from the past 240 hour(s))   MRSA PCR Screening     Status: None   Collection Time: 04/05/15 10:20 PM  Result Value Ref Range Status   MRSA by PCR NEGATIVE NEGATIVE Final    Comment:        The GeneXpert MRSA Assay (FDA approved for NASAL specimens only), is one component of a comprehensive MRSA colonization surveillance program. It is not intended to diagnose MRSA infection nor to guide or monitor treatment for MRSA infections.      Studies: No results found.  Scheduled Meds: . amLODipine  5 mg Oral Daily  . enoxaparin (LOVENOX) injection  40 mg Subcutaneous Q24H  . guaiFENesin  1,200 mg Oral BID  . ipratropium-albuterol  3 mL Nebulization Q4H  . lisinopril  20 mg Oral Daily  . methylPREDNISolone (SOLU-MEDROL) injection  40 mg Intravenous 3 times per day  . mometasone-formoterol  2 puff Inhalation BID  . nicotine  21 mg Transdermal Daily  . sodium chloride flush  3 mL Intravenous Q12H   Continuous Infusions: . sodium chloride 75 mL/hr at 04/14/15 0600    Principal Problem:   Acute respiratory failure with hypoxia and hypercarbia (HCC) Active Problems:   CAP (community acquired  pneumonia)   Hypercapnic respiratory failure (HCC)   COPD exacerbation (HCC)   Tobacco abuse   Essential hypertension   Encephalopathy, metabolic   Time spent: 30 minutes  Elliot Cousin, M.D. [facility] Pager [phone] If 7PM-7AM, please contact night-coverage at [email or web address], password [facility] 04/14/2015, 9:32 AM  LOS: 9 days

## 2015-04-14 NOTE — Care Management Note (Signed)
Case Management Note  Patient Details  Name: HARCE VOLDEN MRN: [id number] Date of Birth: 07-20-1945    Expected Discharge Date:  04/18/2015               Expected Discharge Plan:  Skilled Nursing Facility  In-House Referral:  Clinical Social Work  Discharge planning Services  CM Consult  Post Acute Care Choice:  NA Choice offered to:  NA  DME Arranged:    DME Agency:     HH Arranged:    HH Agency:     Status of Service:  In process, will continue to follow  Medicare Important Message Given:    Date Medicare IM Given:    Medicare IM give by:    Date Additional Medicare IM Given:    Additional Medicare Important Message give by:    If discussed at Long Length of Stay Meetings, dates discussed:  04/14/2015  Additional Comments:  Malcolm Metro, RN 04/14/2015, 4:19 PM

## 2015-04-14 NOTE — Progress Notes (Signed)
With two person assist patient walked about 150 feet, tolerated well.

## 2015-04-14 NOTE — Progress Notes (Signed)
Subjective: He looks a little bit better. He is still confused. No new complaints.  Objective: Vital signs in last 24 hours: Temp:  [96.6 F (35.9 C)-98.3 F (36.8 C)] 98.3 F (36.8 C) (03/02 0826) Pulse Rate:  [69-119] 109 (03/02 0500) Resp:  [10-32] 26 (03/02 0500) BP: (126-188)/(66-114) 188/81 mmHg (03/02 0613) SpO2:  [84 %-98 %] 96 % (03/02 0743) Weight:  [61.7 kg (136 lb 0.4 oz)] 61.7 kg (136 lb 0.4 oz) (03/02 0500) Weight change: -0.6 kg (-1 lb 5.2 oz) Last BM Date: 04/08/15  Intake/Output from previous day: 03/01 0701 - 03/02 0700 In: 2355 [P.O.:480; I.V.:1875] Out: 2300 [Urine:2300]  PHYSICAL EXAM General appearance: alert and moderate distress Resp: rhonchi bilaterally Cardio: regular rate and rhythm, S1, S2 normal, no murmur, click, rub or gallop GI: soft, non-tender; bowel sounds normal; no masses,  no organomegaly Extremities: extremities normal, atraumatic, no cyanosis or edema  Lab Results:  Results for orders placed or performed during the hospital encounter of 04/05/15 (from the past 48 hour(s))  Ammonia     Status: None   Collection Time: 04/12/15 10:16 AM  Result Value Ref Range   Ammonia 23 9 - 35 umol/L  TSH     Status: None   Collection Time: 04/12/15 10:16 AM  Result Value Ref Range   TSH 0.750 0.350 - 4.500 uIU/mL  RPR     Status: None   Collection Time: 04/12/15 10:16 AM  Result Value Ref Range   RPR Ser Ql Non Reactive Non Reactive    Comment: (NOTE) Performed At: [facility] [place], [place] [id number] Lindon Romp MD HW:[id number]   Vitamin B12     Status: None   Collection Time: 04/12/15 10:16 AM  Result Value Ref Range   Vitamin B-12 584 180 - 914 pg/mL    Comment: (NOTE) This assay is not validated for testing neonatal or myeloproliferative syndrome specimens for Vitamin B12 levels. Performed at [facility]   HIV antibody (routine testing) (NOT for [facility])     Status: None   Collection Time:  04/12/15 10:16 AM  Result Value Ref Range   HIV Screen 4th Generation wRfx Non Reactive Non Reactive    Comment: (NOTE) Performed At: [facility] [place], [place] [id number] Lindon Romp MD ZW:[id number]   Urinalysis, Routine w reflex microscopic (not at [facility])     Status: Abnormal   Collection Time: 04/12/15  4:00 PM  Result Value Ref Range   Color, Urine YELLOW YELLOW   APPearance HAZY (A) CLEAR   Specific Gravity, Urine 1.015 1.005 - 1.030   pH 7.0 5.0 - 8.0   Glucose, UA NEGATIVE NEGATIVE mg/dL   Hgb urine dipstick NEGATIVE NEGATIVE   Bilirubin Urine NEGATIVE NEGATIVE   Ketones, ur NEGATIVE NEGATIVE mg/dL   Protein, ur NEGATIVE NEGATIVE mg/dL   Nitrite NEGATIVE NEGATIVE   Leukocytes, UA NEGATIVE NEGATIVE    Comment: MICROSCOPIC NOT DONE ON URINES WITH NEGATIVE PROTEIN, BLOOD, LEUKOCYTES, NITRITE, OR GLUCOSE <1000 mg/dL.  Basic metabolic panel     Status: Abnormal   Collection Time: 04/13/15  4:44 AM  Result Value Ref Range   Sodium 137 135 - 145 mmol/L   Potassium 4.7 3.5 - 5.1 mmol/L   Chloride 94 (L) 101 - 111 mmol/L   CO2 36 (H) 22 - 32 mmol/L   Glucose, Bld 97 65 - 99 mg/dL   BUN 26 (H) 6 - 20 mg/dL   Creatinine, Ser 0.47 (  L) 0.61 - 1.24 mg/dL   Calcium 8.3 (L) 8.9 - 10.3 mg/dL   GFR calc non Af Amer >60 >60 mL/min   GFR calc Af Amer >60 >60 mL/min    Comment: (NOTE) The eGFR has been calculated using the CKD EPI equation. This calculation has not been validated in all clinical situations. eGFR's persistently <60 mL/min signify possible Chronic Kidney Disease.    Anion gap 7 5 - 15  CBC     Status: None   Collection Time: 04/13/15  4:44 AM  Result Value Ref Range   WBC 8.5 4.0 - 10.5 K/uL   RBC 4.89 4.22 - 5.81 MIL/uL   Hemoglobin 14.8 13.0 - 17.0 g/dL   HCT 46.4 39.0 - 52.0 %   MCV 94.9 78.0 - 100.0 fL   MCH 30.3 26.0 - 34.0 pg   MCHC 31.9 30.0 - 36.0 g/dL   RDW 12.9 11.5 - 15.5 %   Platelets 201 150 - 400 K/uL  Glucose,  capillary     Status: None   Collection Time: 04/13/15  3:52 PM  Result Value Ref Range   Glucose-Capillary 75 65 - 99 mg/dL  Basic metabolic panel     Status: Abnormal   Collection Time: 04/14/15  4:56 AM  Result Value Ref Range   Sodium 135 135 - 145 mmol/L   Potassium 4.1 3.5 - 5.1 mmol/L   Chloride 95 (L) 101 - 111 mmol/L   CO2 33 (H) 22 - 32 mmol/L   Glucose, Bld 98 65 - 99 mg/dL   BUN 28 (H) 6 - 20 mg/dL   Creatinine, Ser 0.47 (L) 0.61 - 1.24 mg/dL   Calcium 8.0 (L) 8.9 - 10.3 mg/dL   GFR calc non Af Amer >60 >60 mL/min   GFR calc Af Amer >60 >60 mL/min    Comment: (NOTE) The eGFR has been calculated using the CKD EPI equation. This calculation has not been validated in all clinical situations. eGFR's persistently <60 mL/min signify possible Chronic Kidney Disease.    Anion gap 7 5 - 15    ABGS  Recent Labs  04/12/15 0428  PHART 7.369  PO2ART 142*  TCO2 19.2  HCO3 33.3*   CULTURES Recent Results (from the past 240 hour(s))  MRSA PCR Screening     Status: None   Collection Time: 04/05/15 10:20 PM  Result Value Ref Range Status   MRSA by PCR NEGATIVE NEGATIVE Final    Comment:        The GeneXpert MRSA Assay (FDA approved for NASAL specimens only), is one component of a comprehensive MRSA colonization surveillance program. It is not intended to diagnose MRSA infection nor to guide or monitor treatment for MRSA infections.    Studies/Results: No results found.  Medications:  Prior to Admission:  Prescriptions prior to admission  Medication Sig Dispense Refill Last Dose  . albuterol (PROVENTIL HFA;VENTOLIN HFA) 108 (90 BASE) MCG/ACT inhaler Inhale 2 puffs into the lungs every 4 (four) hours as needed for wheezing or shortness of breath. For shortness of breath   unknown  . amLODipine (NORVASC) 5 MG tablet Take 5 mg by mouth every morning.   04/05/2015 at Unknown time  . fluticasone furoate-vilanterol (BREO ELLIPTA) 100-25 MCG/INH AEPB Inhale 1 puff into  the lungs daily.   04/05/2015 at Unknown time  . lisinopril (PRINIVIL,ZESTRIL) 20 MG tablet Take 20 mg by mouth every morning.    04/05/2015 at Unknown time  . mometasone-formoterol (DULERA) 100-5 MCG/ACT AERO Inhale  2 puffs into the lungs 2 (two) times daily. (Patient taking differently: Inhale 1 puff into the lungs 2 (two) times daily. ) 1 Inhaler 0 04/05/2015 at Unknown time  . traZODone (DESYREL) 100 MG tablet Take 100 mg by mouth at bedtime.   04/04/2015 at Unknown time  . umeclidinium-vilanterol (ANORO ELLIPTA) 62.5-25 MCG/INH AEPB Inhale 1 puff into the lungs 2 (two) times daily.   04/05/2015 at Unknown time   Scheduled: . amLODipine  5 mg Oral Daily  . enoxaparin (LOVENOX) injection  40 mg Subcutaneous Q24H  . guaiFENesin  1,200 mg Oral BID  . ipratropium-albuterol  3 mL Nebulization Q4H  . lisinopril  20 mg Oral Daily  . methylPREDNISolone (SOLU-MEDROL) injection  40 mg Intravenous 3 times per day  . mometasone-formoterol  2 puff Inhalation BID  . nicotine  21 mg Transdermal Daily  . sodium chloride flush  3 mL Intravenous Q12H   Continuous: . sodium chloride 75 mL/hr at 04/14/15 0600   PHQ:NETUYW chloride, acetaminophen **OR** acetaminophen, albuterol, alum & mag hydroxide-simeth, haloperidol lactate, hydrALAZINE, LORazepam, morphine injection, ondansetron **OR** ondansetron (ZOFRAN) IV, sodium chloride flush  Assesment: He has acute hypercapnic hypoxic respiratory failure with community-acquired pneumonia and COPD exacerbation. He has metabolic encephalopathy some of which may be related to dementia. He is improving slowly. Principal Problem:   Acute respiratory failure with hypoxia and hypercarbia (HCC) Active Problems:   Tobacco abuse   Essential hypertension   CAP (community acquired pneumonia)   Hypercapnic respiratory failure (HCC)   COPD exacerbation (HCC)   Encephalopathy, metabolic    Plan: Agree with current treatment. I agree with Dr. Maralyn Sago assessment that he may  be ready for transfer to skilled care facility in the next 72 hours    LOS: 9 days   Tashara Suder L 04/14/2015, 9:26 AM

## 2015-04-15 DIAGNOSIS — E43 Unspecified severe protein-calorie malnutrition: Secondary | ICD-10-CM | POA: Diagnosis present

## 2015-04-15 LAB — BASIC METABOLIC PANEL
ANION GAP: 6 (ref 5–15)
BUN: 21 mg/dL — ABNORMAL HIGH (ref 6–20)
CHLORIDE: 95 mmol/L — AB (ref 101–111)
CO2: 33 mmol/L — ABNORMAL HIGH (ref 22–32)
Calcium: 7.9 mg/dL — ABNORMAL LOW (ref 8.9–10.3)
Creatinine, Ser: 0.68 mg/dL (ref 0.61–1.24)
Glucose, Bld: 159 mg/dL — ABNORMAL HIGH (ref 65–99)
POTASSIUM: 4.1 mmol/L (ref 3.5–5.1)
SODIUM: 134 mmol/L — AB (ref 135–145)

## 2015-04-15 LAB — GLUCOSE, CAPILLARY: GLUCOSE-CAPILLARY: 88 mg/dL (ref 65–99)

## 2015-04-15 MED ORDER — TRAZODONE HCL 50 MG PO TABS
25.0000 mg | ORAL_TABLET | Freq: Every day | ORAL | Status: DC
  Administered 2015-04-15 – 2015-04-17 (×3): 25 mg via ORAL
  Filled 2015-04-15 (×3): qty 1

## 2015-04-15 MED ORDER — ENSURE ENLIVE PO LIQD
237.0000 mL | Freq: Two times a day (BID) | ORAL | Status: DC
  Administered 2015-04-16 – 2015-04-18 (×4): 237 mL via ORAL

## 2015-04-15 MED ORDER — METHYLPREDNISOLONE SODIUM SUCC 40 MG IJ SOLR: 40.0000 mg | Freq: Every day | INTRAMUSCULAR | Status: DC

## 2015-04-15 MED ORDER — PREDNISONE 20 MG PO TABS
30.0000 mg | ORAL_TABLET | Freq: Every day | ORAL | Status: DC
  Administered 2015-04-16 – 2015-04-18 (×3): 30 mg via ORAL
  Filled 2015-04-15 (×3): qty 1

## 2015-04-15 MED ORDER — DILTIAZEM HCL 30 MG PO TABS
30.0000 mg | ORAL_TABLET | Freq: Four times a day (QID) | ORAL | Status: DC
  Administered 2015-04-15 – 2015-04-16 (×4): 30 mg via ORAL
  Filled 2015-04-15 (×4): qty 1

## 2015-04-15 MED ORDER — RISPERIDONE 0.5 MG PO TABS
0.2500 mg | ORAL_TABLET | Freq: Every day | ORAL | Status: DC
  Administered 2015-04-15 – 2015-04-17 (×3): 0.25 mg via ORAL
  Filled 2015-04-15 (×3): qty 1

## 2015-04-15 NOTE — Progress Notes (Signed)
Initial Nutrition Assessment  DOCUMENTATION CODES:   Severe malnutrition in context of acute illness/injury  INTERVENTION:  Ensure Enlive po BID, each supplement provides 350 kcal and 20 grams of protein   Add vanilla ice cream to each for increased caloric benefit  NUTRITION DIAGNOSIS:   Malnutrition related to acute illness as evidenced by per patient/family report, energy intake < or equal to 50% for > or equal to 5 days, moderate depletion of body fat.    GOAL:   Patient will meet greater than or equal to 90% of their needs, Weight gain    MONITOR:   PO intake, Supplement acceptance, Weight trends, Labs  REASON FOR ASSESSMENT: Length of stay      ASSESSMENT:   Shane Todd A Swantek is a 70 y.o. male with a history of COPD on 2 liters of NCO2 at home who was brought to the ED due to increased SOB , and confusion then with increased somnolence over the past 2 days. He was evaluated in the ED and was found to have an ABG with a PH of 7.2, and an initial pCO2 of 83 and he was placed on BiPAP. He was also found to have a LLL pneumonia and was placed on IV Levaquin.   Pt sitting up in bed and his wife is here. He is able to answer questions but she provided most of the hx. He was eating well last Wednesday and Thursday 75-100% but then according to wife from Friday to Wednesday of this week he was sedated and slept most of the time with little to  no po intake. Yesterday and today he has been more awake and able to eat 25-50% of meals. However she says his brother reported that pt vomited today after lunch 2 times and then again after eating ice cream. Concern for constipation given no BM since 2/24.  [place] he has not really fully recovered his appetite since then. His weight is down 15% since August and 9% since he was admitted 10 days ago.  Nutrition focused exam findings: moderate to severe loss of muscle and fat mass.    Abnormal labs: sodium 134, Glu 159 and BUN 21  Diet Order:  Diet regular Room service appropriate?: Yes; Fluid consistency:: Thin  Skin:   within desired limits  Last BM:   NO BM since 2/24. Recommend consider placing pt on a bowel regimen he has a hx of constipation.  Height:   Ht Readings from Last 1 Encounters:  04/05/15 6\' 1"  (1.854 m)    Weight:   Wt Readings from Last 1 Encounters:  04/14/15 136 lb 0.4 oz (61.7 kg)    Ideal Body Weight:  83.6 kg  BMI:  Body mass index is 17.95 kg/(m^2).  Estimated Nutritional Needs:   Kcal:  [id number]  Protein:  93-111 gr  Fluid:  1.9-2.1 liters  EDUCATION NEEDS:   Education needs addressed  Royann ShiversLynn Zahniya Zellars MS,RD,CSG,LDN Office: [phone] Pager: [phone]

## 2015-04-15 NOTE — Progress Notes (Signed)
**Note De-identified  Obfuscation** EKG results reported to RN 

## 2015-04-15 NOTE — Clinical Social Work Note (Signed)
[facility] states that they will be able to accept the patient. The facility states that they are unable to do a Saturday admission but they can do a Sunday admission if the patient is ready for DC over the weekend. CSW has contacted the patient's wife to inform of bed offer. Tami will reach out to the patient's wife to set up time to complete paperwork. CSW will leave report for weekend CSW.   Roddie McBryant Charlea Nardo MSW, [place]CS[place], [place], [phone]

## 2015-04-15 NOTE — Care Management Important Message (Signed)
Important Message  Patient Details  Name: Shane Todd MRN: [id number] Date of Birth: 07/03/1945   Medicare Important Message Given:  Yes    Malcolm MetroChildress, Evelena Masci Demske, RN 04/15/2015, 4:46 PM

## 2015-04-15 NOTE — Progress Notes (Signed)
He overall looks about the same. He is on nasal oxygen now. His chest shows decreased breath sounds but is clear. He is still very confused. He is set for nursing home rehabilitation after hospitalization. I'm going to be out of town for the next 2 days and will return on the sixth. I will plan to sign off at this point but if my help is needed when I return please let me know

## 2015-04-15 NOTE — Progress Notes (Signed)
Patient up in the recliner for long period tolerated well. Wife at bedside.

## 2015-04-15 NOTE — Clinical Social Work Placement (Signed)
   CLINICAL SOCIAL WORK PLACEMENT  NOTE  Date:  04/15/2015  Patient Details  Name: Shane Todd A Douthitt MRN: [id number] Date of Birth: 15-Oct-1945  Clinical Social Work is seeking post-discharge placement for this patient at the Skilled  Nursing Facility level of care (*CSW will initial, date and re-position this form in  chart as items are completed):  Yes   Patient/family provided with [facility] Clinical Social Work Department's list of facilities offering this level of care within the geographic area requested by the patient (or if unable, by the patient's family).  Yes   Patient/family informed of their freedom to choose among providers that offer the needed level of care, that participate in Medicare, Medicaid or managed care program needed by the patient, have an available bed and are willing to accept the patient.  Yes   Patient/family informed of 's ownership interest [facility] and [facility], as well as of the fact that they are under no obligation to receive care at these facilities.  PASRR submitted to EDS on 04/13/15     PASRR number received on 04/13/15     Existing PASRR number confirmed on       FL2 transmitted to all facilities in geographic area requested by pt/family on 04/13/15     FL2 transmitted to all facilities within larger geographic area on       Patient informed that his/her managed care company has contracts with or will negotiate with certain facilities, including the following:        Yes   Patient/family informed of bed offers received.  [facility]     Physician recommends and patient chooses bed at      [facility] on  .  Patient to be transferred to facility by       Patient family notified on   of transfer.  Name of family member notified:        PHYSICIAN Please prepare priority discharge summary, including medications, Please prepare prescriptions,  Please sign FL2     Additional Comment:   [facility] is able to accept the patient on Sunday 3/5 if the patient is stable for a weekend DC. Facility and wife aware of this plan and both are agreeable. Report will be left for weekend CSW. Weekend CSW can be reached at [phone] or [phone]. _______________________________________________ Venita Lickampbell, Giuliano Preece B, LCSW 04/15/2015, 4:34 PM

## 2015-04-15 NOTE — Progress Notes (Signed)
[facility] PROGRESS NOTE  TSUTOMU BARFOOT RUE:[id number] DOB: 1945-07-07 DOA: 04/05/2015 PCP: [name], MD Summary  16 yom with COPD and chronic respiratory failure on 2L at home presented with complaints of increased SOB, confusion, and somnolence. On admission he was found to have LLL CAP and has since completed a course of abx. He is noted to have improvement in ABG with BiPAP therapy, yet has had episodes of confusion and agitation. Pulmonology following.   Assessment/Plan: Acute on chronic respiratory failure with hypoxia and hypercarbia, secondary to CAP and COPD exacerbation. Patient was admitted on 04/05/15 for shortness of breath. Due to respiratory acidosis with a pH of 7.2, hypercarbia and hypoxia with an initial PCO2 of 83, he was started on BiPAP and oxygen and DuoNeb nebulizers. His initial chest x-ray revealed left lower lobe pneumonia. He was also noted to have bronchospasms and was diagnosed with COPD exacerbation. He was started on IV Levaquin and IV Solu-Medrol. Dr. Juanetta Gosling was consulted and has been following the patient. He advised BiPAP for the patient at home as the patient will likely need it.  -Patient's most recent ABG revealed improvement in his PCO2 which is likely at baseline ranging from 60-65. His oxygen saturations have improved on nasal cannula oxygen.  -Chest x-ray on 04/11/15 revealed stable COPD with parenchymal scarring, but no CHF or pneumonia. -He has completed a course of IV Levaquin. IV Solu-Medrol is being tapered off. Will start short course of prednisone.  -Home BiPAP will need to be set up by case management. -Slowly improving.   Acute encephalopathy/delirium, etiology multifactorial. Patient was encephalopathic on admission, which was thought to be secondary to CO2 narcosis and acute illness. However, during the course of the hospitalization, he has become more intermittently confused, even with improvement in his PCO2. Per his wife,  she reported mental status changes at home for the past 6 months , consistent with dementia. -Investigation will studies were ordered. His UA was within normal limits; ammonia level, TSH, and vitamin B12 were within normal limits. His RPR was nonreactive. HIV was nonreactive. -CT head was canceled due to his agitation. He is more alert and oriented , so will hold off on reordering the CT of his head. -Patient had been lethargic following Ativan, but the doses  Of Ativan was decreased and the nursing staff was asked to use Haldol for agitation and delirium. Patient is still intermittently confused and agitated, but overall less so. -Etiology of his encephalopathy is likely multifactorial including mild dementia with sundowning, possible CO2 narcosis, ICU delirium, and/or steroid-induced delirium. -We'll add daily at bedtime Risperdal and trazodone.  Hypertension with tachycardia. Patient is treated with Norvasc and lisinopril chronically. Both were restarted. His blood pressure has been trending up, which may be secondary to IV steroid and agitation. -IV hydralazine ordered as needed. -We'll discontinue amlodipine and start diltiazem which will help with his heart rate and blood pressure.  Tobacco abuse. Patient had been requesting to go outside to smoke per nursing staff. Nicotine patch was placed. He was counseled on tobacco cessation.  Hyperkalemia. Patient serum potassium increased to 5.4. He was given Kayexalate with improvement. -We'll continue to monitor his serum potassium on lisinopril. Consider decreasing the dose of lisinopril if serum potassium is consistently elevated.   Probable deconditioning.  PT evaluation ordered.      Code Status: Full DVT prophylaxis: Lovenox Family Communication:  Discussed with wife and brother on 04/14/15. Disposition Plan: Anticipate discharge in 2-3 days.  Consultants:  Pulmonology  PT -HHPT    Procedures:  BiPAP  Antibiotics:  Levquin 2/21 >>2/27  HPI/Subjective: Patient is alert, but is time to get out of the bed. He denies shortness of breath or chest pain.   Objective: Filed Vitals:   04/15/15 1000 04/15/15 1100  BP: 154/87 175/89  Pulse: 111 101  Temp:    Resp: 18 28   oxygen saturation 98% on nasal cannula oxygen.   Intake/Output Summary (Last 24 hours) at 04/15/15 1115 Last data filed at 04/15/15 0600  Gross per 24 hour  Intake   1800 ml  Output   3150 ml  Net  -1350 ml   Filed Weights   04/12/15 0500 04/13/15 0500 04/14/15 0500  Weight: 64.1 kg (141 lb 5 oz) 62.3 kg (137 lb 5.6 oz) 61.7 kg (136 lb 0.4 oz)    Exam:  General: 70 year old who is alert, but confused trying to get out of bed.  Cardiovascular: S1, S2, with mild tachycardia. Respiratory: Occasional upper airway crackles; rare wheeze Abdomen: soft, positive bowel sounds, soft, nontender, nondistended. Musculoskeletal: No pedal edema. No acute hot red joints. Neurologic: He is alert and oriented to himself and hospital. He appears to answer questions appropriately, but tries to get out of bed.   Data Reviewed: Basic Metabolic Panel:  Recent Labs Lab 04/11/15 0503 04/12/15 0404 04/13/15 0444 04/14/15 0456 04/15/15 0934  NA 138 138 137 135 134*  K 4.9 5.4* 4.7 4.1 4.1  CL 94* 94* 94* 95* 95*  CO2 38* 39* 36* 33* 33*  GLUCOSE 108* 110* 97 98 159*  BUN 26* 23* 26* 28* 21*  CREATININE 0.61 0.61 0.47* 0.47* 0.68  CALCIUM 8.1* 8.2* 8.3* 8.0* 7.9*   CBC:  Recent Labs Lab 04/09/15 0451 04/11/15 0503 04/13/15 0444  WBC 9.6 8.3 8.5  HGB 11.8* 12.7* 14.8  HCT 39.3 40.2 46.4  MCV 101.0* 96.9 94.9  PLT 198 218 201   Cardiac Enzymes: No results for input(s): CKTOTAL, CKMB, CKMBINDEX, TROPONINI in the last 168 hours. BNP (last 3 results)  Recent Labs  04/05/15 1721  BNP 170.0*     Recent Results (from the past 240 hour(s))  MRSA PCR Screening     Status: None    Collection Time: 04/05/15 10:20 PM  Result Value Ref Range Status   MRSA by PCR NEGATIVE NEGATIVE Final    Comment:        The GeneXpert MRSA Assay (FDA approved for NASAL specimens only), is one component of a comprehensive MRSA colonization surveillance program. It is not intended to diagnose MRSA infection nor to guide or monitor treatment for MRSA infections.      Studies: No results found.  Scheduled Meds: . amLODipine  5 mg Oral Daily  . enoxaparin (LOVENOX) injection  40 mg Subcutaneous Q24H  . guaiFENesin  1,200 mg Oral BID  . ipratropium-albuterol  3 mL Nebulization Q4H  . lisinopril  20 mg Oral Daily  . methylPREDNISolone (SOLU-MEDROL) injection  40 mg Intravenous 3 times per day  . mometasone-formoterol  2 puff Inhalation BID  . nicotine  21 mg Transdermal Daily  . sodium chloride flush  3 mL Intravenous Q12H   Continuous Infusions: . sodium chloride 75 mL/hr at 04/15/15 1610    Principal Problem:   Acute respiratory failure with hypoxia and hypercarbia (HCC) Active Problems:   CAP (community acquired pneumonia)   Hypercapnic respiratory failure (HCC)   COPD exacerbation (HCC)   Tobacco abuse   Essential hypertension  Encephalopathy, metabolic   Time spent: 30 minutes  Elliot Cousinenise Tyiana Hill, M.D. [facility] Pager [phone] If 7PM-7AM, please contact night-coverage at [email or web address], password [email or web address] 04/15/2015, 11:15 AM  LOS: 10 days

## 2015-04-16 ENCOUNTER — Inpatient Hospital Stay (HOSPITAL_COMMUNITY): Payer: Medicare Other

## 2015-04-16 DIAGNOSIS — E43 Unspecified severe protein-calorie malnutrition: Secondary | ICD-10-CM

## 2015-04-16 LAB — BASIC METABOLIC PANEL
Anion gap: 3 — ABNORMAL LOW (ref 5–15)
BUN: 22 mg/dL — AB (ref 6–20)
CALCIUM: 8.1 mg/dL — AB (ref 8.9–10.3)
CHLORIDE: 95 mmol/L — AB (ref 101–111)
CO2: 37 mmol/L — ABNORMAL HIGH (ref 22–32)
CREATININE: 0.57 mg/dL — AB (ref 0.61–1.24)
GFR calc Af Amer: >60 mL/min (ref >60)
GLUCOSE: 73 mg/dL (ref 65–99)
POTASSIUM: 4.1 mmol/L (ref 3.5–5.1)
Sodium: 135 mmol/L (ref 135–145)

## 2015-04-16 LAB — LIPASE, BLOOD: Lipase: 28 U/L (ref 11–51)

## 2015-04-16 MED ORDER — BISACODYL 10 MG RE SUPP
10.0000 mg | Freq: Every day | RECTAL | Status: DC | PRN
  Administered 2015-04-16: 10 mg via RECTAL
  Filled 2015-04-16: qty 1

## 2015-04-16 MED ORDER — FAMOTIDINE IN NACL 20-0.9 MG/50ML-% IV SOLN
20.0000 mg | Freq: Two times a day (BID) | INTRAVENOUS | Status: DC
  Administered 2015-04-16 – 2015-04-18 (×5): 20 mg via INTRAVENOUS
  Filled 2015-04-16 (×7): qty 50

## 2015-04-16 MED ORDER — HALOPERIDOL LACTATE 5 MG/ML IJ SOLN
1.0000 mg | INTRAMUSCULAR | Status: DC | PRN
  Administered 2015-04-16: 4 mg via INTRAVENOUS
  Administered 2015-04-17: 2 mg via INTRAVENOUS
  Administered 2015-04-17: 4 mg via INTRAVENOUS
  Filled 2015-04-16 (×3): qty 1

## 2015-04-16 MED ORDER — DILTIAZEM HCL 30 MG PO TABS
30.0000 mg | ORAL_TABLET | Freq: Three times a day (TID) | ORAL | Status: DC
  Administered 2015-04-16 – 2015-04-18 (×6): 30 mg via ORAL
  Filled 2015-04-16 (×6): qty 1

## 2015-04-16 MED ORDER — SENNOSIDES-DOCUSATE SODIUM 8.6-50 MG PO TABS
1.0000 | ORAL_TABLET | Freq: Two times a day (BID) | ORAL | Status: DC
  Administered 2015-04-16 – 2015-04-18 (×5): 1 via ORAL
  Filled 2015-04-16 (×5): qty 1

## 2015-04-16 NOTE — Progress Notes (Signed)
eLink Physician-Brief Progress Note Patient Name: Shane Todd DOB: 1945/02/25 MRN: [id number]   Date of Service  04/16/2015  HPI/Events of Note  delerium  eICU Interventions  Dc ativan Increase haldol 1-4 q 3h prn     Intervention Category Major Interventions: Delirium, psychosis, severe agitation - evaluation and management  Tyronn Golda V. 04/16/2015, 10:04 PM

## 2015-04-16 NOTE — Progress Notes (Signed)
Coughing noted when patient attempting to consume jello and other liquids. Informed family to hold off feeding patient at this time. No vomiting noted since Zofran given.

## 2015-04-16 NOTE — Progress Notes (Signed)
[facility] PROGRESS NOTE  Shane A EFOUNTAIN [id number] DOB: 07/11/1945 DOA: 04/05/2015 PCP: [name], MD Summary  48 yom with COPD and chronic respiratory failure on 2L at home presented with complaints of increased SOB, confusion, and somnolence. On admission he was found to have LLL CAP and has since completed a course of abx. He is noted to have improvement in ABG with BiPAP therapy, yet has had episodes of confusion and agitation. Pulmonology following.   Assessment/Plan: Acute on chronic respiratory failure with hypoxia and hypercarbia, secondary to CAP and COPD exacerbation. Patient was admitted on 04/05/15 for shortness of breath. Due to respiratory acidosis with a pH of 7.2, hypercarbia and hypoxia with an initial PCO2 of 83, he was started on BiPAP and oxygen and DuoNeb nebulizers. His initial chest x-ray revealed left lower lobe pneumonia. He was also noted to have bronchospasms and was diagnosed with COPD exacerbation. He was started on IV Levaquin and IV Solu-Medrol. Dr. Juanetta Gosling was consulted and has been following the patient. He advised BiPAP for the patient at home as the patient will likely need it.  -Patient's most recent ABG revealed improvement in his PCO2 which is likely at baseline ranging from 60-65. His oxygen saturations have improved on nasal cannula oxygen.  -Chest x-ray on 04/11/15 revealed stable COPD with parenchymal scarring, but no CHF or pneumonia. -He has completed a course of IV Levaquin. IV Solu-Medrol is being tapered off. Short course of prednisone.  -Home BiPAP was recommended by Dr. Juanetta Gosling, but the patient has been tolerating being off of BiPAP for the last couple nights. -He appears to be improving from a respiratory standpoint. We'll check a follow-up chest x-ray.  Acute encephalopathy/delirium, etiology multifactorial. Patient was encephalopathic on admission, which was thought to be secondary to CO2 narcosis and acute illness. However,  during the course of the hospitalization, he has become more intermittently confused, even with improvement in his PCO2. Per his wife, she reported mental status changes at home for the past 6 months , consistent with dementia. -Investigation will studies were ordered. His UA was within normal limits; ammonia level, TSH, and vitamin B12 were within normal limits. His RPR was nonreactive. HIV was nonreactive. -CT head was canceled due to his agitation. He is more alert and oriented , so will hold off on reordering the CT of his head. -Patient had been lethargic following Ativan.  Ativan was decreased and the nursing staff was asked to use Haldol for agitation and delirium. Patient is still intermittently confused and agitated, but overall less so. -Etiology of his encephalopathy is likely multifactorial including mild dementia with sundowning, possible CO2 narcosis, ICU delirium, and/or steroid-induced delirium. -Bedtime Risperdal and trazodone were added on 04/15/15.  Hypertension with tachycardia. Patient is treated with Norvasc and lisinopril chronically. Both were restarted. His blood pressure had been trending up, which may have been secondary to IV steroid and agitation. -IV hydralazine ordered as needed. -Amlodipine was discontinued and diltiazem was started which will help with his heart rate and blood pressure.  Tobacco abuse. Patient had been requesting to go outside to smoke per nursing staff. Nicotine patch was placed. He was counseled on tobacco cessation.  Hyperkalemia. Patient serum potassium increased to 5.4. He was given Kayexalate with improvement. -We'll continue to monitor his serum potassium on lisinopril. Consider decreasing the dose of lisinopril if serum potassium is consistently elevated.  Nausea and vomiting. Nursing reports that patient was given a hamburger and french fries last night by his family.  He had an episode of vomiting last night and again this morning. Patient  has not had a bowel movement in several days. -We will downgrade his diet to full liquids. Continue when necessary Zofran. Will add IV Pepcid. Will give a Dulcolax suppository. Will order a KUB and lipase.   Probable deconditioning.  PT evaluation ordered.      Code Status: Full DVT prophylaxis: Lovenox Family Communication:  Discussed with wife and brother on 04/14/15. Disposition Plan: Anticipate discharge to SNF over the next 2-3 days.    Consultants:  Pulmonology  PT -HHPT   Procedures:  BiPAP  Antibiotics:  Levquin 2/21 >>2/27  HPI/Subjective: Patient is alert. He acknowledges nausea, but denies abdominal pain. Nursing reports an episode of emesis last night and this morning after attempting breakfast.  Objective: Filed Vitals:   04/16/15 0800 04/16/15 0900  BP: 108/61 105/51  Pulse: 92   Temp:    Resp: 20 22   oxygen saturation 94% on nasal cannula oxygen. Temperature 97.3.  Intake/Output Summary (Last 24 hours) at 04/16/15 0925 Last data filed at 04/16/15 0924  Gross per 24 hour  Intake   1725 ml  Output   1350 ml  Net    375 ml   Filed Weights   04/12/15 0500 04/13/15 0500 04/14/15 0500  Weight: 64.1 kg (141 lb 5 oz) 62.3 kg (137 lb 5.6 oz) 61.7 kg (136 lb 0.4 oz)    Exam:  General: 70 year old who is alert, no acute distress.  Cardiovascular: S1, S2, with soft systolic murmur. Respiratory: Occasional upper airway crackles, partially cleared with coughing. Abdomen: soft, positive bowel sounds, soft, mild epigastric tenderness. Musculoskeletal: No pedal edema. No acute hot red joints. Neurologic: He is alert and oriented to himself and hospital. He appears to answer questions appropriately. His speech is clear.   Data Reviewed: Basic Metabolic Panel:  Recent Labs Lab 04/12/15 0404 04/13/15 0444 04/14/15 0456 04/15/15 0934 04/16/15 0424  NA 138 137 135 134* 135  K 5.4* 4.7 4.1 4.1 4.1  CL 94* 94* 95* 95* 95*  CO2 39* 36* 33* 33* 37*   GLUCOSE 110* 97 98 159* 73  BUN 23* 26* 28* 21* 22*  CREATININE 0.61 0.47* 0.47* 0.68 0.57*  CALCIUM 8.2* 8.3* 8.0* 7.9* 8.1*   CBC:  Recent Labs Lab 04/11/15 0503 04/13/15 0444  WBC 8.3 8.5  HGB 12.7* 14.8  HCT 40.2 46.4  MCV 96.9 94.9  PLT 218 201   Cardiac Enzymes: No results for input(s): CKTOTAL, CKMB, CKMBINDEX, TROPONINI in the last 168 hours. BNP (last 3 results)  Recent Labs  04/05/15 1721  BNP 170.0*     No results found for this or any previous visit (from the past 240 hour(s)).   Studies: No results found.  Scheduled Meds: . diltiazem  30 mg Oral 4 times per day  . enoxaparin (LOVENOX) injection  40 mg Subcutaneous Q24H  . feeding supplement (ENSURE ENLIVE)  237 mL Oral BID BM  . guaiFENesin  1,200 mg Oral BID  . ipratropium-albuterol  3 mL Nebulization Q4H  . lisinopril  20 mg Oral Daily  . mometasone-formoterol  2 puff Inhalation BID  . nicotine  21 mg Transdermal Daily  . predniSONE  30 mg Oral Q breakfast  . risperiDONE  0.25 mg Oral QHS  . senna-docusate  1 tablet Oral BID  . sodium chloride flush  3 mL Intravenous Q12H  . traZODone  25 mg Oral QHS   Continuous Infusions: . sodium chloride  10 mL/hr (04/16/15 0746)    Principal Problem:   Acute respiratory failure with hypoxia and hypercarbia (HCC) Active Problems:   CAP (community acquired pneumonia)   Hypercapnic respiratory failure (HCC)   COPD exacerbation (HCC)   Tobacco abuse   Essential hypertension   Encephalopathy, metabolic   Protein-calorie malnutrition, severe   Time spent: 30 minutes  Elliot Cousinenise Kassy Mcenroe, M.D. [facility] Pager [phone] If 7PM-7AM, please contact night-coverage at [email or web address], password [email or web address] 04/16/2015, 9:25 AM  LOS: 11 days

## 2015-04-17 LAB — CBC
HEMATOCRIT: 36.6 % — AB (ref 39.0–52.0)
HEMOGLOBIN: 11.7 g/dL — AB (ref 13.0–17.0)
MCH: 30.8 pg (ref 26.0–34.0)
MCHC: 32 g/dL (ref 30.0–36.0)
MCV: 96.3 fL (ref 78.0–100.0)
Platelets: 119 10*3/uL — ABNORMAL LOW (ref 150–400)
RBC: 3.8 MIL/uL — AB (ref 4.22–5.81)
RDW: 13.3 % (ref 11.5–15.5)
WBC: 8.5 10*3/uL (ref 4.0–10.5)

## 2015-04-17 LAB — COMPREHENSIVE METABOLIC PANEL
ALBUMIN: 2.5 g/dL — AB (ref 3.5–5.0)
ALK PHOS: 44 U/L (ref 38–126)
ALT: 28 U/L (ref 17–63)
ANION GAP: 5 (ref 5–15)
AST: 29 U/L (ref 15–41)
BILIRUBIN TOTAL: 0.9 mg/dL (ref 0.3–1.2)
BUN: 21 mg/dL — ABNORMAL HIGH (ref 6–20)
CALCIUM: 7.9 mg/dL — AB (ref 8.9–10.3)
CO2: 35 mmol/L — ABNORMAL HIGH (ref 22–32)
Chloride: 94 mmol/L — ABNORMAL LOW (ref 101–111)
Creatinine, Ser: 0.57 mg/dL — ABNORMAL LOW (ref 0.61–1.24)
GFR calc non Af Amer: >60 mL/min (ref >60)
GLUCOSE: 70 mg/dL (ref 65–99)
POTASSIUM: 3.7 mmol/L (ref 3.5–5.1)
SODIUM: 134 mmol/L — AB (ref 135–145)
TOTAL PROTEIN: 4.4 g/dL — AB (ref 6.5–8.1)

## 2015-04-17 NOTE — Progress Notes (Signed)
Called report to nurse who will be taking patient in room 314. Also reminded her that patient should be with sitter on floor.

## 2015-04-17 NOTE — Progress Notes (Signed)
[facility] PROGRESS NOTE  Liana GeroldMack A Lyn QMV:[id number] DOB: Sep 13, 1945 DOA: 04/05/2015 PCP: Trinna PostKOBERLEIN, JUNELL CAROL, MD Summary  [age] yom with COPD and chronic respiratory failure on 2L at home presented with complaints of increased SOB, confusion, and somnolence. On admission he was found to have LLL CAP and has since completed a course of abx. He is noted to have improvement in ABG with BiPAP therapy, yet has had episodes of confusion and agitation. Pulmonology following.   Assessment/Plan: Acute on chronic respiratory failure with hypoxia and hypercarbia, secondary to CAP and COPD exacerbation. Patient was admitted on 04/05/15 for shortness of breath. Due to respiratory acidosis with a pH of 7.2, hypercarbia and hypoxia with an initial PCO2 of 83, he was started on BiPAP and oxygen and DuoNeb nebulizers. His initial chest x-ray revealed left lower lobe pneumonia. He was also noted to have bronchospasms and was diagnosed with COPD exacerbation. He was started on IV Levaquin and IV Solu-Medrol. Dr. Juanetta GoslingHawkins was consulted and has been following the patient. He advised BiPAP for the patient at home as the patient will likely need it.  -Patient's most recent ABG revealed improvement in his PCO2 which is likely at baseline ranging from 60-65. His oxygen saturations have improved on nasal cannula oxygen.  -Chest x-ray on 04/11/15 revealed stable COPD with parenchymal scarring, but no CHF or pneumonia. -He has completed a course of IV Levaquin. IV Solu-Medrol is being tapered off. Short course of prednisone.  -Home BiPAP was recommended by Dr. Juanetta GoslingHawkins, but the patient has been tolerating being off of BiPAP for the last couple nights. -He appears to be improving from a respiratory standpoint. Follow-up chest x-ray on 04/16/15 revealed interstitial prominence bilaterally which is stable; no acute abnormalities seen.  Acute encephalopathy/delirium, etiology multifactorial. Patient was encephalopathic on  admission, which was thought to be secondary to CO2 narcosis and acute illness. However, during the course of the hospitalization, he has become more intermittently confused, even with improvement in his PCO2. Per his wife, she reported mental status changes at home for the past 6 months , consistent with dementia. -Investigation will studies were ordered. His UA was within normal limits; ammonia level, TSH, and vitamin B12 were within normal limits. His RPR was nonreactive. HIV was nonreactive. -CT head was canceled due to his agitation. He is more alert and oriented , so will hold off on reordering the CT of his head. -Patient had been lethargic following Ativan.  Ativan was discontinued. Haldol for agitation and delirium ordered and given when necessary. Patient is still intermittently confused and agitated, but overall less so. He can be easily directed without use of Haldol at times per nursing. -Etiology of his encephalopathy is likely multifactorial including mild dementia with sundowning, possible CO2 narcosis, ICU delirium, and/or steroid-induced delirium. -Bedtime Risperdal and trazodone were started on 04/15/15.  Hypertension with tachycardia. Patient is treated with Norvasc and lisinopril chronically. Both were restarted. His blood pressure had been trending up, which may have been secondary to IV steroid and agitation. -IV hydralazine ordered as needed. -Amlodipine was discontinued and diltiazem was started which has helped with his heart rate and blood pressure.  Tobacco abuse. Patient had been requesting to go outside to smoke per nursing staff. Nicotine patch was placed. He was counseled on tobacco cessation.  Hyperkalemia. Patient serum potassium increased to 5.4. He was given Kayexalate with improvement. -We'll continue to monitor his serum potassium on lisinopril. Consider decreasing the dose of lisinopril if serum potassium is consistently elevated.  Nausea and vomiting,  possibly secondary to constipation. Nursing reports that patient was given a hamburger and french fries a couple of nights ago by his family. He had 2 episodes of vomiting afterwards. Patient has not had a bowel movement in several days. -His diet was downgraded. He was started on IV Pepcid. Lipase and LFTs were within normal limits. KUB revealed fecal loading. Patient was given suppository and started on Senokot S. He has had 2 bowel movements. -No further nausea or vomiting.   Probable deconditioning.  PT evaluation ordered.      Code Status: Full DVT prophylaxis: Lovenox Family Communication:  Discussed with wife and brother on 04/14/15. Disposition Plan: Anticipate discharge to SNF over the next 2-3 days.    Consultants:  Pulmonology  PT -HHPT   Procedures:  BiPAP  Antibiotics:  Levquin 2/21 >>2/27  HPI/Subjective: Patient is alert, but appears disheveled. Nursing reports no need for Haldol overnight as he was redirected for agitation. He has been off of BiPAP for the last several nights.  Objective: Filed Vitals:   04/17/15 0600 04/17/15 0700  BP: 118/55   Pulse: 69 87  Temp:    Resp: 14 28   oxygen saturation 94 percent on nasal cannula oxygen. Temperature 97.1.  Intake/Output Summary (Last 24 hours) at 04/17/15 0907 Last data filed at 04/17/15 0500  Gross per 24 hour  Intake 479.83 ml  Output    900 ml  Net -420.17 ml   Filed Weights   04/13/15 0500 04/14/15 0500 04/17/15 0500  Weight: 62.3 kg (137 lb 5.6 oz) 61.7 kg (136 lb 0.4 oz) 60.9 kg (134 lb 4.2 oz)    Exam:  General: 70 year old who is alert, but disheveled appearing, in no acute distress.  Cardiovascular: S1, S2, with soft systolic murmur. Respiratory: Clear anteriorly with decreased breath sounds in the bases. Abdomen: soft, positive bowel sounds, soft, mild epigastric tenderness. Musculoskeletal: No pedal edema. No acute hot red joints. Neurologic: He is alert and oriented to himself and  hospital. He appears disheveled as if he has not had enough sleep. He answers questions appropriately.   Data Reviewed: Basic Metabolic Panel:  Recent Labs Lab 04/13/15 0444 04/14/15 0456 04/15/15 0934 04/16/15 0424 04/17/15 0459  NA 137 135 134* 135 134*  K 4.7 4.1 4.1 4.1 3.7  CL 94* 95* 95* 95* 94*  CO2 36* 33* 33* 37* 35*  GLUCOSE 97 98 159* 73 70  BUN 26* 28* 21* 22* 21*  CREATININE 0.47* 0.47* 0.68 0.57* 0.57*  CALCIUM 8.3* 8.0* 7.9* 8.1* 7.9*   CBC:  Recent Labs Lab 04/11/15 0503 04/13/15 0444 04/17/15 0459  WBC 8.3 8.5 8.5  HGB 12.7* 14.8 11.7*  HCT 40.2 46.4 36.6*  MCV 96.9 94.9 96.3  PLT 218 201 119*   Cardiac Enzymes: No results for input(s): CKTOTAL, CKMB, CKMBINDEX, TROPONINI in the last 168 hours. BNP (last 3 results)  Recent Labs  04/05/15 1721  BNP 170.0*     No results found for this or any previous visit (from the past 240 hour(s)).   Studies: Dg Abd 1 View  04/16/2015  CLINICAL DATA:  Vomiting.  Nausea. EXAM: ABDOMEN - 1 VIEW COMPARISON:  None. FINDINGS: Mild atelectasis in left lung base. No free air, portal venous gas, or pneumatosis. Fecal loading is seen in the distal transverse colon and proximal descending colon. The bowel gas pattern is otherwise normal. There is no evidence of obstruction. A calcification in the right upper quadrant may be a renal  stone. No other significant abnormalities on today's study. IMPRESSION: Possible right renal stone. Fecal loading in the colon. No other acute abnormalities. Electronically Signed   By: Gerome Sam III M.D   On: 04/16/2015 13:06   Dg Chest Port 1 View  04/16/2015  CLINICAL DATA:  Chest congestion. EXAM: PORTABLE CHEST 1 VIEW COMPARISON:  April 11, 2015 FINDINGS: No pneumothorax. The heart, hila, and mediastinum are unchanged. Minimal blunting of the right costophrenic angle was not seen previously, suggesting a small effusion. Increased interstitial markings most prominent throughout the  left lung and in the right upper lung are stable. No new infiltrates are noted. IMPRESSION: Possible tiny right pleural effusion not seen previously. Interstitial prominence bilaterally is stable. No acute abnormalities are seen within the chest. Electronically Signed   By: Gerome Sam III M.D   On: 04/16/2015 13:04    Scheduled Meds: . diltiazem  30 mg Oral 3 times per day  . enoxaparin (LOVENOX) injection  40 mg Subcutaneous Q24H  . famotidine (PEPCID) IV  20 mg Intravenous Q12H  . feeding supplement (ENSURE ENLIVE)  237 mL Oral BID BM  . guaiFENesin  1,200 mg Oral BID  . ipratropium-albuterol  3 mL Nebulization Q4H  . lisinopril  20 mg Oral Daily  . mometasone-formoterol  2 puff Inhalation BID  . nicotine  21 mg Transdermal Daily  . predniSONE  30 mg Oral Q breakfast  . risperiDONE  0.25 mg Oral QHS  . senna-docusate  1 tablet Oral BID  . sodium chloride flush  3 mL Intravenous Q12H  . traZODone  25 mg Oral QHS   Continuous Infusions: . sodium chloride 10 mL/hr (04/16/15 0746)    Principal Problem:   Acute respiratory failure with hypoxia and hypercarbia (HCC) Active Problems:   CAP (community acquired pneumonia)   Hypercapnic respiratory failure (HCC)   COPD exacerbation (HCC)   Tobacco abuse   Essential hypertension   Encephalopathy, metabolic   Protein-calorie malnutrition, severe   Time spent: 30 minutes  Elliot Cousin, M.D. [facility] Pager [phone] If 7PM-7AM, please contact night-coverage at [email or web address], password [facility] 04/17/2015, 9:07 AM  LOS: 12 days

## 2015-04-17 NOTE — Progress Notes (Signed)
Walked about 150 feet this morning, once at 0830 and again at 1300 for total of 330 feet. At 1300 became more tired and had some difficulty breathing but tolerated well.

## 2015-04-18 ENCOUNTER — Inpatient Hospital Stay
Admission: RE | Admit: 2015-04-18 | Discharge: 2015-04-21 | Disposition: A | Discharge status: Home / Self Care | Payer: No Typology Code available for payment source | Source: Ambulatory Visit | Attending: Internal Medicine | Admitting: Internal Medicine

## 2015-04-18 ENCOUNTER — Encounter (HOSPITAL_COMMUNITY): Payer: Self-pay | Admitting: Internal Medicine

## 2015-04-18 DIAGNOSIS — F0391 Unspecified dementia with behavioral disturbance: Secondary | ICD-10-CM

## 2015-04-18 DIAGNOSIS — F03918 Unspecified dementia, unspecified severity, with other behavioral disturbance: Secondary | ICD-10-CM

## 2015-04-18 DIAGNOSIS — J9692 Respiratory failure, unspecified with hypercapnia: Secondary | ICD-10-CM

## 2015-04-18 HISTORY — DX: Unspecified dementia with behavioral disturbance: F03.91

## 2015-04-18 HISTORY — DX: Unspecified dementia, unspecified severity, with other behavioral disturbance: F03.918

## 2015-04-18 MED ORDER — NICOTINE 21 MG/24HR TD PT24: 21.0000 mg | MEDICATED_PATCH | Freq: Every day | TRANSDERMAL | Status: AC

## 2015-04-18 MED ORDER — ENSURE ENLIVE PO LIQD: 237.0000 mL | Freq: Two times a day (BID) | ORAL | Status: AC

## 2015-04-18 MED ORDER — IPRATROPIUM-ALBUTEROL 0.5-2.5 (3) MG/3ML IN SOLN: 3.0000 mL | Freq: Two times a day (BID) | RESPIRATORY_TRACT | Status: AC

## 2015-04-18 MED ORDER — TRAZODONE HCL 100 MG PO TABS: 50.0000 mg | ORAL_TABLET | Freq: Every day | ORAL | Status: AC

## 2015-04-18 MED ORDER — PREDNISONE 10 MG PO TABS: ORAL_TABLET | ORAL | Status: AC

## 2015-04-18 MED ORDER — SENNOSIDES-DOCUSATE SODIUM 8.6-50 MG PO TABS: 1.0000 | ORAL_TABLET | Freq: Two times a day (BID) | ORAL | Status: AC

## 2015-04-18 MED ORDER — RISPERIDONE 0.25 MG PO TABS: 0.2500 mg | ORAL_TABLET | Freq: Every day | ORAL | Status: AC

## 2015-04-18 MED ORDER — IPRATROPIUM-ALBUTEROL 0.5-2.5 (3) MG/3ML IN SOLN: 3.0000 mL | Freq: Three times a day (TID) | RESPIRATORY_TRACT | Status: DC

## 2015-04-18 NOTE — Care Management Note (Signed)
Case Management Note  Patient Details  Name: Liana GeroldMack A Dura MRN: [id number] Date of Birth: [birth year]   Expected Discharge Date:  04/08/15               Expected Discharge Plan:  Skilled Nursing Facility  In-House Referral:  Clinical Social Work  Discharge planning Services  CM Consult  Post Acute Care Choice:  NA Choice offered to:  NA  DME Arranged:    DME Agency:     HH Arranged:    HH Agency:     Status of Service:  Completed, signed off  Medicare Important Message Given:  Yes Date Medicare IM Given:    Medicare IM give by:    Date Additional Medicare IM Given:    Additional Medicare Important Message give by:     If discussed at Long Length of Stay Meetings, dates discussed:    Additional Comments: Pt discharging ot SNF today. CSW is aware and working to find placement. No CM needs.  Malcolm Metrohildress, Skyeler Smola Demske, RN 04/18/2015, 11:31 AM

## 2015-04-18 NOTE — Clinical Social Work Note (Signed)
[facility] and would be returning to facility at discharge.  CSW notified patient's spouse, Debarah CrapeClaudia that patient was discharging and [facility].  CSW sent clinicals via [employer].  CSW signing off.   Avyana Puffenbarger, Juleen ChinaHeather D, LCSW

## 2015-04-18 NOTE — Progress Notes (Signed)
Pt's IV catheter removed and intact. Pt's IV site clean dry and intact. [facility]. All questions were answered and no further questions at this time. Pt in stable condition and in no acute distress at this time. Pt escorted by nurse tech.

## 2015-04-18 NOTE — Care Management Important Message (Signed)
Important Message  Patient Details  Name: Shane Todd MRN: [id number] Date of Birth: March 01, 1945   Medicare Important Message Given:  Yes    Malcolm MetroChildress, Arbie Reisz Demske, RN 04/18/2015, 11:31 AM

## 2015-04-18 NOTE — Discharge Summary (Signed)
Physician Discharge Summary  Shane Todd:[id number] DOB: 21-Dec-1945 DOA: 04/05/2015  PCP: [name], MD  Admit date: 04/05/2015 Discharge date: 04/18/2015  Time spent: Greater than 30 minutes  Recommendations for Outpatient Follow-up:  1. Patient needs BiPAP daily at bedtime for chronic hypercapnic respiratory failure. Settings 10/5 with 2 L of nasal cannula oxygen.  2. Recommend physical therapy at the skilled nursing facility. 3. Patient was discharged to the [facility] for rehabilitation.   Discharge Diagnoses:  1. Acute on chronic respiratory failure with hypoxia secondary to CAP and COPD exacerbation. 2. Acute and now chronic hypercapnic respiratory failure now requiring daily at bedtime BiPAP (no known history of hypercapnia prior to this hospitalization). -BiPAP settings 10/5 with 2 L of nasal oxygen. 3. Community acquired pneumonia. 4. Oxygen dependent COPD with exacerbation. On chronic home O2 at 2 L/m. 5. Acute metabolic encephalopathy secondary to illness and dementia with sundowning. 6. Delirium and sundowning secondary to dementia. 7. Essential hypertension. 8. Tobacco abuse. The patient was advised to stop smoking. 9. Severe protein calorie malnutrition. 10. Constipation. 11. Physical deconditioning.  Discharge Condition: Improved  Diet recommendation: Heart healthy  Filed Weights   04/13/15 0500 04/14/15 0500 04/17/15 0500  Weight: 62.3 kg (137 lb 5.6 oz) 61.7 kg (136 lb 0.4 oz) 60.9 kg (134 lb 4.2 oz)    History of present illness:  The patient is a 70 year old man with a history of oxygen dependent COPD, who presented to the ED on 04/05/2015 with a chief complaint of shortness of breath.   Hospital Course:  Acute on chronic respiratory failure with hypoxia and hypercarbia, secondary to CAP and COPD exacerbation. Patient was admitted on 04/05/15 for shortness of breath. Due to respiratory acidosis with a pH of 7.2, hypercarbia and hypoxia  with an initial PCO2 of 83, he was started on BiPAP and oxygen and DuoNeb nebulizers. His initial chest x-ray revealed left lower lobe pneumonia. He was also noted to have bronchospasms and was diagnosed with COPD exacerbation. He was started on IV Levaquin and IV Solu-Medrol. Pulmonologist, Dr. Juanetta Gosling was consulted-he made recommendations and followed the patient during the hospital course. He advised BiPAP for the patient at home as the patient would likely decompensate without it. -Patient's most recent ABG revealed improvement in his PCO2 which is likely at baseline ranging from 60-65. His oxygen saturations improved on nasal cannula oxygen.  -Chest x-ray on 04/11/15 revealed stable COPD with parenchymal scarring, but no CHF or pneumonia. -He has completed a course of IV Levaquin. IV Solu-Medrol was tapered off. Short course of prednisone was started and will Be completed in 2 days following discharge. -Home BiPAP was recommended by Dr. Juanetta Gosling. The patient was sometimes noncompliant in the hospital, but will need to be gently encouraged to wear it. He should continue on 2 L of nasal cannula oxygen chronically. -His respiratory status was stable and improved at the time of discharge.  Acute encephalopathy/delirium, etiology multifactorial. Patient was encephalopathic on admission, which was thought to be secondary to CO2 narcosis and acute illness. However, during the course of the hospitalization, he became more intermittently confused, even with improvement in his PCO2. Per his wife, she reported mental status changes at home for the past 6 months , consistent with dementia. -Investigational will studies were ordered. His UA was within normal limits; ammonia level, TSH, and vitamin B12 were within normal limits. His RPR was nonreactive. HIV was nonreactive. -CT head was canceled due to his agitation. He became more alert  and oriented, but it was believed that he did not have an acute  CVA. -Patient had become lethargic following Ativan. Ativan was discontinued. Haldol for agitation and delirium ordered and given when necessary. -Etiology of his encephalopathy is likely multifactorial including  dementia with sundowning, possible CO2 narcosis, ICU delirium, and/or steroid-induced delirium. -Bedtime Risperdal and trazodone were started on 04/15/15. The extent of his sundowning did decrease some. Patient is also easily directed without medications. We'll continue Risperdal and trazodone following discharge.  Hypertension with tachycardia. Patient was treated with Norvasc and lisinopril chronically. Both were restarted. His blood pressure had been trending up, which may have been secondary to IV steroid and agitation. -IV hydralazine ordered as needed. -Amlodipine was discontinued and diltiazem was started which has helped with his heart rate and blood pressure.  Tobacco abuse. Patient had been requesting to go outside to smoke per nursing staff. Nicotine patch was placed. He was counseled on tobacco cessation.  Hyperkalemia. Patient serum potassium increased to 5.4. He was given Kayexalate with improvement. -Would continue to monitor his serum potassium on lisinopril. Consider decreasing the dose of lisinopril if serum potassium is consistently elevated. -His serum potassium has been within normal limits for the past several days.  Nausea and vomiting, possibly secondary to constipation. Nursing reports that patient was given a hamburger and french fries a couple of nights ago by his family. He had 2 episodes of vomiting afterwards. Patient has not had a bowel movement in several days. -His diet was downgraded. He was started on IV Pepcid. Lipase and LFTs were within normal limits. KUB revealed fecal loading. Patient was given a suppository and started on Senokot S. He has had 2 bowel movements. -No further nausea or  vomiting.    Procedures:  BiPAP  Consultations:  Pulmonology    Discharge Exam: Filed Vitals:   04/18/15 0654 04/18/15 1043  BP: 133/49 104/40  Pulse: 82   Temp: 98.4 F (36.9 C)   Resp: 20    oxygen saturation 99% on 2 L nasal oxygen.  General: 70 year old Caucasian man who is sitting up in the chair. He looks much better. Cardiovascular: S1, S2, soft systolic murmur. Respiratory: Clear anteriorly with decreased breath sounds in the bases. Abdomen: Positive bowel sounds, soft, nontender, nondistended. Extremities: No pedal edema. Neurologic: He is alert and oriented to himself, his friend, hospital, and wife.  Discharge Instructions   Discharge Instructions    Diet - low sodium heart healthy    Complete by:  As directed      Increase activity slowly    Complete by:  As directed           Current Discharge Medication List    START taking these medications   Details  feeding supplement, ENSURE ENLIVE, (ENSURE ENLIVE) LIQD Take 237 mLs by mouth 2 (two) times daily between meals.    ipratropium-albuterol (DUONEB) 0.5-2.5 (3) MG/3ML SOLN Take 3 mLs by nebulization 2 (two) times daily.    nicotine (NICODERM CQ - DOSED IN MG/24 HOURS) 21 mg/24hr patch Place 1 patch (21 mg total) onto the skin daily.    predniSONE (DELTASONE) 10 MG tablet Take with breakfast daily for 2 more days.    risperiDONE (RISPERDAL) 0.25 MG tablet Take 1 tablet (0.25 mg total) by mouth at bedtime. Qty: 30 tablet, Refills: 1    senna-docusate (SENOKOT-S) 8.6-50 MG tablet Take 1 tablet by mouth 2 (two) times daily.      CONTINUE these medications which have CHANGED  Details  traZODone (DESYREL) 100 MG tablet Take 0.5-1 tablets (50-100 mg total) by mouth at bedtime. Qty: 30 tablet, Refills: 1      CONTINUE these medications which have NOT CHANGED   Details  albuterol (PROVENTIL HFA;VENTOLIN HFA) 108 (90 BASE) MCG/ACT inhaler Inhale 2 puffs into the lungs every 4 (four) hours as  needed for wheezing or shortness of breath. For shortness of breath    fluticasone furoate-vilanterol (BREO ELLIPTA) 100-25 MCG/INH AEPB Inhale 1 puff into the lungs daily.    lisinopril (PRINIVIL,ZESTRIL) 20 MG tablet Take 20 mg by mouth every morning.       STOP taking these medications     amLODipine (NORVASC) 5 MG tablet      mometasone-formoterol (DULERA) 100-5 MCG/ACT AERO      umeclidinium-vilanterol (ANORO ELLIPTA) 62.5-25 MCG/INH AEPB        Allergies  Allergen Reactions  . Oxycodone Nausea And Vomiting      The results of significant diagnostics from this hospitalization (including imaging, microbiology, ancillary and laboratory) are listed below for reference.    Significant Diagnostic Studies: Dg Abd 1 View  04/16/2015  CLINICAL DATA:  Vomiting.  Nausea. EXAM: ABDOMEN - 1 VIEW COMPARISON:  None. FINDINGS: Mild atelectasis in left lung base. No free air, portal venous gas, or pneumatosis. Fecal loading is seen in the distal transverse colon and proximal descending colon. The bowel gas pattern is otherwise normal. There is no evidence of obstruction. A calcification in the right upper quadrant may be a renal stone. No other significant abnormalities on today's study. IMPRESSION: Possible right renal stone. Fecal loading in the colon. No other acute abnormalities. Electronically Signed   By: Gerome Samavid  Williams III M.D   On: 04/16/2015 13:06   Dg Chest Port 1 View  04/16/2015  CLINICAL DATA:  Chest congestion. EXAM: PORTABLE CHEST 1 VIEW COMPARISON:  April 11, 2015 FINDINGS: No pneumothorax. The heart, hila, and mediastinum are unchanged. Minimal blunting of the right costophrenic angle was not seen previously, suggesting a small effusion. Increased interstitial markings most prominent throughout the left lung and in the right upper lung are stable. No new infiltrates are noted. IMPRESSION: Possible tiny right pleural effusion not seen previously. Interstitial prominence  bilaterally is stable. No acute abnormalities are seen within the chest. Electronically Signed   By: Gerome Samavid  Williams III M.D   On: 04/16/2015 13:04   Dg Chest Port 1 View  04/11/2015  CLINICAL DATA:  Respiratory failure, community-acquired pneumonia, Celsius OPD, current smoker. EXAM: PORTABLE CHEST 1 VIEW COMPARISON:  Portable chest x-ray of April 08, 2015 FINDINGS: The lungs are hyperinflated. The interstitial markings are coarse bilaterally. There are no discrete air bronchograms. There is no pleural effusion or pneumothorax. The heart and pulmonary vascularity are normal. The mediastinum is normal in width. The bony thorax exhibits no acute abnormality. 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 There is no alveolar pneumonia nor CHF. Electronically Signed   By: David  SwazilandJordan M.D.   On: 04/11/2015 08:57   Dg Chest Port 1 View  04/08/2015  CLINICAL DATA:  Fever.  COPD. EXAM: PORTABLE CHEST 1 VIEW COMPARISON:  04/05/2015 FINDINGS: Pulmonary hyperinflation and diffuse interstitial prominence appears stable and consistent with COPD. No evidence of pulmonary consolidation or pleural effusion. Heart size and mediastinal contours are within normal limits. IMPRESSION: Stable exam.  COPD.  No active disease. Electronically Signed   By: Myles RosenthalJohn  Stahl M.D.   On: 04/08/2015 14:11   Dg Chest Portable 1  View  04/05/2015  CLINICAL DATA:  Short of breath for 1 day EXAM: PORTABLE CHEST 1 VIEW COMPARISON:  07/02/2013 FINDINGS: Diffuse vascular congestion. Upper normal heart size. Kerley B-lines at the lung bases. Hyperaeration. No pneumothorax. More confluent opacity at the left base may represent airspace edema. IMPRESSION: Findings above for worrisome for mild interstitial edema and vascular congestion. Airspace disease at the left base is nonspecific and may represent focal edema or an inflammatory process. Electronically Signed   By: Jolaine Click M.D.   On: 04/05/2015 17:42     Microbiology: No results found for this or any previous visit (from the past 240 hour(s)).   Labs: Basic Metabolic Panel:  Recent Labs Lab 04/13/15 0444 04/14/15 0456 04/15/15 0934 04/16/15 0424 04/17/15 0459  NA 137 135 134* 135 134*  K 4.7 4.1 4.1 4.1 3.7  CL 94* 95* 95* 95* 94*  CO2 36* 33* 33* 37* 35*  GLUCOSE 97 98 159* 73 70  BUN 26* 28* 21* 22* 21*  CREATININE 0.47* 0.47* 0.68 0.57* 0.57*  CALCIUM 8.3* 8.0* 7.9* 8.1* 7.9*   Liver Function Tests:  Recent Labs Lab 04/17/15 0459  AST 29  ALT 28  ALKPHOS 44  BILITOT 0.9  PROT 4.4*  ALBUMIN 2.5*    Recent Labs Lab 04/16/15 0424  LIPASE 28    Recent Labs Lab 04/12/15 1016  AMMONIA 23   CBC:  Recent Labs Lab 04/13/15 0444 04/17/15 0459  WBC 8.5 8.5  HGB 14.8 11.7*  HCT 46.4 36.6*  MCV 94.9 96.3  PLT 201 119*   Cardiac Enzymes: No results for input(s): CKTOTAL, CKMB, CKMBINDEX, TROPONINI in the last 168 hours. BNP: BNP (last 3 results)  Recent Labs  04/05/15 1721  BNP 170.0*    ProBNP (last 3 results) No results for input(s): PROBNP in the last 8760 hours.  CBG:  Recent Labs Lab 04/13/15 1552 04/15/15 2019  GLUCAP 75 88       Signed:  Aesha Agrawal MD.  [facility] 04/18/2015, 10:54 AM

## 2015-04-18 NOTE — Progress Notes (Signed)
There is apparently potential for him to be transferred to skilled care facility today. Although he has been confused in the evenings he does say that he would agree to wear BiPAP and I think that's going to be important for us to at least attempt this at the skilled care facility. I think if he doesn't have the BiPAP it's very likely that he will again developed increasing PCO2 and end up back in the hospital.

## 2015-04-19 ENCOUNTER — Non-Acute Institutional Stay (SKILLED_NURSING_FACILITY): Payer: Medicare Other | Admitting: Internal Medicine

## 2015-04-19 ENCOUNTER — Encounter (HOSPITAL_COMMUNITY)
Admission: RE | Admit: 2015-04-19 | Discharge: 2015-04-19 | Disposition: A | Discharge status: Home / Self Care | Payer: Medicare Other | Source: Skilled Nursing Facility | Attending: Internal Medicine | Admitting: Internal Medicine

## 2015-04-19 DIAGNOSIS — J441 Chronic obstructive pulmonary disease with (acute) exacerbation: Secondary | ICD-10-CM | POA: Diagnosis not present

## 2015-04-19 DIAGNOSIS — G9340 Encephalopathy, unspecified: Secondary | ICD-10-CM

## 2015-04-19 DIAGNOSIS — J189 Pneumonia, unspecified organism: Secondary | ICD-10-CM | POA: Diagnosis not present

## 2015-04-19 DIAGNOSIS — R41841 Cognitive communication deficit: Secondary | ICD-10-CM | POA: Insufficient documentation

## 2015-04-19 DIAGNOSIS — I10 Essential (primary) hypertension: Secondary | ICD-10-CM

## 2015-04-19 LAB — CBC
HEMATOCRIT: 38.7 % — AB (ref 39.0–52.0)
Hemoglobin: 12.4 g/dL — ABNORMAL LOW (ref 13.0–17.0)
MCH: 30.3 pg (ref 26.0–34.0)
MCHC: 32 g/dL (ref 30.0–36.0)
MCV: 94.6 fL (ref 78.0–100.0)
PLATELETS: 198 10*3/uL (ref 150–400)
RBC: 4.09 MIL/uL — AB (ref 4.22–5.81)
RDW: 13.1 % (ref 11.5–15.5)
WBC: 11.8 10*3/uL — AB (ref 4.0–10.5)

## 2015-04-19 LAB — BASIC METABOLIC PANEL
ANION GAP: 5 (ref 5–15)
BUN: 21 mg/dL — ABNORMAL HIGH (ref 6–20)
CO2: 40 mmol/L — AB (ref 22–32)
Calcium: 8.4 mg/dL — ABNORMAL LOW (ref 8.9–10.3)
Chloride: 91 mmol/L — ABNORMAL LOW (ref 101–111)
Creatinine, Ser: 0.65 mg/dL (ref 0.61–1.24)
GFR calc Af Amer: >60 mL/min (ref >60)
GLUCOSE: 80 mg/dL (ref 65–99)
POTASSIUM: 3.9 mmol/L (ref 3.5–5.1)
Sodium: 136 mmol/L (ref 135–145)

## 2015-04-19 NOTE — Progress Notes (Signed)
Patient ID: Shane Todd, male   DOB: 04/29/1945, 70 y.o.   MRN: [id number]    This is an acute visit.  Level care skilled.  Facility [facility].  Chief complaint-acute visit status post hospitalization for acute on chronic respiratory failure secondary to pneumonia and COPD exasperation.  History of present illness.  Patient is a pleasant 70 year old male who presented to the ER with shortness of breath-with a history of oxygen dependent COPD.  He was found to have respiratory acidosis with a pH of 7.2 Her Cartia and hypoxia.  He was started on BiPAP oxygen and dual nebs.  Chest x-ray showed left lower lobe pneumonia.  He was diagnosed with COPD exasperation started on IV Levaquin and Solu-Medrol.  -He is been transitioned to a prednisone taper.  Home BiPAP was recommended by pulmonology.  Patient was also encephalopathic on admission thought secondary to CO2 narcosis an acute illness.  Despite improvement with this CO2 patient continued be confused at times.  UA was within normal limits as well as ammonia level TSH and B12.  RPR and HIV was nonreactive.  CT of the head was canceled because of agitation however he did become more alert and oriented that was thought possibility CVA was fairly remote.  Patient did become lethargic with Ativan it was discontinued he had Haldol for agitation and delirium and this was given when necessary.  Cause of encephalopathy was thought to be multifactorial including dementia with sundowning-possible CO2 narcosis-ICU delirium and/or steroid-induced delirium.  At bedtime Risperdal and trazodone were started on March 3-.  Per exam today his and soft without the appears to be essentially resolved he is pleasant and appropriate.  Patient also had issues with hypertension with tachycardia this was treated with Norvasc and lisinopril.  IV hydralazine was ordered as needed.  Apparently during hospitalization his Norvasc was  discontinued and diltiazem started to help with his tachycardia.  Patient did want to smoke in the hospital and was started on a nicotine patch area  He also had mild hyperkalemia with potassium 5.4 he was given Kayexalate and this improved.  Previous medical history.  Acute on chronic respiratory failure with hypoxia.  Community-acquired pneumonia.  COPD with exasperation.  Acute metabolic encephalopathy secondary to illness and dementia.  Hypertension.  Severe protein calorie malnutrition.  Tobacco abuse.  Constipation.  Physical deconditioning.  Surgical history-.  Did have an inguinal hernia repair 06/06/2012.  Also previous history of hemorrhoid surgery.  Social history-patient has a significant history of tobacco use up to his recent hospitalization.   no history of smokeless tobacco and illicit drug use or significant alcohol use.  Family history significant for CVA in mother-DVT Faller.  Sister. with a history of lung cancer-throat-hypertension.  Brother with a history of heart disease and CVA.  Also has a sister with a history of heart disease and CVA   Medications.  DuoNeb nebulizers twice a day.  NicoDerm patch 21 mg every day patch.  Prednisone 10 mg a day 2 days.    0.25 mg daily at bedtime.  Senokot-S twice a day 1 tablet.  Trazodone 50 mg daily at bedtime.    Albuterol inhaler 2 puffs every 4 hours when necessary.  3Breo--ellipta 100-25 one puff daily.  Lisinopril 20 mg every morning.  Review of systems.  General does not complain of any fever or chills.  Skin does not complain rashes or itching.  Head ears eyes nose mouth and throat-does not complaining of any sore throat or nasal  discharge or visual changes.  Respiratory says his breathing gradually is getting better still has some cough.  Cardiac does not complain of chest pain.  GI does not complain of nausea vomiting diarrhea constipation or abdominal pain.  GU is  not complaining of dysuria.  Muscle skeletal has weakness but does not complain of any acute joint pain.  Neurologic-does not complain of dizziness or headache.  Psych-is not complaining currently of anxiety or depression-- history of confusion but this appears to be improving  Physical exam.  Temperature 98.2 pulse 76 respirations 20 blood pressure 110/70.  In general this is a pleasant elderly male in no distress-he is frail-appearing.  His skin is warm and dry.  Eyes pupils appear to be reactive light visual acuity appears grossly intact sclera and conjunctiva clear.  Oropharynx is clear mucous membranes moist.  Chest he does have some mild wheezing upper lung fields-decreased air entry most noticeable at the bases.  No labored breathing.  Heart is regular rate and rhythm with a 2/6 systolic murmur she does not have significant lower extremity edema.  Abdomen is soft nontender with positive bowel sounds.  GU does not have any suprapubic tenderness to palpation.  Musculoskeletal-general frailty but is able to move all extremities 4 no deformities noted other than arthritic.  Ambulating in a wheelchair currently.  Psych he appears grossly alert and oriented he is pleasant and appropriate.  Labs.  04/19/2015.  WBC 11.8 hemoglobin 12.4 platelets 198.  Sodium 136 potassium 3.9 BUN 21 creatinine 0.65.  Assessment and plan.  #1 history of acute respiratory failure with hypoxia secondary to pneumonia and COPD exasperation-this appears to have improved-says his breathing gradually is getting better he still has significant weakness and would benefit from rehabilitation-he continues on duo nebs twice a day as well as albuterol inhaler when necessary--will increase duo nebs to 3 times a day-he is completing a short prednisone taper as well-also continues on Breo-ellipta  #2-hypertension the medication adjustments were made in the hospital he is now on low-dose lisinopril  apparently in hospital he was also given.diltiazem for tachycardia-at this point heart rate appears to be controlled but this will have to be monitored blood pressure appears stable at 110/70  #3-encephalopathy-this appears to be significantly improved again thought was be multifactorial apparently there is some element of dementia with sundowning and this will have to be monitored.--He continues on Risperdal at night  #4 history of tobacco abuse he is on a nicotine patch.  #5 history of insomnia he is on trazodone still complain somewhat of insomnia at times with this will have to be monitored since trazodone was just recently started  #6 leukocytosis -- suspect this could be due to the prednisone-will recheck this on Thursday, March 9-he denies any fever or chills  Or dysuria-says his breathing is better  [id number]-of note greater than 40 minutes spent assessing patient-reviewing his chart-and coordinating and formulating a plan of care for numerous diagnoses-of note greater than 50% of time spent coordinating plan of care

## 2015-04-21 ENCOUNTER — Encounter (HOSPITAL_COMMUNITY)
Admission: AD | Admit: 2015-04-21 | Discharge: 2015-04-21 | Disposition: A | Discharge status: Home / Self Care | Payer: Medicare Other | Source: Skilled Nursing Facility | Attending: Internal Medicine | Admitting: Internal Medicine

## 2015-04-24 ENCOUNTER — Encounter: Payer: Self-pay | Admitting: Internal Medicine

## 2015-06-22 ENCOUNTER — Inpatient Hospital Stay (HOSPITAL_COMMUNITY): Payer: Medicare Other

## 2015-06-22 ENCOUNTER — Emergency Department (HOSPITAL_COMMUNITY): Payer: Medicare Other

## 2015-06-22 ENCOUNTER — Encounter (HOSPITAL_COMMUNITY): Payer: Self-pay

## 2015-06-22 ENCOUNTER — Inpatient Hospital Stay (HOSPITAL_COMMUNITY)
Admission: EM | Admit: 2015-06-22 | Discharge: 2015-07-14 | DRG: 207 | Disposition: E | Payer: Medicare Other | Attending: Internal Medicine | Admitting: Internal Medicine

## 2015-06-22 DIAGNOSIS — F0151 Vascular dementia with behavioral disturbance: Secondary | ICD-10-CM | POA: Diagnosis present

## 2015-06-22 DIAGNOSIS — J9602 Acute respiratory failure with hypercapnia: Secondary | ICD-10-CM | POA: Diagnosis not present

## 2015-06-22 DIAGNOSIS — J9621 Acute and chronic respiratory failure with hypoxia: Secondary | ICD-10-CM | POA: Diagnosis present

## 2015-06-22 DIAGNOSIS — Z978 Presence of other specified devices: Secondary | ICD-10-CM

## 2015-06-22 DIAGNOSIS — K228 Other specified diseases of esophagus: Secondary | ICD-10-CM | POA: Diagnosis not present

## 2015-06-22 DIAGNOSIS — L309 Dermatitis, unspecified: Secondary | ICD-10-CM | POA: Diagnosis present

## 2015-06-22 DIAGNOSIS — Z4659 Encounter for fitting and adjustment of other gastrointestinal appliance and device: Secondary | ICD-10-CM

## 2015-06-22 DIAGNOSIS — Z7189 Other specified counseling: Secondary | ICD-10-CM | POA: Insufficient documentation

## 2015-06-22 DIAGNOSIS — D649 Anemia, unspecified: Secondary | ICD-10-CM | POA: Diagnosis present

## 2015-06-22 DIAGNOSIS — Z515 Encounter for palliative care: Secondary | ICD-10-CM | POA: Diagnosis not present

## 2015-06-22 DIAGNOSIS — K9189 Other postprocedural complications and disorders of digestive system: Secondary | ICD-10-CM | POA: Diagnosis not present

## 2015-06-22 DIAGNOSIS — Z8249 Family history of ischemic heart disease and other diseases of the circulatory system: Secondary | ICD-10-CM

## 2015-06-22 DIAGNOSIS — K254 Chronic or unspecified gastric ulcer with hemorrhage: Secondary | ICD-10-CM | POA: Diagnosis present

## 2015-06-22 DIAGNOSIS — R0602 Shortness of breath: Secondary | ICD-10-CM | POA: Diagnosis present

## 2015-06-22 DIAGNOSIS — I10 Essential (primary) hypertension: Secondary | ICD-10-CM | POA: Diagnosis present

## 2015-06-22 DIAGNOSIS — E46 Unspecified protein-calorie malnutrition: Secondary | ICD-10-CM | POA: Diagnosis not present

## 2015-06-22 DIAGNOSIS — R140 Abdominal distension (gaseous): Secondary | ICD-10-CM | POA: Diagnosis not present

## 2015-06-22 DIAGNOSIS — J9620 Acute and chronic respiratory failure, unspecified whether with hypoxia or hypercapnia: Secondary | ICD-10-CM | POA: Diagnosis present

## 2015-06-22 DIAGNOSIS — G9341 Metabolic encephalopathy: Secondary | ICD-10-CM | POA: Diagnosis present

## 2015-06-22 DIAGNOSIS — Z823 Family history of stroke: Secondary | ICD-10-CM

## 2015-06-22 DIAGNOSIS — E43 Unspecified severe protein-calorie malnutrition: Secondary | ICD-10-CM | POA: Diagnosis present

## 2015-06-22 DIAGNOSIS — K219 Gastro-esophageal reflux disease without esophagitis: Secondary | ICD-10-CM | POA: Diagnosis present

## 2015-06-22 DIAGNOSIS — Z66 Do not resuscitate: Secondary | ICD-10-CM | POA: Diagnosis present

## 2015-06-22 DIAGNOSIS — Z9119 Patient's noncompliance with other medical treatment and regimen: Secondary | ICD-10-CM | POA: Diagnosis not present

## 2015-06-22 DIAGNOSIS — J9622 Acute and chronic respiratory failure with hypercapnia: Secondary | ICD-10-CM

## 2015-06-22 DIAGNOSIS — Z720 Tobacco use: Secondary | ICD-10-CM | POA: Diagnosis present

## 2015-06-22 DIAGNOSIS — J441 Chronic obstructive pulmonary disease with (acute) exacerbation: Principal | ICD-10-CM | POA: Diagnosis present

## 2015-06-22 DIAGNOSIS — F17210 Nicotine dependence, cigarettes, uncomplicated: Secondary | ICD-10-CM | POA: Diagnosis present

## 2015-06-22 DIAGNOSIS — Z87442 Personal history of urinary calculi: Secondary | ICD-10-CM | POA: Diagnosis not present

## 2015-06-22 DIAGNOSIS — Z681 Body mass index (BMI) 19 or less, adult: Secondary | ICD-10-CM

## 2015-06-22 DIAGNOSIS — R0902 Hypoxemia: Secondary | ICD-10-CM

## 2015-06-22 DIAGNOSIS — K920 Hematemesis: Secondary | ICD-10-CM | POA: Diagnosis not present

## 2015-06-22 DIAGNOSIS — R0600 Dyspnea, unspecified: Secondary | ICD-10-CM | POA: Diagnosis not present

## 2015-06-22 DIAGNOSIS — Z801 Family history of malignant neoplasm of trachea, bronchus and lung: Secondary | ICD-10-CM | POA: Diagnosis not present

## 2015-06-22 DIAGNOSIS — K226 Gastro-esophageal laceration-hemorrhage syndrome: Secondary | ICD-10-CM | POA: Diagnosis present

## 2015-06-22 DIAGNOSIS — Z7982 Long term (current) use of aspirin: Secondary | ICD-10-CM | POA: Diagnosis not present

## 2015-06-22 DIAGNOSIS — F419 Anxiety disorder, unspecified: Secondary | ICD-10-CM | POA: Diagnosis present

## 2015-06-22 DIAGNOSIS — J9690 Respiratory failure, unspecified, unspecified whether with hypoxia or hypercapnia: Secondary | ICD-10-CM

## 2015-06-22 LAB — BLOOD GAS, ARTERIAL
ACID-BASE EXCESS: 6.1 mmol/L — AB (ref 0.0–2.0)
Acid-Base Excess: 6.7 mmol/L — ABNORMAL HIGH (ref 0.0–2.0)
Acid-Base Excess: 5.5 mmol/L — ABNORMAL HIGH (ref 0.0–2.0)
Acid-Base Excess: 6.8 mmol/L — ABNORMAL HIGH (ref 0.0–2.0)
BICARBONATE: 27.9 meq/L — AB (ref 20.0–24.0)
BICARBONATE: 30.8 meq/L — AB (ref 20.0–24.0)
Bicarbonate: 27.8 mEq/L — ABNORMAL HIGH (ref 20.0–24.0)
Bicarbonate: 29.8 mEq/L — ABNORMAL HIGH (ref 20.0–24.0)
DRAWN BY: 234301
DRAWN BY: 234301
Drawn by: 234301
FIO2: 40
FIO2: 100
FIO2: 40
LHR: 16 {breaths}/min
Mode: POSITIVE
O2 CONTENT: 5 L/min
O2 SAT: 98.7 %
O2 Saturation: 98.3 %
O2 Saturation: 100 %
O2 Saturation: 99.4 %
PCO2 ART: 86.1 mmHg — AB (ref 35.0–45.0)
PCO2 ART: 35.3 mmHg (ref 35.0–45.0)
PEEP/CPAP: 8 cmH2O
PEEP/CPAP: 5 cmH2O
PEEP: 5 cmH2O
PH ART: 7.215 — AB (ref 7.350–7.450)
PO2 ART: 160 mmHg — AB (ref 80.0–100.0)
PO2 ART: 136 mmHg — AB (ref 80.0–100.0)
PO2 ART: 368 mmHg — AB (ref 80.0–100.0)
PRESSURE SUPPORT: 20 cmH2O
RATE: 16 resp/min
VT: 600 mL
VT: 600 mL
pCO2 arterial: 94.5 mmHg (ref 35.0–45.0)
pCO2 arterial: 38.5 mmHg (ref 35.0–45.0)
pH, Arterial: 7.189 — CL (ref 7.350–7.450)
pH, Arterial: 7.519 — ABNORMAL HIGH (ref 7.350–7.450)
pH, Arterial: 7.506 — ABNORMAL HIGH (ref 7.350–7.450)
pO2, Arterial: 158 mmHg — ABNORMAL HIGH (ref 80.0–100.0)

## 2015-06-22 LAB — URINE MICROSCOPIC-ADD ON
RBC / HPF: NONE SEEN RBC/hpf (ref 0–5)
Squamous Epithelial / LPF: NONE SEEN
WBC, UA: NONE SEEN WBC/hpf (ref 0–5)

## 2015-06-22 LAB — CBC WITH DIFFERENTIAL/PLATELET
BASOS ABS: 0 10*3/uL (ref 0.0–0.1)
Basophils Relative: 0 %
EOS ABS: 0 10*3/uL (ref 0.0–0.7)
EOS PCT: 0 %
HCT: 39.9 % (ref 39.0–52.0)
Hemoglobin: 12.4 g/dL — ABNORMAL LOW (ref 13.0–17.0)
Lymphocytes Relative: 13 %
Lymphs Abs: 1.1 10*3/uL (ref 0.7–4.0)
MCH: 30.4 pg (ref 26.0–34.0)
MCHC: 31.1 g/dL (ref 30.0–36.0)
MCV: 97.8 fL (ref 78.0–100.0)
Monocytes Absolute: 0.7 10*3/uL (ref 0.1–1.0)
Monocytes Relative: 8 %
Neutro Abs: 6.4 10*3/uL (ref 1.7–7.7)
Neutrophils Relative %: 79 %
PLATELETS: 220 10*3/uL (ref 150–400)
RBC: 4.08 MIL/uL — AB (ref 4.22–5.81)
RDW: 14 % (ref 11.5–15.5)
WBC: 8.2 10*3/uL (ref 4.0–10.5)

## 2015-06-22 LAB — MRSA PCR SCREENING: MRSA BY PCR: NEGATIVE

## 2015-06-22 LAB — BASIC METABOLIC PANEL
ANION GAP: 12 (ref 5–15)
BUN: 48 mg/dL — ABNORMAL HIGH (ref 6–20)
CALCIUM: 9.4 mg/dL (ref 8.9–10.3)
CHLORIDE: 96 mmol/L — AB (ref 101–111)
CO2: 34 mmol/L — ABNORMAL HIGH (ref 22–32)
CREATININE: 0.84 mg/dL (ref 0.61–1.24)
GFR calc non Af Amer: >60 mL/min (ref >60)
Glucose, Bld: 149 mg/dL — ABNORMAL HIGH (ref 65–99)
Potassium: 4.2 mmol/L (ref 3.5–5.1)
Sodium: 142 mmol/L (ref 135–145)

## 2015-06-22 LAB — URINALYSIS, ROUTINE W REFLEX MICROSCOPIC
BILIRUBIN URINE: NEGATIVE
GLUCOSE, UA: NEGATIVE mg/dL
HGB URINE DIPSTICK: NEGATIVE
KETONES UR: NEGATIVE mg/dL
Leukocytes, UA: NEGATIVE
Nitrite: NEGATIVE
PROTEIN: 30 mg/dL — AB
Specific Gravity, Urine: >1.03 — ABNORMAL HIGH (ref 1.005–1.030)
pH: 6 (ref 5.0–8.0)

## 2015-06-22 LAB — TROPONIN I

## 2015-06-22 MED ORDER — ALBUTEROL SULFATE (2.5 MG/3ML) 0.083% IN NEBU
2.5000 mg | INHALATION_SOLUTION | Freq: Four times a day (QID) | RESPIRATORY_TRACT | Status: DC
  Administered 2015-06-22 – 2015-06-24 (×11): 2.5 mg via RESPIRATORY_TRACT
  Filled 2015-06-22 (×10): qty 3

## 2015-06-22 MED ORDER — DEXTROSE-NACL 5-0.9 % IV SOLN
INTRAVENOUS | Status: DC
Start: 2015-06-22 — End: 2015-07-01
  Administered 2015-06-22 (×2): via INTRAVENOUS
  Administered 2015-06-23 (×2): 1000 mL via INTRAVENOUS
  Administered 2015-06-23 – 2015-06-25 (×5): via INTRAVENOUS
  Administered 2015-06-25: 1000 mL via INTRAVENOUS
  Administered 2015-06-26: 16:00:00 via INTRAVENOUS
  Administered 2015-06-26: 1000 mL via INTRAVENOUS
  Administered 2015-06-27 (×3): via INTRAVENOUS
  Administered 2015-06-28: 1000 mL via INTRAVENOUS

## 2015-06-22 MED ORDER — LEVOFLOXACIN IN D5W 750 MG/150ML IV SOLN
750.0000 mg | INTRAVENOUS | Status: DC
  Administered 2015-06-22 – 2015-06-28 (×7): 750 mg via INTRAVENOUS
  Filled 2015-06-22 (×7): qty 150

## 2015-06-22 MED ORDER — IPRATROPIUM-ALBUTEROL 0.5-2.5 (3) MG/3ML IN SOLN
3.0000 mL | Freq: Once | RESPIRATORY_TRACT | Status: AC
  Administered 2015-06-22: 3 mL via RESPIRATORY_TRACT
  Filled 2015-06-22: qty 3

## 2015-06-22 MED ORDER — ENOXAPARIN SODIUM 40 MG/0.4ML ~~LOC~~ SOLN
40.0000 mg | SUBCUTANEOUS | Status: DC
  Administered 2015-06-22 – 2015-06-23 (×2): 40 mg via SUBCUTANEOUS
  Filled 2015-06-22 (×2): qty 0.4

## 2015-06-22 MED ORDER — PROPOFOL 1000 MG/100ML IV EMUL
5.0000 ug/kg/min | Freq: Once | INTRAVENOUS | Status: AC
  Administered 2015-06-22: 20 ug/kg/min via INTRAVENOUS

## 2015-06-22 MED ORDER — ONDANSETRON HCL 4 MG/2ML IJ SOLN: 4.0000 mg | Freq: Four times a day (QID) | INTRAMUSCULAR | Status: DC | PRN

## 2015-06-22 MED ORDER — ALBUTEROL (5 MG/ML) CONTINUOUS INHALATION SOLN
5.0000 mg/h | INHALATION_SOLUTION | Freq: Once | RESPIRATORY_TRACT | Status: AC
  Administered 2015-06-22: 5 mg/h via RESPIRATORY_TRACT
  Filled 2015-06-22: qty 20

## 2015-06-22 MED ORDER — ANTISEPTIC ORAL RINSE SOLUTION (CORINZ)
7.0000 mL | Freq: Four times a day (QID) | OROMUCOSAL | Status: DC
  Administered 2015-06-22 – 2015-06-29 (×28): 7 mL via OROMUCOSAL

## 2015-06-22 MED ORDER — ROCURONIUM BROMIDE 50 MG/5ML IV SOLN
100.0000 mg | Freq: Once | INTRAVENOUS | Status: AC
  Administered 2015-06-22: 100 mg via INTRAVENOUS

## 2015-06-22 MED ORDER — SODIUM CHLORIDE 0.9 % IV BOLUS (SEPSIS)
1000.0000 mL | Freq: Once | INTRAVENOUS | Status: AC
  Administered 2015-06-22: 1000 mL via INTRAVENOUS

## 2015-06-22 MED ORDER — SODIUM CHLORIDE 0.9% FLUSH
3.0000 mL | Freq: Two times a day (BID) | INTRAVENOUS | Status: DC
  Administered 2015-06-22 – 2015-06-29 (×11): 3 mL via INTRAVENOUS

## 2015-06-22 MED ORDER — METHYLPREDNISOLONE SODIUM SUCC 40 MG IJ SOLR
80.0000 mg | Freq: Once | INTRAMUSCULAR | Status: AC
  Administered 2015-06-22: 80 mg via INTRAVENOUS
  Filled 2015-06-22: qty 2

## 2015-06-22 MED ORDER — ALBUTEROL SULFATE (2.5 MG/3ML) 0.083% IN NEBU
2.5000 mg | INHALATION_SOLUTION | RESPIRATORY_TRACT | Status: DC | PRN
  Administered 2015-06-24 – 2015-06-25 (×2): 2.5 mg via RESPIRATORY_TRACT
  Filled 2015-06-22 (×3): qty 3

## 2015-06-22 MED ORDER — ETOMIDATE 2 MG/ML IV SOLN
20.0000 mg | Freq: Once | INTRAVENOUS | Status: AC
  Administered 2015-06-22: 20 mg via INTRAVENOUS

## 2015-06-22 MED ORDER — SODIUM CHLORIDE 0.9 % IV SOLN
Freq: Once | INTRAVENOUS | Status: AC
  Administered 2015-06-22: 12:00:00 via INTRAVENOUS

## 2015-06-22 MED ORDER — CHLORHEXIDINE GLUCONATE 0.12% ORAL RINSE (MEDLINE KIT)
15.0000 mL | Freq: Two times a day (BID) | OROMUCOSAL | Status: DC
  Administered 2015-06-22 – 2015-06-30 (×14): 15 mL via OROMUCOSAL

## 2015-06-22 MED ORDER — ONDANSETRON HCL 4 MG PO TABS: 4.0000 mg | ORAL_TABLET | Freq: Four times a day (QID) | ORAL | Status: DC | PRN

## 2015-06-22 MED ORDER — PROPOFOL 1000 MG/100ML IV EMUL
5.0000 ug/kg/min | INTRAVENOUS | Status: DC
  Administered 2015-06-22 (×2): 50 ug/kg/min via INTRAVENOUS
  Administered 2015-06-23 (×3): 60 ug/kg/min via INTRAVENOUS
  Administered 2015-06-23: 70.079 ug/kg/min via INTRAVENOUS
  Administered 2015-06-23: 59.055 ug/kg/min via INTRAVENOUS
  Administered 2015-06-23: 70 ug/kg/min via INTRAVENOUS
  Administered 2015-06-24: 50 ug/kg/min via INTRAVENOUS
  Administered 2015-06-24: 40 ug/kg/min via INTRAVENOUS
  Administered 2015-06-24 (×2): 50 ug/kg/min via INTRAVENOUS
  Administered 2015-06-25: 25 ug/kg/min via INTRAVENOUS
  Administered 2015-06-25: 70 ug/kg/min via INTRAVENOUS
  Administered 2015-06-25 (×2): 60 ug/kg/min via INTRAVENOUS
  Administered 2015-06-25: 70 ug/kg/min via INTRAVENOUS
  Administered 2015-06-26: 55 ug/kg/min via INTRAVENOUS
  Administered 2015-06-26: 60 ug/kg/min via INTRAVENOUS
  Administered 2015-06-26: 45 ug/kg/min via INTRAVENOUS
  Administered 2015-06-26: 20 ug/kg/min via INTRAVENOUS
  Administered 2015-06-26: 60 ug/kg/min via INTRAVENOUS
  Administered 2015-06-27: 30 ug/kg/min via INTRAVENOUS
  Administered 2015-06-27 – 2015-06-28 (×5): 60 ug/kg/min via INTRAVENOUS
  Filled 2015-06-22 (×3): qty 100
  Filled 2015-06-22: qty 400
  Filled 2015-06-22 (×17): qty 100
  Filled 2015-06-22: qty 300
  Filled 2015-06-22 (×2): qty 100

## 2015-06-22 MED ORDER — FAMOTIDINE IN NACL 20-0.9 MG/50ML-% IV SOLN
20.0000 mg | Freq: Two times a day (BID) | INTRAVENOUS | Status: DC
  Administered 2015-06-22 – 2015-06-23 (×4): 20 mg via INTRAVENOUS
  Filled 2015-06-22 (×5): qty 50

## 2015-06-22 MED ORDER — METHYLPREDNISOLONE SODIUM SUCC 125 MG IJ SOLR
60.0000 mg | Freq: Four times a day (QID) | INTRAMUSCULAR | Status: DC
  Administered 2015-06-22 – 2015-06-29 (×28): 60 mg via INTRAVENOUS
  Filled 2015-06-22 (×28): qty 2

## 2015-06-22 NOTE — ED Notes (Signed)
Pt more alert, speaking in full sentences. Following commands. Bipap removed briefly, pt with productive green phlegm. Becoming more tachypneic. Resident and respiratory therapist made aware.

## 2015-06-22 NOTE — ED Notes (Signed)
MD at bedside; requesting to intubate patient. Moved to room 2.  2nd IV started.

## 2015-06-22 NOTE — Progress Notes (Signed)
Restraints not needed. Mitts used with medication.

## 2015-06-22 NOTE — ED Notes (Signed)
Pt reports cough x 1 week and worsening SOB x 2 days.  Denies fever or pain.

## 2015-06-22 NOTE — H&P (Signed)
[facility] History and Physical  Shane Todd WGN:[id number]:[id number] DOB: April 18, 1945    PCP:   Trinna PostKOBERLEIN, JUNELL CAROL, MD   Chief Complaint: SOB.   HPI: Shane Todd is an 70 y.o. male with hx of severe COPD, refusal to wear Bipap at home, continued smoker, hx of HTN, anxiety, vascular dementia, presented to the ER with one week hx of progressive SOB and green sputum productive coughs and having confusion.  He did not have CP, fever, or chills.  Evaluation in the ER with negative CXR and normal WBC and normal renal fx tests.  His ABG #1 showed 7.18/95/ POx=160 on 5 L [place].  He was placed on Bipap for several hours, and follow up ABG #2 showed 7.21/86/POx 136.  I was asked to admit him for acute on chronic respiratory failure due to COPD exacerbation.   Rewiew of Systems:  Constitutional: Negative for malaise, fever and chills. No significant weight loss or weight gain Eyes: Negative for eye pain, redness and discharge, diplopia, visual changes, or flashes of light. ENMT: Negative for ear pain, hoarseness, nasal congestion, sinus pressure and sore throat. No headaches; tinnitus, drooling, or problem swallowing. Cardiovascular: Negative for chest pain, palpitations, diaphoresis, and peripheral edema. ; No orthopnea, PND Respiratory: Negative for hemoptysis, and stridor. No pleuritic chestpain. Gastrointestinal: Negative for nausea, vomiting, diarrhea, constipation, abdominal pain, melena, blood in stool, hematemesis, jaundice and rectal bleeding.    Genitourinary: Negative for frequency, dysuria, incontinence,flank pain and hematuria; Musculoskeletal: Negative for back pain and neck pain. Negative for swelling and trauma.;  Skin: . Negative for pruritus, rash, abrasions, bruising and skin lesion.; ulcerations Neuro: Negative for headache, lightheadedness and neck stiffness. Negative for weakness, altered level of consciousness , altered mental status, extremity weakness, burning feet,  involuntary movement, seizure and syncope.  Psych: negative for anxiety, depression, insomnia, tearfulness, panic attacks, hallucinations, paranoia, suicidal or homicidal ideation    Past Medical History  Diagnosis Date  . COPD (chronic obstructive pulmonary disease) (HCC)   . Hypertension   . Tobacco abuse   . Anxiety   . Anemia 04/24/2011  . Pneumonia     hospitalized 04/2011  . Shortness of breath     with exertion  . GERD (gastroesophageal reflux disease)   . Constipation   . Eczema   . Kidney stones   . Dementia with behavioral disturbance 04/18/2015  . Acute respiratory failure with hypoxia and hypercarbia (HCC) 07/02/2013  . COPD exacerbation (HCC) 04/05/2015    Past Surgical History  Procedure Laterality Date  . Hemorrhoid surgery    . Colonoscopy    . Inguinal hernia repair Left 06/06/2012    Procedure: HERNIA REPAIR INGUINAL ADULT;  Surgeon: Shelly Rubensteinouglas A Blackman, MD;  Location: [facility] OR;  Service: General;  Laterality: Left;  . Insertion of mesh Left 06/06/2012    Procedure: INSERTION OF MESH;  Surgeon: Shelly Rubensteinouglas A Blackman, MD;  Location: [facility] OR;  Service: General;  Laterality: Left;  . Hernia repair Left 06/06/2012    Medications:  HOME MEDS: Prior to Admission medications   Medication Sig Start Date End Date Taking? Authorizing Provider  albuterol (PROVENTIL HFA;VENTOLIN HFA) 108 (90 BASE) MCG/ACT inhaler Inhale 2 puffs into the lungs every 4 (four) hours as needed for wheezing or shortness of breath. For shortness of breath   Yes Historical Provider, MD  amLODipine (NORVASC) 5 MG tablet Take 1 tablet by mouth daily. 03/22/15  Yes Historical Provider, MD  DULERA 100-5 MCG/ACT AERO Inhale 2 puffs into the  lungs 2 (two) times daily. 05/10/15  Yes Historical Provider, MD  lisinopril (PRINIVIL,ZESTRIL) 20 MG tablet Take 20 mg by mouth every morning.    Yes Historical Provider, MD  traZODone (DESYREL) 100 MG tablet Take 0.5-1 tablets (50-100 mg total) by mouth at bedtime. 04/18/15  Yes  Elliot Cousin, MD  feeding supplement, ENSURE ENLIVE, (ENSURE ENLIVE) LIQD Take 237 mLs by mouth 2 (two) times daily between meals. Patient not taking: Reported on 06/13/2015 04/18/15   Elliot Cousin, MD  ipratropium-albuterol (DUONEB) 0.5-2.5 (3) MG/3ML SOLN Take 3 mLs by nebulization 2 (two) times daily. Patient not taking: Reported on 07/09/2015 04/18/15   Elliot Cousin, MD  nicotine (NICODERM CQ - DOSED IN MG/24 HOURS) 21 mg/24hr patch Place 1 patch (21 mg total) onto the skin daily. Patient not taking: Reported on 07/06/2015 04/18/15   Elliot Cousin, MD  predniSONE (DELTASONE) 10 MG tablet Take with breakfast daily for 2 more days. Patient not taking: Reported on 07/09/2015 04/18/15   Elliot Cousin, MD  risperiDONE (RISPERDAL) 0.25 MG tablet Take 1 tablet (0.25 mg total) by mouth at bedtime. Patient not taking: Reported on 07/13/2015 04/18/15   Elliot Cousin, MD  senna-docusate (SENOKOT-S) 8.6-50 MG tablet Take 1 tablet by mouth 2 (two) times daily. Patient not taking: Reported on 06/18/2015 04/18/15   Elliot Cousin, MD     Allergies:  Allergies  Allergen Reactions  . Oxycodone Nausea And Vomiting    Social History:   reports that he has been smoking.  He does not have any smokeless tobacco history on file. He reports that he does not drink alcohol or use illicit drugs.  Family History: Family History  Problem Relation Age of Onset  . Stroke Mother   . Deep vein thrombosis Father   . Lung cancer Sister   . Stroke Sister   . Hypertension Sister   . Heart disease Brother   . Stroke Brother   . Stroke Sister   . Heart disease Sister      Physical Exam: Filed Vitals:   06/17/2015 1000 06/21/2015 1030 06/25/2015 1100 07/05/2015 1130  BP: 119/74 143/67 131/68 114/73  Pulse: 95 94 86 68  Temp:      TempSrc:      Resp: Height:      Weight:      SpO2: 100% 100% 100% 98%   Blood pressure 114/73, pulse 68, temperature 97.5 F (36.4 C), temperature source Oral, resp. rate 26, height  6' (1.829 m), weight 63.504 kg (140 lb), SpO2 98 %.  GEN:  Pleasant patient lying in the stretcher in no acute distress; cooperative with exam. PSYCH:  alert and oriented x4; does not appear anxious or depressed; affect is appropriate. HEENT: Mucous membranes pink and anicteric; PERRLA; EOM intact; no cervical lymphadenopathy nor thyromegaly or carotid bruit; no JVD; There were no stridor. Neck is very supple. Breasts:: Not examined CHEST WALL: No tenderness CHEST: Normal respiration, clear to auscultation bilaterally.  HEART: Regular rate and rhythm.  There are no murmur, rub, or gallops.   BACK: No kyphosis or scoliosis; no CVA tenderness ABDOMEN: soft and non-tender; no masses, no organomegaly, normal abdominal bowel sounds; no pannus; no intertriginous candida. There is no rebound and no distention. Rectal Exam: Not done EXTREMITIES: No bone or joint deformity; age-appropriate arthropathy of the hands and knees; no edema; no ulcerations.  There is no calf tenderness. Genitalia: not examined PULSES: 2+ and symmetric SKIN: Normal hydration no rash or ulceration CNS:  Cranial nerves 2-12 grossly intact no focal lateralizing neurologic deficit.  Speech is fluent; uvula elevated with phonation, facial symmetry and tongue midline. DTR are normal bilaterally, cerebella exam is intact, barbinski is negative and strengths are equaled bilaterally.  No sensory loss.   Labs on Admission:  Basic Metabolic Panel:  Recent Labs Lab 07/09/2015 0726  NA 142  K 4.2  CL 96*  CO2 34*  GLUCOSE 149*  BUN 48*  CREATININE 0.84  CALCIUM 9.4    CBC:  Recent Labs Lab 06/20/2015 0726  WBC 8.2  NEUTROABS 6.4  HGB 12.4*  HCT 39.9  MCV 97.8  PLT 220   Cardiac Enzymes:  Recent Labs Lab 07/07/2015 0726  TROPONINI <0.03    Radiological Exams on Admission: Dg Chest Portable 1 View  Jun 24, 2015  CLINICAL DATA:  70 year old male with cough and shortness of Breath for 1 week, increasing for 2 days.  Smoker, COPD. Initial encounter. EXAM: PORTABLE CHEST 1 VIEW COMPARISON:  04/16/2015 and earlier. FINDINGS: Portable AP upright view at 0730 hours. Chronic large lung volumes. Stable cardiac size and mediastinal contours. No pneumothorax or pleural effusion. Continued peripheral and interstitial/nodular pulmonary opacity in both lungs which has not significantly changed compared to 04/11/2015 - but has significantly progressed since 2013. No acute pulmonary opacity identified. Chronic right posterior fifth rib fracture suspected. IMPRESSION: Chronic lung disease which has progressed since 2013, but no superimposed acute findings are identified. Electronically Signed   By: Odessa Fleming M.D.   On: 06/26/2015 07:43    EKG: Independently reviewed.    Assessment/Plan Present on Admission:  . Acute respiratory failure with hypercapnia (HCC) . COPD with exacerbation (HCC) . Encephalopathy, metabolic . Protein calorie malnutrition (HCC) . Tobacco abuse . Essential hypertension . Acute on chronic respiratory failure (HCC)    PLAN:  Acute on chronic respiratory failure:  Hypercapneic and hypoxic.  He needs to be intubated.  Spoke with EDP and will proceed with intubation.  Will admit him to the ICU, continue with IV Steroids and start IV Levaquin.  Continue with nebs.  Place OG tube and foley.  Give PPI prophylaxis.   I will consult Dr Blase Mess who knows him well.   Spoke with his wife, and she felt that his COPD exacerbation have worsen, and at some point, she felt that palliative care consultation would be appropriate.  Will defer it at this time until he is over this acute illness.     Other plans as per orders.  Code Status: FULL Unk Lightning, MD. FACP [facility] Pager [phone] 7pm to 7am.  07/12/2015, 12:12 PM

## 2015-06-22 NOTE — ED Provider Notes (Signed)
CSN: [id number]     Arrival date & time 07-12-2015  4098 History   First MD Initiated Contact with Patient Jul 12, 2015 0720     Chief Complaint  Patient presents with  . Shortness of Breath     (Consider location/radiation/quality/duration/timing/severity/associated sxs/prior Treatment) HPI Comments: Patient has a previous history of intubation secondary to respiratory failure. Last time he was admitted, he required BiPAP. Patient continues to smoke.   Patient is a 70 y.o. male presenting with shortness of breath.  Shortness of Breath Severity:  Severe Onset quality:  Gradual Duration:  7 days Timing:  Constant Progression:  Worsening Chronicity:  Chronic Context: activity   Relieved by:  Nothing Ineffective treatments:  Inhaler and oxygen Associated symptoms: cough and sputum production   Associated symptoms: no abdominal pain, no chest pain, no diaphoresis, no fever, no hemoptysis, no sore throat, no syncope, no vomiting and no wheezing   Risk factors: tobacco use       Past Medical History  Diagnosis Date  . COPD (chronic obstructive pulmonary disease) (HCC)   . Hypertension   . Tobacco abuse   . Anxiety   . Anemia 04/24/2011  . Pneumonia     hospitalized 04/2011  . Shortness of breath     with exertion  . GERD (gastroesophageal reflux disease)   . Constipation   . Eczema   . Kidney stones   . Dementia with behavioral disturbance 04/18/2015  . Acute respiratory failure with hypoxia and hypercarbia (HCC) 07/02/2013  . COPD exacerbation (HCC) 04/05/2015   Past Surgical History  Procedure Laterality Date  . Hemorrhoid surgery    . Colonoscopy    . Inguinal hernia repair Left 06/06/2012    Procedure: HERNIA REPAIR INGUINAL ADULT;  Surgeon: Shelly Rubenstein, MD;  Location: [facility] OR;  Service: General;  Laterality: Left;  . Insertion of mesh Left 06/06/2012    Procedure: INSERTION OF MESH;  Surgeon: Shelly Rubenstein, MD;  Location: [facility] OR;  Service: General;  Laterality: Left;   . Hernia repair Left 06/06/2012   Family History  Problem Relation Age of Onset  . Stroke Mother   . Deep vein thrombosis Father   . Lung cancer Sister   . Stroke Sister   . Hypertension Sister   . Heart disease Brother   . Stroke Brother   . Stroke Sister   . Heart disease Sister    Social History  Substance Use Topics  . Smoking status: Current Every Day Smoker -- 0.50 packs/day    Last Attempt to Quit: 05/22/2011  . Smokeless tobacco: None     Comment: 1 pack every 2 days x 44 yrs.   . Alcohol Use: No    Review of Systems  Constitutional: Negative for fever, chills and diaphoresis.  HENT: Positive for rhinorrhea. Negative for sneezing and sore throat.   Respiratory: Positive for cough, sputum production, chest tightness and shortness of breath. Negative for hemoptysis and wheezing.   Cardiovascular: Negative for chest pain, palpitations and syncope.  Gastrointestinal: Negative for nausea, vomiting, abdominal pain, diarrhea and constipation.  Genitourinary: Negative for dysuria.  Psychiatric/Behavioral: Positive for confusion.  All other systems reviewed and are negative.     Allergies  Oxycodone  Home Medications   Prior to Admission medications   Medication Sig Start Date End Date Taking? Authorizing Provider  albuterol (PROVENTIL HFA;VENTOLIN HFA) 108 (90 BASE) MCG/ACT inhaler Inhale 2 puffs into the lungs every 4 (four) hours as needed for wheezing or shortness of breath.  For shortness of breath    Historical Provider, MD  feeding supplement, ENSURE ENLIVE, (ENSURE ENLIVE) LIQD Take 237 mLs by mouth 2 (two) times daily between meals. 04/18/15   Elliot Cousinenise Fisher, MD  fluticasone furoate-vilanterol (BREO ELLIPTA) 100-25 MCG/INH AEPB Inhale 1 puff into the lungs daily.    Historical Provider, MD  ipratropium-albuterol (DUONEB) 0.5-2.5 (3) MG/3ML SOLN Take 3 mLs by nebulization 2 (two) times daily. 04/18/15   Elliot Cousinenise Fisher, MD  lisinopril (PRINIVIL,ZESTRIL) 20 MG tablet  Take 20 mg by mouth every morning.     Historical Provider, MD  nicotine (NICODERM CQ - DOSED IN MG/24 HOURS) 21 mg/24hr patch Place 1 patch (21 mg total) onto the skin daily. 04/18/15   Elliot Cousinenise Fisher, MD  predniSONE (DELTASONE) 10 MG tablet Take with breakfast daily for 2 more days. 04/18/15   Elliot Cousinenise Fisher, MD  risperiDONE (RISPERDAL) 0.25 MG tablet Take 1 tablet (0.25 mg total) by mouth at bedtime. 04/18/15   Elliot Cousinenise Fisher, MD  senna-docusate (SENOKOT-S) 8.6-50 MG tablet Take 1 tablet by mouth 2 (two) times daily. 04/18/15   Elliot Cousinenise Fisher, MD  traZODone (DESYREL) 100 MG tablet Take 0.5-1 tablets (50-100 mg total) by mouth at bedtime. 04/18/15   Elliot Cousinenise Fisher, MD   BP 145/73 mmHg  Pulse 98  Temp(Src) 97.5 F (36.4 C) (Oral)  Resp 26  Ht 6' (1.829 m)  Wt 63.504 kg  BMI 18.98 kg/m2  SpO2 98% Physical Exam  Constitutional: He is oriented to person, place, and time. He appears well-developed.  HENT:  Mouth/Throat: Mucous membranes are dry.  Eyes: Conjunctivae are normal. Pupils are equal, round, and reactive to light.  Cardiovascular: Normal rate, regular rhythm, normal heart sounds and normal pulses.   No murmur heard. Pulmonary/Chest: Accessory muscle usage present. Tachypnea noted. He has decreased breath sounds. He has wheezes. He has no rhonchi. He has no rales.  Abdominal: Soft. Bowel sounds are normal. He exhibits no distension. There is no tenderness. There is no rebound and no guarding.  Musculoskeletal: Normal range of motion. He exhibits no edema or tenderness.  Neurological: He is alert and oriented to person, place, and time.  Skin: Skin is warm and dry. He is not diaphoretic.  Skin tenting    ED Course  Procedures (including critical care time) Labs Review Labs Reviewed  CBC WITH DIFFERENTIAL/PLATELET  BASIC METABOLIC PANEL  TROPONIN I  BLOOD GAS, ARTERIAL    Imaging Review Dg Chest Portable 1 View  06-23-15  CLINICAL DATA:  70 year old male with cough and shortness of  Breath for 1 week, increasing for 2 days. Smoker, COPD. Initial encounter. EXAM: PORTABLE CHEST 1 VIEW COMPARISON:  04/16/2015 and earlier. FINDINGS: Portable AP upright view at 0730 hours. Chronic large lung volumes. Stable cardiac size and mediastinal contours. No pneumothorax or pleural effusion. Continued peripheral and interstitial/nodular pulmonary opacity in both lungs which has not significantly changed compared to 04/11/2015 - but has significantly progressed since 2013. No acute pulmonary opacity identified. Chronic right posterior fifth rib fracture suspected. IMPRESSION: Chronic lung disease which has progressed since 2013, but no superimposed acute findings are identified. Electronically Signed   By: Odessa FlemingH  Hall M.D.   On: 005-11-17 07:43   I have personally reviewed and evaluated these images and lab results as part of my medical decision-making.   EKG Interpretation None      8:00: patient's ABG with significant hypercapneic (pCO2 of 94.5 mmHg) respiratory failure and pH of 7.189. S/p Duoneb and solu-medrol 80mg  x1. Continues albuterol  and BiPAP ordered. Will continue to reassess patient.  9:20: patient reassessed. Appears to be feeling better. Still has increased accessory muscle usage. RR in 25-30 range. Wheezes slightly improved but continued diminished breath sounds bilaterally. He is tolerating BiPAP well. Will obtain repeat ABG.  10:08: patient's ABG returned significant for improved pH (7.215) and pCO2 (86.1 mmHg). Patient continues to be more aroused.   MDM   Final diagnoses:  Hypoxia  Acute dyspnea   Patient presentation significant for COPD exacerbation leading to acute hypercapneic respiratory failure. Patient required intubation to ensure proper airway protection. Patient covered with IV steroids, CAT/Duonebs. [facility] called for admission. Will admit to ICU. Wife aware and in agreement with plan.    Narda Bonds, MD 07/06/2015 1528  Blane Ohara,  MD 06/26/15 [phone]

## 2015-06-22 NOTE — ED Notes (Signed)
Drs Caleb PoppNettey and Jodi MourningZavitz at bedside evaluating pt after ABG results.

## 2015-06-23 LAB — BLOOD GAS, ARTERIAL
ACID-BASE EXCESS: 4 mmol/L — AB (ref 0.0–2.0)
ACID-BASE EXCESS: 3.2 mmol/L — AB (ref 0.0–2.0)
Acid-Base Excess: 3.9 mmol/L — ABNORMAL HIGH (ref 0.0–2.0)
BICARBONATE: 28.4 meq/L — AB (ref 20.0–24.0)
BICARBONATE: 27.5 meq/L — AB (ref 20.0–24.0)
Bicarbonate: 26.2 mEq/L — ABNORMAL HIGH (ref 20.0–24.0)
DRAWN BY: 277331
DRAWN BY: 22223
Drawn by: 277331
FIO2: 0.4
FIO2: 0.4
FIO2: 40
LHR: 14 {breaths}/min
MECHVT: 550 mL
O2 SAT: 98.2 %
O2 SAT: 99.3 %
O2 Saturation: 99.4 %
PATIENT TEMPERATURE: 37
PATIENT TEMPERATURE: 37
PCO2 ART: 58.8 mmHg — AB (ref 35.0–45.0)
PCO2 ART: 50.4 mmHg — AB (ref 35.0–45.0)
PEEP/CPAP: 5 cmH2O
PEEP/CPAP: 5 cmH2O
PEEP: 5 cmH2O
PH ART: 7.313 — AB (ref 7.350–7.450)
PH ART: 7.374 (ref 7.350–7.450)
Patient temperature: 37
RATE: 12 resp/min
RATE: 12 resp/min
TCO2: 13.3 mmol/L (ref 0–100)
VT: 600 mL
VT: 550 mL
pCO2 arterial: 34 mmHg — ABNORMAL LOW (ref 35.0–45.0)
pH, Arterial: 7.513 — ABNORMAL HIGH (ref 7.350–7.450)
pO2, Arterial: 180 mmHg — ABNORMAL HIGH (ref 80.0–100.0)
pO2, Arterial: 130 mmHg — ABNORMAL HIGH (ref 80.0–100.0)
pO2, Arterial: 163 mmHg — ABNORMAL HIGH (ref 80.0–100.0)

## 2015-06-23 LAB — CBC
HCT: 31.1 % — ABNORMAL LOW (ref 39.0–52.0)
Hemoglobin: 10.1 g/dL — ABNORMAL LOW (ref 13.0–17.0)
MCH: 30.9 pg (ref 26.0–34.0)
MCHC: 32.5 g/dL (ref 30.0–36.0)
MCV: 95.1 fL (ref 78.0–100.0)
PLATELETS: 195 10*3/uL (ref 150–400)
RBC: 3.27 MIL/uL — AB (ref 4.22–5.81)
RDW: 14.1 % (ref 11.5–15.5)
WBC: 5.1 10*3/uL (ref 4.0–10.5)

## 2015-06-23 LAB — BASIC METABOLIC PANEL
ANION GAP: 6 (ref 5–15)
BUN: 44 mg/dL — ABNORMAL HIGH (ref 6–20)
CHLORIDE: 106 mmol/L (ref 101–111)
CO2: 30 mmol/L (ref 22–32)
Calcium: 8.4 mg/dL — ABNORMAL LOW (ref 8.9–10.3)
Creatinine, Ser: 0.79 mg/dL (ref 0.61–1.24)
Glucose, Bld: 216 mg/dL — ABNORMAL HIGH (ref 65–99)
POTASSIUM: 3.3 mmol/L — AB (ref 3.5–5.1)
Sodium: 142 mmol/L (ref 135–145)

## 2015-06-23 LAB — TRIGLYCERIDES: TRIGLYCERIDES: 98 mg/dL (ref <150)

## 2015-06-23 MED ORDER — FENTANYL CITRATE (PF) 100 MCG/2ML IJ SOLN
50.0000 ug | INTRAMUSCULAR | Status: DC | PRN
  Administered 2015-06-23: 50 ug via INTRAVENOUS
  Filled 2015-06-23: qty 2

## 2015-06-23 NOTE — Progress Notes (Signed)
**Note De-Identified  Obfuscation** ETT advanced to 26cm at lip where previously positioned.  ETT had slipped to 17cm.  BBS diminished. Will repeat ABG in 1 hour.

## 2015-06-23 NOTE — Care Management (Signed)
Pt is from home with wife. Pt currently intubated and no family present. Assessment not appropriate at this time. Will cont to follow.

## 2015-06-23 NOTE — Consult Note (Signed)
Consult requested by: Dr. Marin Comment Consult requested for acute on chronic respiratory failure:  HPI: This is a 70 year old who has a long history of COPD with chronic hypoxic respiratory failure. He was hospitalized here several months ago with respiratory failure and was discharged to a skilled care facility. He eventually left that facility [facility]. He has been prescribed BiPAP but doesn't use it. He continues to smoke. All of the history is from the medical record because he is intubated and sedated and unable to give any history. During his last hospitalization he had significant issues with confusion and is felt to have dementia as well.  Past Medical History  Diagnosis Date  . COPD (chronic obstructive pulmonary disease) ([place])   . Hypertension   . Tobacco abuse   . Anxiety   . Anemia 04/24/2011  . Pneumonia     hospitalized 04/2011  . Shortness of breath     with exertion  . GERD (gastroesophageal reflux disease)   . Constipation   . Eczema   . Kidney stones   . Dementia with behavioral disturbance 04/18/2015  . Acute respiratory failure with hypoxia and hypercarbia ([place]) 07/02/2013  . COPD exacerbation ([place]) 04/05/2015     Family History  Problem Relation Age of Onset  . Stroke Mother   . Deep vein thrombosis Father   . Lung cancer Sister   . Stroke Sister   . Hypertension Sister   . Heart disease Brother   . Stroke Brother   . Stroke Sister   . Heart disease Sister      Social History   Social History  . Marital Status: Married    Spouse Name: N/A  . Number of Children: N/A  . Years of Education: N/A   Social History Main Topics  . Smoking status: Current Every Day Smoker -- 0.50 packs/day    Last Attempt to Quit: 05/22/2011  . Smokeless tobacco: None     Comment: 1 pack every 2 days x 44 yrs.   . Alcohol Use: No  . Drug Use: No  . Sexual Activity: Yes   Other Topics Concern  . None   Social History Narrative     ROS: Not  obtainable    Objective: Vital signs in last 24 hours: Temp:  [96.1 F (35.6 C)-97.8 F (36.6 C)] 97.1 F (36.2 C) (05/11 0400) Pulse Rate:  [60-119] 77 (05/11 0430) Resp:  [10-29] 14 (05/11 0430) BP: (76-157)/(53-113) 120/72 mmHg (05/11 0430) SpO2:  [76 %-100 %] 100 % (05/11 0430) FiO2 (%):  [40 %] 40 % (05/11 0335) Weight:  [62 kg (136 lb 11 oz)-63.504 kg (140 lb)] 63.5 kg (139 lb 15.9 oz) (05/11 0500) Weight change:     Intake/Output from previous day: 05/10 0701 - 05/11 0700 In: 1842.6 [I.V.:1742.6; IV Piggyback:100] Out: 425 [Urine:425]  PHYSICAL EXAM He is intubated sedated and on the ventilator. Pupils react. Nose and throat are clear his chest shows diminished breath sounds and prolonged expiratory phase. His heart is regular. His abdomen is soft without masses. Bowel sounds present and active. Extremities showed no edema. Central nervous system examination really cannot be assessed  Lab Results: Basic Metabolic Panel:  Recent Labs  06/18/2015 0726 06/23/15 0430  NA 142 142  K 4.2 3.3*  CL 96* 106  CO2 34* 30  GLUCOSE 149* 216*  BUN 48* 44*  CREATININE 0.84 0.79  CALCIUM 9.4 8.4*   Liver Function Tests: No results for input(s): AST, ALT, ALKPHOS, BILITOT, PROT,  ALBUMIN in the last 72 hours. No results for input(s): LIPASE, AMYLASE in the last 72 hours. No results for input(s): AMMONIA in the last 72 hours. CBC:  Recent Labs  07/03/2015 0726 06/23/15 0430  WBC 8.2 5.1  NEUTROABS 6.4  --   HGB 12.4* 10.1*  HCT 39.9 31.1*  MCV 97.8 95.1  PLT 220 195   Cardiac Enzymes:  Recent Labs  07/08/2015 0726  TROPONINI <0.03   BNP: No results for input(s): PROBNP in the last 72 hours. D-Dimer: No results for input(s): DDIMER in the last 72 hours. CBG: No results for input(s): GLUCAP in the last 72 hours. Hemoglobin A1C: No results for input(s): HGBA1C in the last 72 hours. Fasting Lipid Panel:  Recent Labs  06/23/15 0430  TRIG 98   Thyroid  Function Tests: No results for input(s): TSH, T4TOTAL, FREET4, T3FREE, THYROIDAB in the last 72 hours. Anemia Panel: No results for input(s): VITAMINB12, FOLATE, FERRITIN, TIBC, IRON, RETICCTPCT in the last 72 hours. Coagulation: No results for input(s): LABPROT, INR in the last 72 hours. Urine Drug Screen: Drugs of Abuse     Component Value Date/Time   LABOPIA NONE DETECTED 04/23/2011 1259   COCAINSCRNUR NONE DETECTED 04/23/2011 1259   LABBENZ NONE DETECTED 04/23/2011 1259   AMPHETMU NONE DETECTED 04/23/2011 1259   THCU NONE DETECTED 04/23/2011 1259   LABBARB NONE DETECTED 04/23/2011 1259    Alcohol Level: No results for input(s): ETH in the last 72 hours. Urinalysis:  Recent Labs  06/21/2015 1125  COLORURINE YELLOW  LABSPEC >1.030*  PHURINE 6.0  GLUCOSEU NEGATIVE  HGBUR NEGATIVE  BILIRUBINUR NEGATIVE  KETONESUR NEGATIVE  PROTEINUR 30*  NITRITE NEGATIVE  LEUKOCYTESUR NEGATIVE   Misc. Labs:   ABGS:  Recent Labs  06/14/2015 1847  PHART 7.506*  PO2ART 158.0*  HCO3 30.8*     MICROBIOLOGY: Recent Results (from the past 240 hour(s))  MRSA PCR Screening     Status: None   Collection Time: 07/02/2015  2:10 PM  Result Value Ref Range Status   MRSA by PCR NEGATIVE NEGATIVE Final    Comment:        The GeneXpert MRSA Assay (FDA approved for NASAL specimens only), is one component of a comprehensive MRSA colonization surveillance program. It is not intended to diagnose MRSA infection nor to guide or monitor treatment for MRSA infections.     Studies/Results: Dg Chest Portable 1 View  07/07/2015  CLINICAL DATA:  70 year old male with cough and shortness of Breath for 1 week, increasing for 2 days. Smoker, COPD. Initial encounter. EXAM: PORTABLE CHEST 1 VIEW COMPARISON:  04/16/2015 and earlier. FINDINGS: Portable AP upright view at 0730 hours. Chronic large lung volumes. Stable cardiac size and mediastinal contours. No pneumothorax or pleural effusion. Continued  peripheral and interstitial/nodular pulmonary opacity in both lungs which has not significantly changed compared to 04/11/2015 - but has significantly progressed since 2013. No acute pulmonary opacity identified. Chronic right posterior fifth rib fracture suspected. IMPRESSION: Chronic lung disease which has progressed since 2013, but no superimposed acute findings are identified. Electronically Signed   By: Genevie Ann M.D.   On: 07/04/2015 07:43   Dg Chest Port 1v Same Day  07/11/2015  CLINICAL DATA:  Endotracheal tube readjustment. EXAM: PORTABLE CHEST 1 VIEW COMPARISON:  Same day. FINDINGS: The heart size and mediastinal contours are within normal limits. Nasogastric tube is seen entering stomach. Distal tip of endotracheal tube is approximately 6.5 cm above the carina. No pneumothorax is noted. Hyperexpansion of  the lungs is noted. Stable biapical scarring is noted. No significant pulmonary changed compared to prior exam. The visualized skeletal structures are unremarkable. IMPRESSION: Endotracheal tube tip is 6.5 cm above the carina. Nasogastric tube is seen entering stomach, although exact position of tip cannot be determined. Electronically Signed   By: Marijo Conception, M.D.   On: 07/04/2015 13:40   Dg Chest Port 1v Same Day  06/27/2015  CLINICAL DATA:  Post intubation EXAM: PORTABLE CHEST 1 VIEW COMPARISON:  Portable exam 1218 hours compared to 06/25/2015 FINDINGS: Tip of endotracheal tube projects approximately 8.9 cm above carina, located at thoracic inlet; this can be advanced 4 cm if desired for more stable positioning within the trachea. Tip of nasogastric tube projects over stomach though the proximal side-port projects over the distal esophagus, recommend advancing tube 7-8 cm. Normal heart size, mediastinal contours, and pulmonary vascularity. Atherosclerotic calcification aorta. Emphysematous and bronchitic changes consistent with COPD. Biapical scarring. Mild atelectasis LEFT base. No definite  infiltrate, pleural effusion or pneumothorax. IMPRESSION: May consider advancing endotracheal tube 4 cm for more stable positioning above the carina as above. Recommend advancing nasogastric tube 7 cm. COPD changes with atelectasis LEFT base. Findings called to Dr. Reather Converse On 06/21/2015 at 1253 hours. Electronically Signed   By: Lavonia Dana M.D.   On: 06/16/2015 12:53    Medications:  Prior to Admission:  Prescriptions prior to admission  Medication Sig Dispense Refill Last Dose  . albuterol (PROVENTIL HFA;VENTOLIN HFA) 108 (90 BASE) MCG/ACT inhaler Inhale 2 puffs into the lungs every 4 (four) hours as needed for wheezing or shortness of breath. For shortness of breath   06/16/2015 at Unknown time  . amLODipine (NORVASC) 5 MG tablet Take 1 tablet by mouth daily.   06/21/2015 at Unknown time  . DULERA 100-5 MCG/ACT AERO Inhale 2 puffs into the lungs 2 (two) times daily.   06/29/2015 at Unknown time  . lisinopril (PRINIVIL,ZESTRIL) 20 MG tablet Take 20 mg by mouth every morning.    06/21/2015 at Unknown time  . traZODone (DESYREL) 100 MG tablet Take 0.5-1 tablets (50-100 mg total) by mouth at bedtime. 30 tablet 1 06/21/2015 at Unknown time  . feeding supplement, ENSURE ENLIVE, (ENSURE ENLIVE) LIQD Take 237 mLs by mouth 2 (two) times daily between meals. (Patient not taking: Reported on 06/18/2015)     . ipratropium-albuterol (DUONEB) 0.5-2.5 (3) MG/3ML SOLN Take 3 mLs by nebulization 2 (two) times daily. (Patient not taking: Reported on 06/19/2015)     . nicotine (NICODERM CQ - DOSED IN MG/24 HOURS) 21 mg/24hr patch Place 1 patch (21 mg total) onto the skin daily. (Patient not taking: Reported on 06/13/2015)     . predniSONE (DELTASONE) 10 MG tablet Take with breakfast daily for 2 more days. (Patient not taking: Reported on 06/25/2015)     . risperiDONE (RISPERDAL) 0.25 MG tablet Take 1 tablet (0.25 mg total) by mouth at bedtime. (Patient not taking: Reported on 06/30/2015) 30 tablet 1   . senna-docusate  (SENOKOT-S) 8.6-50 MG tablet Take 1 tablet by mouth 2 (two) times daily. (Patient not taking: Reported on 07/09/2015)      Scheduled: . albuterol  2.5 mg Nebulization Q6H  . antiseptic oral rinse  7 mL Mouth Rinse QID  . chlorhexidine gluconate (SAGE KIT)  15 mL Mouth Rinse BID  . enoxaparin (LOVENOX) injection  40 mg Subcutaneous Q24H  . famotidine (PEPCID) IV  20 mg Intravenous Q12H  . levofloxacin (LEVAQUIN) IV  750 mg Intravenous Q24H  .  methylPREDNISolone (SOLU-MEDROL) injection  60 mg Intravenous Q6H  . sodium chloride flush  3 mL Intravenous Q12H   Continuous: . dextrose 5 % and 0.9% NaCl 100 mL/hr at 06/23/15 0400  . propofol (DIPRIVAN) infusion 59.055 mcg/kg/min (06/23/15 0709)   TGA:[id number], ondansetron **OR** ondansetron (ZOFRAN) IV  Assesment:He has acute on chronic respiratory failure with hypoxia and hypercapnia. He is encephalopathic likely metabolic from his acute illness. At baseline he has COPD and he is having a COPD exacerbation. Also at baseline he has some dementia and had significant issues with dementia while he was in the hospital last time. Principal Problem:   Acute respiratory failure with hypercapnia ([facility]) Active Problems:   COPD with exacerbation ([facility])   Tobacco abuse   Essential hypertension   Protein calorie malnutrition ([facility])   Encephalopathy, metabolic   Acute on chronic respiratory failure ([place])    Plan: Based on his blood gas I think he is being over ventilated somewhat right now. Will adjust his ventilator settings. Continue with other treatments. We can attempt weaning today but I doubt he is ready.  Thanks for allowing me to see him with you    LOS: 1 day   Carole Deere L 06/23/2015, 7:09 AM

## 2015-06-23 NOTE — Progress Notes (Signed)
Initial Nutrition Assessment  INTERVENTION:  If pt unable to wean successfully by tomorrow recommend to consider: Initiate Vital HP @ 40 ml/hr via OGT. Tube feeding will provide 960 kcal, 84 grams of protein, and 802 ml of H2O which will meet 100% of energy (with propofol included) and 88% of protein needs.  NUTRITION DIAGNOSIS:   Inadequate oral intake related to inability to eat as evidenced by  .  GOAL:   Provide needs based on ASPEN/SCCM guidelines  MONITOR:   Vent status, Labs, Weight trends  REASON FOR ASSESSMENT:   Ventilator    ASSESSMENT:   70 y.o. male with hx of severe COPD, refusal to wear Bipap at home, continued smoker, hx of HTN, anxiety, vascular dementia, presented to the ER with one week hx of progressive SOB and green sputum productive coughs and having confusion.   Patient is currently intubated on ventilator support MV: 7.1 L/min Temp (24hrs), Avg:97.3 F (36.3 C), Min:96.1 F (35.6 C), Max:97.9 F (36.6 C)  Propofol: 22.9 ml/hr (605 kcal lipids q 24 hr current rate)  Diet Order:  Diet NPO time specified  Skin:   pink, intact  Last BM:   PTA  Height:   Ht Readings from Last 1 Encounters:  02/14/15 6\' 1"  (1.854 m)    Weight:   Wt Readings from Last 1 Encounters:  06/23/15 139 lb 15.9 oz (63.5 kg)    Ideal Body Weight:  84 kg  BMI:  Body mass index is 18.47 kg/(m^2).  Estimated Nutritional Needs:   Kcal:  1580  Protein:  95-110 gr  Fluid:  >1500 ml daily  EDUCATION NEEDS:   No education needs identified at this time  Royann ShiversLynn Tyan Lasure MS,RD,CSG,LDN Office: #[phone] Pager: [phone]

## 2015-06-23 NOTE — Progress Notes (Signed)
[facility] PROGRESS NOTE  Shane Todd Chojnowski WUJ:[id number]:[id number] DOB: 06-28-1945    PCP:   Trinna PostKOBERLEIN, JUNELL CAROL, MD   HPI: Shane Todd Shane Todd is an 70 y.o. male with hx of severe COPD, continued smoker, dementia, refusal to wear Bipap at home, admitted with respiratory failure, hypoxic and hypercapnic and was intubated.  His baseline pCO2 is likely chronically elevated, and he is over-ventilated at this time.  No other events.   He was seen by Dr Juanetta GoslingHawkins this am as well.   Rewiew of Systems: Unable.    Past Medical History  Diagnosis Date  . COPD (chronic obstructive pulmonary disease) ([facility])   . Hypertension   . Tobacco abuse   . Anxiety   . Anemia 04/24/2011  . Pneumonia     hospitalized 04/2011  . Shortness of breath     with exertion  . GERD (gastroesophageal reflux disease)   . Constipation   . Eczema   . Kidney stones   . Dementia with behavioral disturbance 04/18/2015  . Acute respiratory failure with hypoxia and hypercarbia ([facility]) 07/02/2013  . COPD exacerbation ([facility]) 04/05/2015    Past Surgical History  Procedure Laterality Date  . Hemorrhoid surgery    . Colonoscopy    . Inguinal hernia repair Left 06/06/2012    Procedure: HERNIA REPAIR INGUINAL ADULT;  Surgeon: Shelly Rubensteinouglas Todd Blackman, MD;  Location: [facility] OR;  Service: General;  Laterality: Left;  . Insertion of mesh Left 06/06/2012    Procedure: INSERTION OF MESH;  Surgeon: Shelly Rubensteinouglas Todd Blackman, MD;  Location: [facility] OR;  Service: General;  Laterality: Left;  . Hernia repair Left 06/06/2012    Medications:  HOME MEDS: Prior to Admission medications   Medication Sig Start Date End Date Taking? Authorizing Provider  albuterol (PROVENTIL HFA;VENTOLIN HFA) 108 (90 BASE) MCG/ACT inhaler Inhale 2 puffs into the lungs every 4 (four) hours as needed for wheezing or shortness of breath. For shortness of breath   Yes Historical Provider, MD  amLODipine (NORVASC) 5 MG tablet Take 1 tablet by mouth daily. 03/22/15  Yes Historical Provider, MD   DULERA 100-5 MCG/ACT AERO Inhale 2 puffs into the lungs 2 (two) times daily. 05/10/15  Yes Historical Provider, MD  lisinopril (PRINIVIL,ZESTRIL) 20 MG tablet Take 20 mg by mouth every morning.    Yes Historical Provider, MD  traZODone (DESYREL) 100 MG tablet Take 0.5-1 tablets (50-100 mg total) by mouth at bedtime. 04/18/15  Yes Elliot Cousinenise Fisher, MD  feeding supplement, ENSURE ENLIVE, (ENSURE ENLIVE) LIQD Take 237 mLs by mouth 2 (two) times daily between meals. Patient not taking: Reported on 06/28/2015 04/18/15   Elliot Cousinenise Fisher, MD  ipratropium-albuterol (DUONEB) 0.5-2.5 (3) MG/3ML SOLN Take 3 mLs by nebulization 2 (two) times daily. Patient not taking: Reported on 07/04/2015 04/18/15   Elliot Cousinenise Fisher, MD  nicotine (NICODERM CQ - DOSED IN MG/24 HOURS) 21 mg/24hr patch Place 1 patch (21 mg total) onto the skin daily. Patient not taking: Reported on 07/12/2015 04/18/15   Elliot Cousinenise Fisher, MD  predniSONE (DELTASONE) 10 MG tablet Take with breakfast daily for 2 more days. Patient not taking: Reported on 07/10/2015 04/18/15   Elliot Cousinenise Fisher, MD  risperiDONE (RISPERDAL) 0.25 MG tablet Take 1 tablet (0.25 mg total) by mouth at bedtime. Patient not taking: Reported on 06/16/2015 04/18/15   Elliot Cousinenise Fisher, MD  senna-docusate (SENOKOT-S) 8.6-50 MG tablet Take 1 tablet by mouth 2 (two) times daily. Patient not taking: Reported on 06/18/2015 04/18/15   Elliot Cousinenise Fisher, MD  Allergies:  Allergies  Allergen Reactions  . Oxycodone Nausea And Vomiting    Social History:   reports that he has been smoking.  He does not have any smokeless tobacco history on file. He reports that he does not drink alcohol or use illicit drugs.  Family History: Family History  Problem Relation Age of Onset  . Stroke Mother   . Deep vein thrombosis Father   . Lung cancer Sister   . Stroke Sister   . Hypertension Sister   . Heart disease Brother   . Stroke Brother   . Stroke Sister   . Heart disease Sister      Physical Exam: Filed  Vitals:   06/23/15 0400 06/23/15 0430 06/23/15 0500 06/23/15 0741  BP: 130/72 120/72    Pulse: 84 77    Temp: 97.1 F (36.2 C)   97.7 F (36.5 C)  TempSrc: Axillary   Axillary  Resp: 16 14    Height:      Weight:   63.5 kg (139 lb 15.9 oz)   SpO2: 100% 100%     Blood pressure 120/72, pulse 77, temperature 97.7 F (36.5 C), temperature source Axillary, resp. rate 14, height 6\' 1"  (1.854 m), weight 63.5 kg (139 lb 15.9 oz), SpO2 100 %.  GEN:  Pleasant patient lying in the stretcher in no acute distress; cooperative with exam. HEENT: Mucous membranes pink and anicteric; PERRLA; EOM intact; no cervical lymphadenopathy nor thyromegaly or carotid bruit; no JVD; There were no stridor. Neck is very supple. Breasts:: Not examined CHEST WALL: No tenderness CHEST: bilateral BS, mechanically ventilated.  HEART: Regular rate and rhythm.  There are no murmur, rub, or gallops.   BACK: No kyphosis or scoliosis; no CVA tenderness ABDOMEN: soft and non-tender; no masses, no organomegaly, normal abdominal bowel sounds; no pannus; no intertriginous candida. There is no rebound and no distention. Rectal Exam: Not done EXTREMITIES: No bone or joint deformity; age-appropriate arthropathy of the hands and knees; no edema; no ulcerations.  There is no calf tenderness. Genitalia: not examined PULSES: 2+ and symmetric SKIN: Normal hydration no rash or ulceration CNS: Cranial nerves 2-12 grossly intact no focal lateralizing neurologic deficit.  Speech is fluent; uvula elevated with phonation, facial symmetry and tongue midline. DTR are normal bilaterally, cerebella exam is intact, barbinski is negative and strengths are equaled bilaterally.  No sensory loss.   Labs on Admission:  Basic Metabolic Panel:  Recent Labs Lab 06/16/2015 0726 06/23/15 0430  NA 142 142  K 4.2 3.3*  CL 96* 106  CO2 34* 30  GLUCOSE 149* 216*  BUN 48* 44*  CREATININE 0.84 0.79  CALCIUM 9.4 8.4*   Liver Function  Tests: CBC:  Recent Labs Lab 06/23/2015 0726 06/23/15 0430  WBC 8.2 5.1  NEUTROABS 6.4  --   HGB 12.4* 10.1*  HCT 39.9 31.1*  MCV 97.8 95.1  PLT 220 195   Cardiac Enzymes:  Recent Labs Lab 06/29/2015 0726  TROPONINI <0.03   Radiological Exams on Admission: Dg Chest Portable 1 View  06/21/2015  CLINICAL DATA:  70 year old male with cough and shortness of Breath for 1 week, increasing for 2 days. Smoker, COPD. Initial encounter. EXAM: PORTABLE CHEST 1 VIEW COMPARISON:  04/16/2015 and earlier. FINDINGS: Portable AP upright view at 0730 hours. Chronic large lung volumes. Stable cardiac size and mediastinal contours. No pneumothorax or pleural effusion. Continued peripheral and interstitial/nodular pulmonary opacity in both lungs which has not significantly changed compared to 04/11/2015 - but has  significantly progressed since 2013. No acute pulmonary opacity identified. Chronic right posterior fifth rib fracture suspected. IMPRESSION: Chronic lung disease which has progressed since 2013, but no superimposed acute findings are identified. Electronically Signed   By: Odessa Fleming M.D.   On: 06/30/2015 07:43   Dg Chest Port 1v Same Day  06/30/2015  CLINICAL DATA:  Endotracheal tube readjustment. EXAM: PORTABLE CHEST 1 VIEW COMPARISON:  Same day. FINDINGS: The heart size and mediastinal contours are within normal limits. Nasogastric tube is seen entering stomach. Distal tip of endotracheal tube is approximately 6.5 cm above the carina. No pneumothorax is noted. Hyperexpansion of the lungs is noted. Stable biapical scarring is noted. No significant pulmonary changed compared to prior exam. The visualized skeletal structures are unremarkable. IMPRESSION: Endotracheal tube tip is 6.5 cm above the carina. Nasogastric tube is seen entering stomach, although exact position of tip cannot be determined. Electronically Signed   By: Lupita Raider, M.D.   On: 07/03/2015 13:40   Dg Chest Port 1v Same  Day  06/17/2015  CLINICAL DATA:  Post intubation EXAM: PORTABLE CHEST 1 VIEW COMPARISON:  Portable exam 1218 hours compared to 07/03/2015 FINDINGS: Tip of endotracheal tube projects approximately 8.9 cm above carina, located at thoracic inlet; this can be advanced 4 cm if desired for more stable positioning within the trachea. Tip of nasogastric tube projects over stomach though the proximal side-port projects over the distal esophagus, recommend advancing tube 7-8 cm. Normal heart size, mediastinal contours, and pulmonary vascularity. Atherosclerotic calcification aorta. Emphysematous and bronchitic changes consistent with COPD. Biapical scarring. Mild atelectasis LEFT base. No definite infiltrate, pleural effusion or pneumothorax. IMPRESSION: May consider advancing endotracheal tube 4 cm for more stable positioning above the carina as above. Recommend advancing nasogastric tube 7 cm. COPD changes with atelectasis LEFT base. Findings called to Dr. Jodi Mourning On 06/21/2015 at 1253 hours. Electronically Signed   By: Ulyses Southward M.D.   On: 07/03/2015 12:53    EKG: Independently reviewed.   Assessment/Plan Present on Admission:  . Acute respiratory failure with hypercapnia (HCC) . COPD with exacerbation (HCC) . Encephalopathy, metabolic . Protein calorie malnutrition (HCC) . Tobacco abuse . Essential hypertension . Acute on chronic respiratory failure ([facility])   PLAN:  Acute on chronic respiratory failure:  Appreciate Dr Adah Perl consultation.  Will decrease ventilation to keep pH around 7.35.   Continue with IV steroids, nebs, and IV antibiotics.  Unlikely to be able to wean off vent today.    HTN:  BP is controlled.  Tobacco use:  Deferred.   Other plans as per orders. Code Status: FULL Unk Lightning, MD.  FACP [facility] Pager [phone] 7pm to 7am.  06/23/2015, 8:25 AM

## 2015-06-24 ENCOUNTER — Inpatient Hospital Stay (HOSPITAL_COMMUNITY): Payer: Medicare Other

## 2015-06-24 ENCOUNTER — Encounter (HOSPITAL_COMMUNITY): Admission: EM | Disposition: E | Payer: Self-pay | Source: Home / Self Care | Attending: Internal Medicine

## 2015-06-24 DIAGNOSIS — K9189 Other postprocedural complications and disorders of digestive system: Secondary | ICD-10-CM

## 2015-06-24 DIAGNOSIS — K226 Gastro-esophageal laceration-hemorrhage syndrome: Secondary | ICD-10-CM

## 2015-06-24 DIAGNOSIS — K254 Chronic or unspecified gastric ulcer with hemorrhage: Secondary | ICD-10-CM

## 2015-06-24 DIAGNOSIS — K920 Hematemesis: Secondary | ICD-10-CM

## 2015-06-24 DIAGNOSIS — K228 Other specified diseases of esophagus: Secondary | ICD-10-CM

## 2015-06-24 HISTORY — PX: ESOPHAGOGASTRODUODENOSCOPY: SHX5428

## 2015-06-24 LAB — CBC
HCT: 36.2 % — ABNORMAL LOW (ref 39.0–52.0)
HCT: 37.2 % — ABNORMAL LOW (ref 39.0–52.0)
HCT: 33.6 % — ABNORMAL LOW (ref 39.0–52.0)
Hemoglobin: 11.4 g/dL — ABNORMAL LOW (ref 13.0–17.0)
Hemoglobin: 11.6 g/dL — ABNORMAL LOW (ref 13.0–17.0)
Hemoglobin: 10.4 g/dL — ABNORMAL LOW (ref 13.0–17.0)
MCH: 30.7 pg (ref 26.0–34.0)
MCH: 30.5 pg (ref 26.0–34.0)
MCH: 30.3 pg (ref 26.0–34.0)
MCHC: 31.5 g/dL (ref 30.0–36.0)
MCHC: 31.2 g/dL (ref 30.0–36.0)
MCHC: 31 g/dL (ref 30.0–36.0)
MCV: 97.6 fL (ref 78.0–100.0)
MCV: 97.9 fL (ref 78.0–100.0)
MCV: 98 fL (ref 78.0–100.0)
PLATELETS: 218 10*3/uL (ref 150–400)
PLATELETS: 192 10*3/uL (ref 150–400)
Platelets: 221 10*3/uL (ref 150–400)
RBC: 3.71 MIL/uL — ABNORMAL LOW (ref 4.22–5.81)
RBC: 3.8 MIL/uL — ABNORMAL LOW (ref 4.22–5.81)
RBC: 3.43 MIL/uL — ABNORMAL LOW (ref 4.22–5.81)
RDW: 15.1 % (ref 11.5–15.5)
RDW: 15.3 % (ref 11.5–15.5)
RDW: 15.4 % (ref 11.5–15.5)
WBC: 10.1 10*3/uL (ref 4.0–10.5)
WBC: 11.5 10*3/uL — ABNORMAL HIGH (ref 4.0–10.5)
WBC: 9.5 10*3/uL (ref 4.0–10.5)

## 2015-06-24 LAB — BLOOD GAS, ARTERIAL
ACID-BASE EXCESS: 3.7 mmol/L — AB (ref 0.0–2.0)
BICARBONATE: 27 meq/L — AB (ref 20.0–24.0)
Drawn by: 277331
FIO2: 0.4
LHR: 12 {breaths}/min
O2 SAT: 98.5 %
PEEP/CPAP: 5 cmH2O
PH ART: 7.34 — AB (ref 7.350–7.450)
Patient temperature: 37
VT: 550 mL
pCO2 arterial: 55.4 mmHg — ABNORMAL HIGH (ref 35.0–45.0)
pO2, Arterial: 132 mmHg — ABNORMAL HIGH (ref 80.0–100.0)

## 2015-06-24 SURGERY — EGD (ESOPHAGOGASTRODUODENOSCOPY)
Anesthesia: Moderate Sedation

## 2015-06-24 MED ORDER — BUTAMBEN-TETRACAINE-BENZOCAINE 2-2-14 % EX AERO
INHALATION_SPRAY | CUTANEOUS | Status: DC | PRN
  Administered 2015-06-24: 1 via TOPICAL

## 2015-06-24 MED ORDER — IPRATROPIUM BROMIDE 0.02 % IN SOLN
RESPIRATORY_TRACT | Status: AC
  Administered 2015-06-24: 0.5 mg
  Filled 2015-06-24: qty 2.5

## 2015-06-24 MED ORDER — STERILE WATER FOR IRRIGATION IR SOLN
Status: DC | PRN
  Administered 2015-06-24: 18:00:00

## 2015-06-24 MED ORDER — MEPERIDINE HCL 50 MG/ML IJ SOLN
INTRAMUSCULAR | Status: AC
  Filled 2015-06-24: qty 1

## 2015-06-24 MED ORDER — MIDAZOLAM HCL 5 MG/5ML IJ SOLN
INTRAMUSCULAR | Status: AC
  Filled 2015-06-24: qty 10

## 2015-06-24 MED ORDER — VITAL HIGH PROTEIN PO LIQD
1000.0000 mL | ORAL | Status: DC
  Administered 2015-06-25: 1000 mL
  Filled 2015-06-24 (×4): qty 1000

## 2015-06-24 MED ORDER — PANTOPRAZOLE SODIUM 40 MG IV SOLR: 40.0000 mg | Freq: Two times a day (BID) | INTRAVENOUS | Status: DC

## 2015-06-24 MED ORDER — BUTAMBEN-TETRACAINE-BENZOCAINE 2-2-14 % EX AERO
INHALATION_SPRAY | CUTANEOUS | Status: AC
Start: 2015-06-24 — End: 2015-06-25
  Filled 2015-06-24: qty 20

## 2015-06-24 MED ORDER — SODIUM CHLORIDE 0.9 % IV SOLN
8.0000 mg/h | INTRAVENOUS | Status: DC
  Administered 2015-06-24 – 2015-06-27 (×7): 8 mg/h via INTRAVENOUS
  Filled 2015-06-24 (×8): qty 80

## 2015-06-24 MED ORDER — SODIUM CHLORIDE 0.9 % IV SOLN
80.0000 mg | Freq: Once | INTRAVENOUS | Status: AC
  Administered 2015-06-24: 80 mg via INTRAVENOUS
  Filled 2015-06-24: qty 80

## 2015-06-24 MED ORDER — MIDAZOLAM HCL 5 MG/5ML IJ SOLN
INTRAMUSCULAR | Status: DC | PRN
  Administered 2015-06-24: 2 mg via INTRAVENOUS

## 2015-06-24 NOTE — Consult Note (Signed)
Referring Provider: Sinda Du, MD Primary Care Physician:  Rocky Morel, MD Primary Gastroenterologist:  Dr. Laural Golden  Reason for Consultation:    Upper GI bleed in a patient who is being treated for respiratory failure.  HPI:   Patient is intubated and sedated and unable to provide any history. Patient is 70 year old Caucasian male was history of COPD who came to emergency room with acute onset of dyspnea and he required intubation. His respiratory failure felt to be secondary to acute axis ablation of COPD. He has continued to smoke cigarettes. Earlier today he was noted to have coffee-ground material wire NG tube. Lovenox was discontinued. Patient was begun on IV pantoprazole and GI consultation requested. Patient has history of GERD not taking any prescription medication at the time of admission. There is no history of peptic ulcer disease. It is unclear if he is been taking NSAIDs at home.   Past Medical History  Diagnosis Date  . COPD (chronic obstructive pulmonary disease) ([place])   . Hypertension   . Tobacco abuse   . Anxiety   . Anemia 04/24/2011  . Pneumonia     hospitalized 04/2011  . Shortness of breath     with exertion  . GERD (gastroesophageal reflux disease)   . Constipation   . Eczema   . Kidney stones   . Dementia with behavioral disturbance 04/18/2015  . Acute respiratory failure with hypoxia and hypercarbia ([place]) 07/02/2013  . COPD exacerbation ([place]) 04/05/2015    Past Surgical History  Procedure Laterality Date  . Hemorrhoid surgery    . Colonoscopy    . Inguinal hernia repair Left 06/06/2012    Procedure: HERNIA REPAIR INGUINAL ADULT;  Surgeon: Harl Bowie, MD;  Location: [facility];  Service: General;  Laterality: Left;  . Insertion of mesh Left 06/06/2012    Procedure: INSERTION OF MESH;  Surgeon: Harl Bowie, MD;  Location: [facility];  Service: General;  Laterality: Left;  . Hernia repair Left 06/06/2012    Prior to Admission  medications   Medication Sig Start Date End Date Taking? Authorizing Provider  albuterol (PROVENTIL HFA;VENTOLIN HFA) 108 (90 BASE) MCG/ACT inhaler Inhale 2 puffs into the lungs every 4 (four) hours as needed for wheezing or shortness of breath. For shortness of breath   Yes Historical Provider, MD  amLODipine (NORVASC) 5 MG tablet Take 1 tablet by mouth daily. 03/22/15  Yes Historical Provider, MD  DULERA 100-5 MCG/ACT AERO Inhale 2 puffs into the lungs 2 (two) times daily. 05/10/15  Yes Historical Provider, MD  lisinopril (PRINIVIL,ZESTRIL) 20 MG tablet Take 20 mg by mouth every morning.    Yes Historical Provider, MD  traZODone (DESYREL) 100 MG tablet Take 0.5-1 tablets (50-100 mg total) by mouth at bedtime. 04/18/15  Yes Rexene Alberts, MD  feeding supplement, ENSURE ENLIVE, (ENSURE ENLIVE) LIQD Take 237 mLs by mouth 2 (two) times daily between meals. Patient not taking: Reported on 06/14/2015 04/18/15   Rexene Alberts, MD  ipratropium-albuterol (DUONEB) 0.5-2.5 (3) MG/3ML SOLN Take 3 mLs by nebulization 2 (two) times daily. Patient not taking: Reported on 06/20/2015 04/18/15   Rexene Alberts, MD  nicotine (NICODERM CQ - DOSED IN MG/24 HOURS) 21 mg/24hr patch Place 1 patch (21 mg total) onto the skin daily. Patient not taking: Reported on 07/05/2015 04/18/15   Rexene Alberts, MD  predniSONE (DELTASONE) 10 MG tablet Take with breakfast daily for 2 more days. Patient not taking: Reported on 06/27/2015 04/18/15   Rexene Alberts, MD  risperiDONE [facility])  0.25 MG tablet Take 1 tablet (0.25 mg total) by mouth at bedtime. Patient not taking: Reported on 07/13/2015 04/18/15   Rexene Alberts, MD  senna-docusate (SENOKOT-S) 8.6-50 MG tablet Take 1 tablet by mouth 2 (two) times daily. Patient not taking: Reported on 06/30/2015 04/18/15   Rexene Alberts, MD    Current Facility-Administered Medications  Medication Dose Route Frequency Provider Last Rate Last Dose  . albuterol (PROVENTIL) (2.5 MG/3ML) 0.083% nebulizer solution  2.5 mg  2.5 mg Nebulization Q6H Orvan Falconer, MD   2.5 mg at 06/23/2015 1407  . albuterol (PROVENTIL) (2.5 MG/3ML) 0.083% nebulizer solution 2.5 mg  2.5 mg Nebulization Q2H PRN Orvan Falconer, MD      . antiseptic oral rinse solution (CORINZ)  7 mL Mouth Rinse QID Orvan Falconer, MD   7 mL at 06/28/2015 1123  . chlorhexidine gluconate (SAGE KIT) (PERIDEX) 0.12 % solution 15 mL  15 mL Mouth Rinse BID Orvan Falconer, MD   15 mL at 06/20/2015 0734  . dextrose 5 %-0.9 % sodium chloride infusion   Intravenous Continuous Orvan Falconer, MD 100 mL/hr at 06/29/2015 1437    . feeding supplement (VITAL HIGH PROTEIN) liquid 1,000 mL  1,000 mL Per Tube Q24H Sinda Du, MD   Stopped at 07/10/2015 1226  . fentaNYL (SUBLIMAZE) injection 50 mcg  50 mcg Intravenous Q1H PRN Orvan Falconer, MD   50 mcg at 06/23/15 1734  . levofloxacin (LEVAQUIN) IVPB 750 mg  750 mg Intravenous Q24H Orvan Falconer, MD   750 mg at 06/23/2015 1442  . methylPREDNISolone sodium succinate (SOLU-MEDROL) 125 mg/2 mL injection 60 mg  60 mg Intravenous Q6H Orvan Falconer, MD   60 mg at 07/09/2015 1442  . ondansetron (ZOFRAN) tablet 4 mg  4 mg Oral Q6H PRN Orvan Falconer, MD       Or  . ondansetron [facility]) injection 4 mg  4 mg Intravenous Q6H PRN Orvan Falconer, MD      . pantoprazole (PROTONIX) 80 mg in sodium chloride 0.9 % 250 mL (0.32 mg/mL) infusion  8 mg/hr Intravenous Continuous Orvan Falconer, MD 25 mL/hr at 07/11/2015 1046 8 mg/hr at 06/29/2015 1046  . [START ON 06/27/2015] pantoprazole (PROTONIX) injection 40 mg  40 mg Intravenous Q12H Orvan Falconer, MD      . propofol (DIPRIVAN) 1000 MG/100ML infusion  5-80 mcg/kg/min Intravenous Titrated Orvan Falconer, MD 19.1 mL/hr at 06/13/2015 1427 50 mcg/kg/min at 06/13/2015 1427  . sodium chloride flush (NS) 0.9 % injection 3 mL  3 mL Intravenous Q12H Orvan Falconer, MD   3 mL at 06/19/2015 1048    Allergies as of 06/25/2015 - Review Complete 06/14/2015  Allergen Reaction Noted  . Oxycodone Nausea And Vomiting 06/24/2012    Family History  Problem Relation Age of Onset  . Stroke Mother    . Deep vein thrombosis Father   . Lung cancer Sister   . Stroke Sister   . Hypertension Sister   . Heart disease Brother   . Stroke Brother   . Stroke Sister   . Heart disease Sister     Social History   Social History  . Marital Status: Married    Spouse Name: N/A  . Number of Children: N/A  . Years of Education: N/A   Occupational History  . Not on file.   Social History Main Topics  . Smoking status: Current Every Day Smoker -- 0.50 packs/day    Last Attempt to Quit: 05/22/2011  . Smokeless tobacco: Not on file  Comment: 1 pack every 2 days x 44 yrs.   . Alcohol Use: No  . Drug Use: No  . Sexual Activity: Yes   Other Topics Concern  . Not on file   Social History Narrative    Review of Systems: See HPI, otherwise normal ROS  Physical Exam: Temp:  [96.6 F (35.9 C)-98.9 F (37.2 C)] 98.9 F (37.2 C) (05/12 1200) Pulse Rate:  [44-97] 82 (05/12 0900) Resp:  [0-30] 24 (05/12 0900) BP: (72-166)/(51-86) 157/84 mmHg (05/12 0900) SpO2:  [95 %-100 %] 100 % (05/12 1407) FiO2 (%):  [40 %] 40 % (05/12 1407) Weight:  [145 lb 4.5 oz (65.9 kg)] 145 lb 4.5 oz (65.9 kg) (05/12 0500)   Patient is well sedated and on ventilatory support does not respond to vocal stimuli. Conjunctiva was pink. Sclerae nonicteric. Pupils are equal and reactive to light. Oropharyngeal examination is limited because of ET tube. No neck masses or thyromegaly noted. Cardiac exam with regular rhythm normal S1 and S2. No murmur or gallop noted. Auscultation of lungs revealed diminished density of breath sounds bilaterally with few scattered rhonchi. Abdomen is symmetrical. Bowel sounds are normal. On palpation abdomen is soft and nontender. Liver edge 7 cm below RCM but soft. No peripheral edema or clubbing noted.  Recent Labs  06/23/15 0430 07/07/2015 0858 06/27/2015 1412  WBC 5.1 10.1 11.5*  HGB 10.1* 11.4* 11.6*  HCT 31.1* 36.2* 37.2*  PLT 195 218 221   BMET  Recent Labs   06/29/2015 0726 06/23/15 0430  NA 142 142  K 4.2 3.3*  CL 96* 106  CO2 34* 30  GLUCOSE 149* 216*  BUN 48* 44*  CREATININE 0.84 0.79  CALCIUM 9.4 8.4*    Studies/Results: US Abdomen Complete  07/08/2015  CLINICAL DATA:  Abdominal distension, ventilated patient, history of kidney stones. EXAM: ABDOMEN ULTRASOUND COMPLETE COMPARISON:  None in PACs FINDINGS: Gallbladder: The gallbladder is mildly distended and exhibits significant wall thickening to as much as 14 mm and pericholecystic fluid. No sludge or stones are evident. A reliable sonographic Murphy's sign could not be assessed due to the patient's clinical status. Common bile duct: Diameter: 2.4 mm Liver: The liver exhibits normal echotexture. There is no focal mass nor ductal dilation. There is a small amount of ascites in the right upper quadrant. IVC: No abnormality visualized. Pancreas: Visualization of the pancreatic head and body is normal. The pancreatic tail is obscured by bowel gas. Spleen: Size and appearance within normal limits. Right Kidney: Length: 11.2 cm. The renal cortical echotexture appears normal. There is a lower pole cystic structure measuring 2.7 x 1.8 x 1.9 cm. There is no hydronephrosis. There is a trace of perinephric fluid. Left Kidney: Length: 11.5 cm. Ectatic abdominal aortic wall measuring between 2.6 and 2.7 cm. There is mural calcification Abdominal aorta: No aneurysm visualized. Other findings: None. IMPRESSION: 1. No gallstones are demonstrated. Distention of the gallbladder with marked gallbladder wall thickening and pericholecystic fluid is consistent with acute acalculous cholecystitis. 2. Normal appearance of the liver. Small amount of ascites in the right upper quadrant and adjacent to the right kidney. 3. No hydronephrosis. There is a lower pole cyst in the right kidney. 4. Atherosclerotic changes of the abdominal aorta with maximal diameter of 2.7 mm. 5. These results will be called to the ordering clinician or  representative by the Radiologist Assistant, and communication documented in the PACS or zVision Dashboard. Electronically Signed   By: David  Martinique M.D.   On: 07/12/2015  13:10   Dg Chest Port 1 View  06/14/2015  CLINICAL DATA:  Respiratory failure EXAM: PORTABLE CHEST 1 VIEW COMPARISON:  07/05/2015 FINDINGS: The endotracheal tube tip is above the carina. The heart size appears normal. There is no pleural effusion or edema. Lungs are hyperinflated with diffuse coarsened interstitial markings noted bilaterally. IMPRESSION: 1. Satisfactory position of ET tube. 2. Hyperinflation and interstitial changes of advanced COPD. Electronically Signed   By: Kerby Moors M.D.   On: 06/28/2015 11:02    Assessment;  Patient is 70 year old Caucasian male with history of COPD with continued smoke cigarettes was admitted 2 days ago for respiratory failure and now is on ventilatory support. Today he was noted to have coffee-ground return. Patient has been on aspirin at home. Lovenox has been discontinued. Suspect a shunt has stress induced peptic disease and GI bleed however he could also have chronic peptic ulcer disease given COPD. Patient has mild anemia with stable H&H. Patient is now on IV pantoprazole.  Patient's condition reviewed with his wife Ms. Suezanne Cheshire over the phone and need for diagnostic and possibly therapeutic EGD. She is agreeable.  Recommendations;  Diagnostic and possibly therapeutic EGD later today at bedside.   LOS: 2 days   Laquashia Mergenthaler U  07/04/2015, 2:57 PM

## 2015-06-24 NOTE — Progress Notes (Signed)
GI consult called to Dr. Darrick PennaFields [phone].

## 2015-06-24 NOTE — Progress Notes (Signed)
   EGD performed at bedside in ICU.  Findings:  Small prepyloric ulcer with stigmata of bleeding(oozing). Single 360 applied with hemostasis. Mallory-Weiss tear without stigmata of GI bleed. Evidence of NG tube trauma at gastric body.  Recommendations:  Continue IV PPI for 72 hours. H. pylori serology. Begin nasogastric feeding as ordered by Dr. Juanetta GoslingHawkins. No anticoagulation or antiplatelet agents for 1 week.   Dr.Fields will be seeing patient over the weekend.

## 2015-06-24 NOTE — Progress Notes (Signed)
Nutrition Note (Consult)  INTERVENTION:  When pt is cleared medically: Initiate Vital HP @ 40 ml/hr via OGT. Tube feeding will provide 960 kcal, 84 grams of protein, and 802 ml of H2O which will meet 100% of energy (with propofol included) and 88% of protein needs.  NUTRITION DIAGNOSIS:   Inadequate oral intake related to inability to eat as evidenced by NPO status  .  GOAL:   Provide needs based on ASPEN/SCCM guidelines  MONITOR:   Vent status, Labs, Weight trends  REASON FOR ASSESSMENT:   Ventilator/ Consult to adjust tube feeding rate and formula as indicated     ASSESSMENT:   70 y.o. male with hx of severe COPD, refusal to wear Bipap at home, continued smoker, hx of HTN, anxiety, vascular dementia, presented to the ER with one week hx of progressive SOB and green sputum productive coughs and having confusion.   Patient remains intubated on ventilator support. GI consulted today and has recommended hold tube feeding for now per [place]. Recommendations for feeding outlined above. His sedative has decreased slightly since yesterday which affected his lipid intake about 100 kcal.  His weight has increased 5# (3.5%) overnight.  MV: 7.1 L/min Temp (24hrs), Avg:97.7 F (36.5 C), Min:96.6 F (35.9 C), Max:98.9 F (37.2 C)  Propofol: 19.1 ml/hr (504 kcal lipids q 24 hr current rate)  Diet Order:  Diet NPO time specified  Skin:   pink, intact  Last BM:   PTA  Height:   Ht Readings from Last 1 Encounters:  07/02/2015 6\' 1"  (1.854 m)    Weight:   Wt Readings from Last 1 Encounters:  06/23/2015 145 lb 4.5 oz (65.9 kg)    Ideal Body Weight:  84 kg  BMI:  Body mass index is 19.17 kg/(m^2).  Estimated Nutritional Needs:   Kcal:  1580  Protein:  95-110 gr  Fluid:  >1500 ml daily  EDUCATION NEEDS:   No education needs identified at this time  Royann ShiversLynn Noura Purpura MS,RD,CSG,LDN Office: #[phone] Pager: [phone]

## 2015-06-24 NOTE — Op Note (Signed)
[facility] Patient Name: Shane Todd Procedure Date: 07/08/2015 3:42 PM MRN: [id number] Date of Birth: [birth year] Attending MD: Lionel December , MD CSN: [id number] Age: 70 Admit Type: Inpatient Procedure:                Upper GI endoscopy at bedside in ICU. Indications:              Coffee-ground emesis Providers:                Lionel December, MD Referring MD:             Houston Siren, MD Medicines:                Cetacaine spray, Midazolam 2 mg IV Complications:            No immediate complications. Estimated Blood Loss:     Estimated blood loss: none. Procedure:                Pre-Anesthesia Assessment:                           - Prior to the procedure, a History and Physical                            was performed, and patient medications and                            allergies were reviewed. The patient's tolerance of                            previous anesthesia was also reviewed. The risks                            and benefits of the procedure and the sedation                            options and risks were discussed with the patient.                            All questions were answered, and informed consent                            was obtained. Prior Anticoagulants: The patient                            last took Lovenox (enoxaparin) on the day of the                            procedure. ASA Grade Assessment: III - A patient                            with severe systemic disease. After reviewing the                            risks and benefits, the patient was deemed in  satisfactory condition to undergo the procedure.                           After obtaining informed consent, the endoscope was                            passed under direct vision. Throughout the                            procedure, the patient's blood pressure, pulse, and                            oxygen saturations were monitored continuously. The                  [id number] ([id number]) scope was introduced through the                            mouth, and advanced to the second part of duodenum.                            The upper GI endoscopy was accomplished without                            difficulty. The patient tolerated the procedure                            well. Scope In: 5:45:41 PM Scope Out: 5:55:54 PM Total Procedure Duration: 0 hours 10 minutes 13 seconds  Findings:      The examined esophagus was normal.      The Z-line was irregular and was found 40 cm from the incisors.      One oozing cratered gastric ulcer was found in the prepyloric region of       the stomach. The lesion was 4 mm in largest dimension. To stop active       bleeding, one hemostatic clip was successfully placed (MR conditional).       There was no bleeding at the end of the procedure.      A 5 to 10 mm non-bleeding Mallory-Weiss tear with no stigmata of recent       bleeding was found.      Nasogastric tube trauma characterized by erythema was evident in the       gastric body.      The duodenal bulb and second portion of the duodenum were normal. Impression:               - Normal esophagus.                           - Z-line irregular, 40 cm from the incisors.                           - Oozing gastric ulcer. Clip (MR conditional) was                            placed.                           -  Mallory-Weiss tear without stigmata of bleed.                           - Nasogastric tube trauma present in stomach.                           - Normal duodenal bulb and second portion of the                            duodenum.                           - No specimens collected. Moderate Sedation:      Moderate (conscious) sedation was administered by the endoscopy nurse       and supervised by the endoscopist. The following parameters were       monitored: oxygen saturation, heart rate, blood pressure, CO2       capnography and response to care.  Total physician intraservice time was       12 minutes. Recommendation:           - reinsert nasogastric feeding tube.                           - Begin nasogastric feeding today.                           - Perform an H. pylori serology tomorrow.                           - Continue present medications.                           - No aspirin, ibuprofen, naproxen, or other                            non-steroidal anti-inflammatory drugs for 7 days. Procedure Code(s):        --- Professional ---                           [phone], Esophagogastroduodenoscopy, flexible,                            transoral; with control of bleeding, any method                           99152, Moderate sedation services provided by the                            same physician or other qualified health care                            professional performing the diagnostic or                            therapeutic service that the sedation supports,  requiring the presence of an independent trained                            observer to assist in the monitoring of the                            patient's level of consciousness and physiological                            status; initial 15 minutes of intraservice time,                            patient age 67 years or older Diagnosis Code(s):        --- Professional ---                           K22.8, Other specified diseases of esophagus                           K25.4, Chronic or unspecified gastric ulcer with                            hemorrhage                           K22.6, Gastro-esophageal laceration-hemorrhage                            syndrome                           K91.89, Other postprocedural complications and                            disorders of digestive system                           K92.0, Hematemesis CPT copyright 2016 [employer]. All rights reserved. The codes documented in this report  are preliminary and upon coder review may  be revised to meet current compliance requirements. [name], MD [name], MD 06/20/2015 6:20:04 PM This report has been signed electronically. Number of Addenda: 0

## 2015-06-24 NOTE — Progress Notes (Signed)
Subjective: He remains intubated and on the ventilator. His endotracheal tube was repositioned last night. Chest x-ray and blood gas are pending. He is still sedated.  Objective: Vital signs in last 24 hours: Temp:  [96.6 F (35.9 C)-97.9 F (36.6 C)] 96.9 F (36.1 C) (05/12 0400) Pulse Rate:  [44-100] 75 (05/12 0630) Resp:  [0-26] 20 (05/12 0630) BP: (72-166)/(51-86) 160/78 mmHg (05/12 0630) SpO2:  [95 %-100 %] 100 % (05/12 0630) FiO2 (%):  [40 %] 40 % (05/12 0325) Weight:  [65.9 kg (145 lb 4.5 oz)] 65.9 kg (145 lb 4.5 oz) (05/12 0500) Weight change: 2.396 kg (5 lb 4.5 oz)    Intake/Output from previous day: 05/11 0701 - 05/12 0700 In: 3466.8 [I.V.:3216.8; IV Piggyback:250] Out: 550 [Urine:550]  PHYSICAL EXAM General appearance: Intubated sedated on mechanical ventilation Resp: rhonchi bilaterally and Diminished breath sounds Cardio: regular rate and rhythm, S1, S2 normal, no murmur, click, rub or gallop GI: soft, non-tender; bowel sounds normal; no masses,  no organomegaly Extremities: extremities normal, atraumatic, no cyanosis or edema  Lab Results:  Results for orders placed or performed during the hospital encounter of 07/10/2015 (from the past 48 hour(s))  Blood gas, arterial ([facility] & [facility] ONLY)     Status: Abnormal   Collection Time: 07/08/2015  7:50 AM  Result Value Ref Range   O2 Content 5.0 L/min   Delivery systems NASAL CANNULA    pH, Arterial 7.189 (LL) 7.350 - 7.450    Comment: CRITICAL RESULT CALLED TO, READ BACK BY AND VERIFIED WITH: BAHUR,L.RN 06/17/2015 AT 0800 BY BROADNAX,L.RRT    pCO2 arterial 94.5 (HH) 35.0 - 45.0 mmHg    Comment: CRITICAL RESULT CALLED TO, READ BACK BY AND VERIFIED WITH: BAHUR,L.RN 06/16/2015 AT 0800 BY BROADNAX,L.RRT    pO2, Arterial 160.00 (H) 80.0 - 100.0 mmHg   Bicarbonate 27.9 (H) 20.0 - 24.0 mEq/L   Acid-Base Excess 6.7 (H) 0.0 - 2.0 mmol/L   O2 Saturation 98.7 %   Collection site RIGHT RADIAL    Drawn by [id number]    Sample type ARTERIAL     Allens test (pass/fail) PASS PASS  Blood gas, arterial ([facility] ONLY)     Status: Abnormal   Collection Time: 06/21/2015  9:55 AM  Result Value Ref Range   FIO2 40.00    Delivery systems VENTILATOR    Mode BILEVEL POSITIVE AIRWAY PRESSURE    Peep/cpap 8.0 cm H20   Pressure support 20 cm H20   pH, Arterial 7.215 (L) 7.350 - 7.450   pCO2 arterial 86.1 (HH) 35.0 - 45.0 mmHg    Comment: CRITICAL RESULT CALLED TO, READ BACK BY AND VERIFIED WITH: BAHOUR,L.RN 07/03/2015 AT 1005 BY BROADNAX,L.RRT    pO2, Arterial 136.00 (H) 80.0 - 100.0 mmHg   Bicarbonate 27.8 (H) 20.0 - 24.0 mEq/L   Acid-Base Excess 6.1 (H) 0.0 - 2.0 mmol/L   O2 Saturation 98.3 %   Collection site LEFT BRACHIAL    Drawn by [id number]    Sample type ARTERIAL   Urinalysis, Routine w reflex microscopic     Status: Abnormal   Collection Time: 07/03/2015 11:25 AM  Result Value Ref Range   Color, Urine YELLOW YELLOW   APPearance CLEAR CLEAR   Specific Gravity, Urine >1.030 (H) 1.005 - 1.030   pH 6.0 5.0 - 8.0   Glucose, UA NEGATIVE NEGATIVE mg/dL   Hgb urine dipstick NEGATIVE NEGATIVE   Bilirubin Urine NEGATIVE NEGATIVE   Ketones, ur NEGATIVE NEGATIVE mg/dL   Protein, ur  30 (A) NEGATIVE mg/dL   Nitrite NEGATIVE NEGATIVE   Leukocytes, UA NEGATIVE NEGATIVE  Urine microscopic-add on     Status: Abnormal   Collection Time: 06/21/2015 11:25 AM  Result Value Ref Range   Squamous Epithelial / LPF NONE SEEN NONE SEEN   WBC, UA NONE SEEN 0 - 5 WBC/hpf   RBC / HPF NONE SEEN 0 - 5 RBC/hpf   Bacteria, UA RARE (A) NONE SEEN  Blood gas, arterial     Status: Abnormal   Collection Time: 06/28/2015  1:45 PM  Result Value Ref Range   FIO2 100.00    Delivery systems VENTILATOR    Mode PRESSURE REGULATED VOLUME CONTROL    VT 600 mL   LHR 16 resp/min   Peep/cpap 5.0 cm H20   pH, Arterial 7.519 (H) 7.350 - 7.450   pCO2 arterial 35.3 35.0 - 45.0 mmHg   pO2, Arterial 368.00 (H) 80.0 - 100.0 mmHg   Bicarbonate 29.8 (H) 20.0 - 24.0 mEq/L    Acid-Base Excess 5.5 (H) 0.0 - 2.0 mmol/L   O2 Saturation 100.0 %   Collection site LEFT BRACHIAL    Drawn by [id number]    Sample type ARTERIAL   MRSA PCR Screening     Status: None   Collection Time: 07/03/2015  2:10 PM  Result Value Ref Range   MRSA by PCR NEGATIVE NEGATIVE    Comment:        The GeneXpert MRSA Assay (FDA approved for NASAL specimens only), is one component of a comprehensive MRSA colonization surveillance program. It is not intended to diagnose MRSA infection nor to guide or monitor treatment for MRSA infections.   Blood gas, arterial     Status: Abnormal   Collection Time: 06/26/2015  6:47 PM  Result Value Ref Range   FIO2 40.00    Delivery systems VENTILATOR    Mode PRESSURE REGULATED VOLUME CONTROL    VT 600 mL   LHR 16.0 resp/min   Peep/cpap 5.0 cm H20   pH, Arterial 7.506 (H) 7.350 - 7.450   pCO2 arterial 38.5 35.0 - 45.0 mmHg   pO2, Arterial 158.0 (H) 80.0 - 100.0 mmHg   Bicarbonate 30.8 (H) 20.0 - 24.0 mEq/L   Acid-Base Excess 6.8 (H) 0.0 - 2.0 mmol/L   O2 Saturation 99.4 %   Collection site RIGHT RADIAL    Drawn by COLLECTED BY RT    Sample type ARTERIAL    Allens test (pass/fail) PASS PASS  Basic metabolic panel     Status: Abnormal   Collection Time: 06/23/15  4:30 AM  Result Value Ref Range   Sodium 142 135 - 145 mmol/L   Potassium 3.3 (L) 3.5 - 5.1 mmol/L    Comment: DELTA CHECK NOTED   Chloride 106 101 - 111 mmol/L   CO2 30 22 - 32 mmol/L   Glucose, Bld 216 (H) 65 - 99 mg/dL   BUN 44 (H) 6 - 20 mg/dL   Creatinine, Ser 0.79 0.61 - 1.24 mg/dL   Calcium 8.4 (L) 8.9 - 10.3 mg/dL   GFR calc non Af Amer >60 >60 mL/min   GFR calc Af Amer >60 >60 mL/min    Comment: (NOTE) The eGFR has been calculated using the CKD EPI equation. This calculation has not been validated in all clinical situations. eGFR's persistently <60 mL/min signify possible Chronic Kidney Disease.    Anion gap 6 5 - 15  CBC     Status: Abnormal   Collection Time:  06/23/15  4:30 AM  Result Value Ref Range   WBC 5.1 4.0 - 10.5 K/uL   RBC 3.27 (L) 4.22 - 5.81 MIL/uL   Hemoglobin 10.1 (L) 13.0 - 17.0 g/dL   HCT 31.1 (L) 39.0 - 52.0 %   MCV 95.1 78.0 - 100.0 fL   MCH 30.9 26.0 - 34.0 pg   MCHC 32.5 30.0 - 36.0 g/dL   RDW 14.1 11.5 - 15.5 %   Platelets 195 150 - 400 K/uL  Triglycerides     Status: None   Collection Time: 06/23/15  4:30 AM  Result Value Ref Range   Triglycerides 98 <150 mg/dL  Blood gas, arterial     Status: Abnormal   Collection Time: 06/23/15 12:25 PM  Result Value Ref Range   FIO2 0.40    Delivery systems VENTILATOR    Mode PRESSURE REGULATED VOLUME CONTROL    VT 600 mL   LHR 14.0 resp/min   Peep/cpap 5.0 cm H20   pH, Arterial 7.513 (H) 7.350 - 7.450   pCO2 arterial 34.0 (L) 35.0 - 45.0 mmHg   pO2, Arterial 180 (H) 80.0 - 100.0 mmHg   Bicarbonate 28.4 (H) 20.0 - 24.0 mEq/L   Acid-Base Excess 4.0 (H) 0.0 - 2.0 mmol/L   O2 Saturation 99.4 %   Patient temperature 37.0    Collection site LEFT BRACHIAL    Drawn by [id number]    Sample type ARTERIAL DRAW    Allens test (pass/fail) PASS PASS  Blood gas, arterial     Status: Abnormal   Collection Time: 06/23/15  5:25 PM  Result Value Ref Range   FIO2 0.40    Delivery systems VENTILATOR    Mode PRESSURE REGULATED VOLUME CONTROL    VT 550 mL   LHR 12 resp/min   Peep/cpap 5.0 cm H20   pH, Arterial 7.313 (L) 7.350 - 7.450   pCO2 arterial 58.8 (HH) 35.0 - 45.0 mmHg    Comment: CRITICAL RESULT CALLED TO, READ BACK BY AND VERIFIED WITH: M.GRAY RN,AT 1743 BY WENDY VIA,RRT,RCP ON 06/23/15    pO2, Arterial 130 (H) 80.0 - 100.0 mmHg   Bicarbonate 26.2 (H) 20.0 - 24.0 mEq/L   Acid-Base Excess 3.2 (H) 0.0 - 2.0 mmol/L   O2 Saturation 98.2 %   Patient temperature 37.0    Collection site LEFT BRACHIAL    Drawn by [id number]    Sample type ARTERIAL DRAW    Allens test (pass/fail) PASS PASS  Blood gas, arterial     Status: Abnormal   Collection Time: 06/23/15  7:35 PM  Result Value Ref  Range   FIO2 40.00    Delivery systems VENTILATOR    Mode PRESSURE REGULATED VOLUME CONTROL    VT 550 mL   LHR 12 resp/min   Peep/cpap 5.0 cm H20   pH, Arterial 7.374 7.350 - 7.450   pCO2 arterial 50.4 (H) 35.0 - 45.0 mmHg   pO2, Arterial 163 (H) 80.0 - 100.0 mmHg   Bicarbonate 27.5 (H) 20.0 - 24.0 mEq/L   TCO2 13.3 0 - 100 mmol/L   Acid-Base Excess 3.9 (H) 0.0 - 2.0 mmol/L   O2 Saturation 99.3 %   Patient temperature 37.0    Collection site RIGHT RADIAL    Drawn by [id number]    Sample type ARTERIAL    Allens test (pass/fail) PASS PASS    ABGS  Recent Labs  06/23/15 1935  PHART 7.374  PO2ART 163*  TCO2 13.3  HCO3 27.5*   CULTURES  Recent Results (from the past 240 hour(s))  MRSA PCR Screening     Status: None   Collection Time: 07/12/2015  2:10 PM  Result Value Ref Range Status   MRSA by PCR NEGATIVE NEGATIVE Final    Comment:        The GeneXpert MRSA Assay (FDA approved for NASAL specimens only), is one component of a comprehensive MRSA colonization surveillance program. It is not intended to diagnose MRSA infection nor to guide or monitor treatment for MRSA infections.    Studies/Results: Dg Chest Portable 1 View  07/07/2015  CLINICAL DATA:  70 year old male with cough and shortness of Breath for 1 week, increasing for 2 days. Smoker, COPD. Initial encounter. EXAM: PORTABLE CHEST 1 VIEW COMPARISON:  04/16/2015 and earlier. FINDINGS: Portable AP upright view at 0730 hours. Chronic large lung volumes. Stable cardiac size and mediastinal contours. No pneumothorax or pleural effusion. Continued peripheral and interstitial/nodular pulmonary opacity in both lungs which has not significantly changed compared to 04/11/2015 - but has significantly progressed since 2013. No acute pulmonary opacity identified. Chronic right posterior fifth rib fracture suspected. IMPRESSION: Chronic lung disease which has progressed since 2013, but no superimposed acute findings are identified.  Electronically Signed   By: Genevie Ann M.D.   On: 07/05/2015 07:43   Dg Chest Port 1v Same Day  06/25/2015  CLINICAL DATA:  Endotracheal tube readjustment. EXAM: PORTABLE CHEST 1 VIEW COMPARISON:  Same day. FINDINGS: The heart size and mediastinal contours are within normal limits. Nasogastric tube is seen entering stomach. Distal tip of endotracheal tube is approximately 6.5 cm above the carina. No pneumothorax is noted. Hyperexpansion of the lungs is noted. Stable biapical scarring is noted. No significant pulmonary changed compared to prior exam. The visualized skeletal structures are unremarkable. IMPRESSION: Endotracheal tube tip is 6.5 cm above the carina. Nasogastric tube is seen entering stomach, although exact position of tip cannot be determined. Electronically Signed   By: Marijo Conception, M.D.   On: 06/23/2015 13:40   Dg Chest Port 1v Same Day  07/13/2015  CLINICAL DATA:  Post intubation EXAM: PORTABLE CHEST 1 VIEW COMPARISON:  Portable exam 1218 hours compared to 07/06/2015 FINDINGS: Tip of endotracheal tube projects approximately 8.9 cm above carina, located at thoracic inlet; this can be advanced 4 cm if desired for more stable positioning within the trachea. Tip of nasogastric tube projects over stomach though the proximal side-port projects over the distal esophagus, recommend advancing tube 7-8 cm. Normal heart size, mediastinal contours, and pulmonary vascularity. Atherosclerotic calcification aorta. Emphysematous and bronchitic changes consistent with COPD. Biapical scarring. Mild atelectasis LEFT base. No definite infiltrate, pleural effusion or pneumothorax. IMPRESSION: May consider advancing endotracheal tube 4 cm for more stable positioning above the carina as above. Recommend advancing nasogastric tube 7 cm. COPD changes with atelectasis LEFT base. Findings called to Dr. Reather Converse On 06/27/2015 at 1253 hours. Electronically Signed   By: Lavonia Dana M.D.   On: 06/18/2015 12:53     Medications:  Prior to Admission:  Prescriptions prior to admission  Medication Sig Dispense Refill Last Dose  . albuterol (PROVENTIL HFA;VENTOLIN HFA) 108 (90 BASE) MCG/ACT inhaler Inhale 2 puffs into the lungs every 4 (four) hours as needed for wheezing or shortness of breath. For shortness of breath   07/13/2015 at Unknown time  . amLODipine (NORVASC) 5 MG tablet Take 1 tablet by mouth daily.   06/21/2015 at Unknown time  . DULERA 100-5 MCG/ACT AERO Inhale 2 puffs into the lungs  2 (two) times daily.   07/05/2015 at Unknown time  . lisinopril (PRINIVIL,ZESTRIL) 20 MG tablet Take 20 mg by mouth every morning.    06/21/2015 at Unknown time  . traZODone (DESYREL) 100 MG tablet Take 0.5-1 tablets (50-100 mg total) by mouth at bedtime. 30 tablet 1 06/21/2015 at Unknown time  . feeding supplement, ENSURE ENLIVE, (ENSURE ENLIVE) LIQD Take 237 mLs by mouth 2 (two) times daily between meals. (Patient not taking: Reported on 06/28/2015)     . ipratropium-albuterol (DUONEB) 0.5-2.5 (3) MG/3ML SOLN Take 3 mLs by nebulization 2 (two) times daily. (Patient not taking: Reported on 07/08/2015)     . nicotine (NICODERM CQ - DOSED IN MG/24 HOURS) 21 mg/24hr patch Place 1 patch (21 mg total) onto the skin daily. (Patient not taking: Reported on 07/11/2015)     . predniSONE (DELTASONE) 10 MG tablet Take with breakfast daily for 2 more days. (Patient not taking: Reported on 07/03/2015)     . risperiDONE (RISPERDAL) 0.25 MG tablet Take 1 tablet (0.25 mg total) by mouth at bedtime. (Patient not taking: Reported on 06/27/2015) 30 tablet 1   . senna-docusate (SENOKOT-S) 8.6-50 MG tablet Take 1 tablet by mouth 2 (two) times daily. (Patient not taking: Reported on 07/06/2015)      Scheduled: . albuterol  2.5 mg Nebulization Q6H  . antiseptic oral rinse  7 mL Mouth Rinse QID  . chlorhexidine gluconate (SAGE KIT)  15 mL Mouth Rinse BID  . enoxaparin (LOVENOX) injection  40 mg Subcutaneous Q24H  . famotidine (PEPCID) IV  20 mg  Intravenous Q12H  . levofloxacin (LEVAQUIN) IV  750 mg Intravenous Q24H  . methylPREDNISolone (SOLU-MEDROL) injection  60 mg Intravenous Q6H  . sodium chloride flush  3 mL Intravenous Q12H   Continuous: . dextrose 5 % and 0.9% NaCl 100 mL/hr at 06/25/2015 0627  . propofol (DIPRIVAN) infusion 40 mcg/kg/min (07/05/2015 0733)   ZOX:WRUEAVWUJ, fentaNYL (SUBLIMAZE) injection, ondansetron **OR** ondansetron (ZOFRAN) IV  Assesment: He was admitted with acute on chronic hypoxic/hypercapnic respiratory failure. He has required intubation and mechanical ventilation. At baseline he has severe COPD and he is continuing to smoke. At his last admission he was sent home with plans for him to be on BiPAP but he has not been using that at home.  He has severe protein calorie malnutrition and if he doesn't come off the ventilator were going to need to start nutritional support  He has metabolic encephalopathy in addition to his baseline dementia and agitation Principal Problem:   Acute respiratory failure with hypercapnia ([place]) Active Problems:   COPD with exacerbation (HCC)   Tobacco abuse   Essential hypertension   Protein calorie malnutrition (HCC)   Encephalopathy, metabolic   Acute on chronic respiratory failure ([place])    Plan: See if we have any opportunity to get him off the ventilator. Otherwise continue treatments. Start tube feedings if he doesn't come off today    LOS: 2 days   Tyjai Charbonnet L 06/17/2015, 7:35 AM

## 2015-06-24 NOTE — Care Management Important Message (Signed)
Important Message  Patient Details  Name: Shane Todd MRN: [id number] Date of Birth: July 30, 1945   Medicare Important Message Given:  Yes    Malcolm MetroChildress, Layci Stenglein Demske, RN 2015-12-30, 3:08 PM

## 2015-06-24 NOTE — Care Management Important Message (Signed)
Important Message  Patient Details  Name: Shane Todd MRN: [id number] Date of Birth: 1945/06/05   Medicare Important Message Given:  Yes    Malcolm MetroChildress, Sarahlynn Cisnero Demske, RN 06/15/2015, 3:09 PM

## 2015-06-24 NOTE — Progress Notes (Signed)
[facility] PROGRESS NOTE  Shane Todd YNW:[id number] DOB: [birth year]    PCP:   [name], MD   HPI:  Shane Todd is an 70 y.o. male with hx of severe COPD, continued smoker, dementia, refusal to wear Bipap at home, admitted with respiratory failure, hypoxic and hypercapnic and was intubated. His baseline pCO2 is likely chronically elevated, after ventilatory adjustment, his ABG is more stabalized with pH 7.31 yesterday.  He had blood in this OG tube.  No other events. He was seen by Dr Juanetta Gosling this am as well.   Rewiew of Systems:  Unable.   Past Medical History  Diagnosis Date  . COPD (chronic obstructive pulmonary disease) (HCC)   . Hypertension   . Tobacco abuse   . Anxiety   . Anemia 04/24/2011  . Pneumonia     hospitalized 04/2011  . Shortness of breath     with exertion  . GERD (gastroesophageal reflux disease)   . Constipation   . Eczema   . Kidney stones   . Dementia with behavioral disturbance 04/18/2015  . Acute respiratory failure with hypoxia and hypercarbia (HCC) 07/02/2013  . COPD exacerbation (HCC) 04/05/2015    Past Surgical History  Procedure Laterality Date  . Hemorrhoid surgery    . Colonoscopy    . Inguinal hernia repair Left 06/06/2012    Procedure: HERNIA REPAIR INGUINAL ADULT;  Surgeon: Shelly Rubenstein, MD;  Location: [facility] OR;  Service: General;  Laterality: Left;  . Insertion of mesh Left 06/06/2012    Procedure: INSERTION OF MESH;  Surgeon: Shelly Rubenstein, MD;  Location: [facility] OR;  Service: General;  Laterality: Left;  . Hernia repair Left 06/06/2012    Medications:  HOME MEDS: Prior to Admission medications   Medication Sig Start Date End Date Taking? Authorizing Provider  albuterol (PROVENTIL HFA;VENTOLIN HFA) 108 (90 BASE) MCG/ACT inhaler Inhale 2 puffs into the lungs every 4 (four) hours as needed for wheezing or shortness of breath. For shortness of breath   Yes Historical Provider, MD  amLODipine (NORVASC) 5 MG  tablet Take 1 tablet by mouth daily. 03/22/15  Yes Historical Provider, MD  DULERA 100-5 MCG/ACT AERO Inhale 2 puffs into the lungs 2 (two) times daily. 05/10/15  Yes Historical Provider, MD  lisinopril (PRINIVIL,ZESTRIL) 20 MG tablet Take 20 mg by mouth every morning.    Yes Historical Provider, MD  traZODone (DESYREL) 100 MG tablet Take 0.5-1 tablets (50-100 mg total) by mouth at bedtime. 04/18/15  Yes Elliot Cousin, MD  feeding supplement, ENSURE ENLIVE, (ENSURE ENLIVE) LIQD Take 237 mLs by mouth 2 (two) times daily between meals. Patient not taking: Reported on 07/06/2015 04/18/15   Elliot Cousin, MD  ipratropium-albuterol (DUONEB) 0.5-2.5 (3) MG/3ML SOLN Take 3 mLs by nebulization 2 (two) times daily. Patient not taking: Reported on 06/26/2015 04/18/15   Elliot Cousin, MD  nicotine (NICODERM CQ - DOSED IN MG/24 HOURS) 21 mg/24hr patch Place 1 patch (21 mg total) onto the skin daily. Patient not taking: Reported on 07/12/2015 04/18/15   Elliot Cousin, MD  predniSONE (DELTASONE) 10 MG tablet Take with breakfast daily for 2 more days. Patient not taking: Reported on 06/26/2015 04/18/15   Elliot Cousin, MD  risperiDONE (RISPERDAL) 0.25 MG tablet Take 1 tablet (0.25 mg total) by mouth at bedtime. Patient not taking: Reported on 07/06/2015 04/18/15   Elliot Cousin, MD  senna-docusate (SENOKOT-S) 8.6-50 MG tablet Take 1 tablet by mouth 2 (two) times daily. Patient not taking: Reported on  06/27/2015 04/18/15   Elliot Cousin, MD     Allergies:  Allergies  Allergen Reactions  . Oxycodone Nausea And Vomiting    Social History:   reports that he has been smoking.  He does not have any smokeless tobacco history on file. He reports that he does not drink alcohol or use illicit drugs.  Family History: Family History  Problem Relation Age of Onset  . Stroke Mother   . Deep vein thrombosis Father   . Lung cancer Sister   . Stroke Sister   . Hypertension Sister   . Heart disease Brother   . Stroke Brother   .  Stroke Sister   . Heart disease Sister      Physical Exam: Filed Vitals:   Jun 30, 2015 0545 2015-06-30 0600 06-30-15 0630 06/30/2015 0750  BP:  155/75 160/78   Pulse: 74 77 75   Temp:      TempSrc:      Resp: 18 26 20    Height:      Weight:      SpO2: 100% 100% 100% 100%   Blood pressure 160/78, pulse 75, temperature 96.9 F (36.1 C), temperature source Axillary, resp. rate 20, height 6\' 1"  (1.854 m), weight 65.9 kg (145 lb 4.5 oz), SpO2 100 %.  PSYCH:  Unable to eval HEENT: Mucous membranes pink and anicteric; PERRLA; EOM intact; no cervical lymphadenopathy nor thyromegaly or carotid bruit; no JVD; There were no stridor. Neck is very supple. Breasts:: Not examined CHEST WALL: No tenderness CHEST: mechanically ventilated.  HEART: Regular rate and rhythm.  There are no murmur, rub, or gallops.   BACK: No kyphosis or scoliosis; no CVA tenderness ABDOMEN: soft and non-tender; no masses, no organomegaly, normal abdominal bowel sounds; no pannus; no intertriginous candida. There is no rebound and no distention. Rectal Exam: Not done EXTREMITIES: No bone or joint deformity; age-appropriate arthropathy of the hands and knees; no edema; no ulcerations.  There is no calf tenderness. Genitalia: not examined PULSES: 2+ and symmetric SKIN: Normal hydration no rash or ulceration CNS: sedated.   Labs on Admission:  Basic Metabolic Panel:  Recent Labs Lab 06/18/2015 0726 06/23/15 0430  NA 142 142  K 4.2 3.3*  CL 96* 106  CO2 34* 30  GLUCOSE 149* 216*  BUN 48* 44*  CREATININE 0.84 0.79  CALCIUM 9.4 8.4*   CBC:  Recent Labs Lab 06/27/2015 0726 06/23/15 0430  WBC 8.2 5.1  NEUTROABS 6.4  --   HGB 12.4* 10.1*  HCT 39.9 31.1*  MCV 97.8 95.1  PLT 220 195   Cardiac Enzymes:  Recent Labs Lab 07/04/2015 0726  TROPONINI <0.03   Radiological Exams on Admission: Dg Chest Port 1v Same Day  06/27/2015  CLINICAL DATA:  Endotracheal tube readjustment. EXAM: PORTABLE CHEST 1 VIEW  COMPARISON:  Same day. FINDINGS: The heart size and mediastinal contours are within normal limits. Nasogastric tube is seen entering stomach. Distal tip of endotracheal tube is approximately 6.5 cm above the carina. No pneumothorax is noted. Hyperexpansion of the lungs is noted. Stable biapical scarring is noted. No significant pulmonary changed compared to prior exam. The visualized skeletal structures are unremarkable. IMPRESSION: Endotracheal tube tip is 6.5 cm above the carina. Nasogastric tube is seen entering stomach, although exact position of tip cannot be determined. Electronically Signed   By: Lupita Raider, M.D.   On: 06/20/2015 13:40   Dg Chest Port 1v Same Day  07/07/2015  CLINICAL DATA:  Post intubation EXAM: PORTABLE CHEST  1 VIEW COMPARISON:  Portable exam 1218 hours compared to 06/28/2015 FINDINGS: Tip of endotracheal tube projects approximately 8.9 cm above carina, located at thoracic inlet; this can be advanced 4 cm if desired for more stable positioning within the trachea. Tip of nasogastric tube projects over stomach though the proximal side-port projects over the distal esophagus, recommend advancing tube 7-8 cm. Normal heart size, mediastinal contours, and pulmonary vascularity. Atherosclerotic calcification aorta. Emphysematous and bronchitic changes consistent with COPD. Biapical scarring. Mild atelectasis LEFT base. No definite infiltrate, pleural effusion or pneumothorax. IMPRESSION: May consider advancing endotracheal tube 4 cm for more stable positioning above the carina as above. Recommend advancing nasogastric tube 7 cm. COPD changes with atelectasis LEFT base. Findings called to Dr. Jodi MourningZavitz On 07/04/2015 at 1253 hours. Electronically Signed   By: Ulyses SouthwardMark  Boles M.D.   On: 06/30/2015 12:53    Assessment/Plan Present on Admission:  . Acute respiratory failure with hypercapnia (HCC) . COPD with exacerbation (HCC) . Encephalopathy, metabolic . Protein calorie malnutrition (HCC) .  Tobacco abuse . Essential hypertension . Acute on chronic respiratory failure (HCC)  PLAN:  Acute on chronic respiratory failure:  Will wean vent daily, but unlikely that he will be able to be extubated today.  Will continue with ventilatory support.  UGIB:   Will check serial H and H's, start PPI Bolus and drip.  Will consult GI and I think its OK to start TF.  Abdominal US is pending for today.  HTN:  BP is controlled.  Tobacco use:  Deferred at this time.  Other plans as per orders. Code Status: FULL Unk LightningODE.    Long Brimage, MD.  FACP [facility] Pager [phone] 7pm to 7am.  2016-01-20, 8:47 AM

## 2015-06-24 NOTE — Progress Notes (Signed)
**Note De-identified  Obfuscation** Sputum collected and sent to lab 

## 2015-06-24 NOTE — Progress Notes (Deleted)
CRITICAL VALUE ALERT  Calcium 6.2 mg/dl  Date of notification:  06/25/2015  MD notified (1st page):  Dr. Janna Archondiego  Time of first page:  Phone call  Responding MD:  Dr. Janna Archondiego  Time MD responded:  [id number]  No new orders received at this time.

## 2015-06-25 ENCOUNTER — Inpatient Hospital Stay (HOSPITAL_COMMUNITY): Payer: Medicare Other

## 2015-06-25 LAB — BLOOD GAS, ARTERIAL
Acid-Base Excess: 4.1 mmol/L — ABNORMAL HIGH (ref 0.0–2.0)
Bicarbonate: 27.4 mEq/L — ABNORMAL HIGH (ref 20.0–24.0)
DRAWN BY: 105551
FIO2: 0.35
MECHVT: 550 mL
O2 SAT: 97.6 %
PCO2 ART: 55.8 mmHg — AB (ref 35.0–45.0)
PEEP: 3 cmH2O
PO2 ART: 104 mmHg — AB (ref 80.0–100.0)
RATE: 12 resp/min
pH, Arterial: 7.342 — ABNORMAL LOW (ref 7.350–7.450)

## 2015-06-25 LAB — CBC
HCT: 33.9 % — ABNORMAL LOW (ref 39.0–52.0)
HEMOGLOBIN: 10.6 g/dL — AB (ref 13.0–17.0)
MCH: 30.6 pg (ref 26.0–34.0)
MCHC: 31.3 g/dL (ref 30.0–36.0)
MCV: 98 fL (ref 78.0–100.0)
Platelets: 208 10*3/uL (ref 150–400)
RBC: 3.46 MIL/uL — ABNORMAL LOW (ref 4.22–5.81)
RDW: 15.4 % (ref 11.5–15.5)
WBC: 9.9 10*3/uL (ref 4.0–10.5)

## 2015-06-25 LAB — GLUCOSE, CAPILLARY
GLUCOSE-CAPILLARY: 294 mg/dL — AB (ref 65–99)
Glucose-Capillary: 169 mg/dL — ABNORMAL HIGH (ref 65–99)
Glucose-Capillary: 179 mg/dL — ABNORMAL HIGH (ref 65–99)
Glucose-Capillary: 190 mg/dL — ABNORMAL HIGH (ref 65–99)
Glucose-Capillary: 191 mg/dL — ABNORMAL HIGH (ref 65–99)

## 2015-06-25 MED ORDER — IPRATROPIUM-ALBUTEROL 0.5-2.5 (3) MG/3ML IN SOLN
3.0000 mL | Freq: Four times a day (QID) | RESPIRATORY_TRACT | Status: DC
  Administered 2015-06-25 – 2015-06-29 (×18): 3 mL via RESPIRATORY_TRACT
  Filled 2015-06-25 (×19): qty 3

## 2015-06-25 MED ORDER — INSULIN ASPART 100 UNIT/ML ~~LOC~~ SOLN
0.0000 [IU] | SUBCUTANEOUS | Status: DC
  Administered 2015-06-25 (×2): 3 [IU] via SUBCUTANEOUS
  Administered 2015-06-25: 8 [IU] via SUBCUTANEOUS
  Administered 2015-06-25 – 2015-06-26 (×3): 3 [IU] via SUBCUTANEOUS
  Administered 2015-06-26 (×2): 5 [IU] via SUBCUTANEOUS
  Administered 2015-06-26 (×2): 3 [IU] via SUBCUTANEOUS
  Administered 2015-06-27: 2 [IU] via SUBCUTANEOUS
  Administered 2015-06-27: 3 [IU] via SUBCUTANEOUS
  Administered 2015-06-27 (×2): 5 [IU] via SUBCUTANEOUS
  Administered 2015-06-27: 3 [IU] via SUBCUTANEOUS
  Administered 2015-06-28: 2 [IU] via SUBCUTANEOUS
  Administered 2015-06-28: 5 [IU] via SUBCUTANEOUS
  Administered 2015-06-28: 3 [IU] via SUBCUTANEOUS
  Administered 2015-06-28: 5 [IU] via SUBCUTANEOUS

## 2015-06-25 MED ORDER — JEVITY 1.2 CAL PO LIQD
1000.0000 mL | ORAL | Status: DC
  Administered 2015-06-25 – 2015-06-26 (×2): 1000 mL
  Filled 2015-06-25 (×5): qty 1000

## 2015-06-25 NOTE — Progress Notes (Signed)
**Note De-Identified  Obfuscation** Patient placed back on full support mode.  RRT to continue to monitor.

## 2015-06-25 NOTE — Progress Notes (Addendum)
Patient ID: Shane Todd, male   DOB: [birth year], 70 y.o.   MRN: [id number]   Assessment/Plan: ADMITTED WITH COPD EXACERBATION COMPLICATED BY RESPIRATORY FAILURE REQUIRING INTUBATION AND GI BLEED(COFFEE GROUND RETURN IN NG TUBE WHILE ON LOVENOX). EGD MAY 12-PUD, MW TEAR.  PLAN: 1. PROTONIX GTT UNTIL MAY 15  THEN BID PPI FOR 3 MOS 2. SUPPORTIVE CARE   Subjective: Since last evaluated, the patient REMAINS INTUBATED.  Objective: Vital signs in last 24 hours: Filed Vitals:   06/25/15 0700 06/25/15 0800  BP: 169/73 150/68  Pulse: 59 80  Temp:    Resp: 14 20    General appearance: no distress, INTUBATED, SEDATED Resp: COARSE BS BILATERALLY Cardio: regular rate and rhythm GI: soft, non-tender; bowel sounds normal;   Lab Results:  MAR 7  MAY 10 MAY 12 MAY 13 Hb 12.6   12.4  11.4  10.6   Studies/Results: US Abdomen Complete  06/30/2015  CLINICAL DATA:  Abdominal distension, ventilated patient, history of kidney stones. EXAM: ABDOMEN ULTRASOUND COMPLETE COMPARISON:  None in PACs FINDINGS: Gallbladder: The gallbladder is mildly distended and exhibits significant wall thickening to as much as 14 mm and pericholecystic fluid. No sludge or stones are evident. A reliable sonographic Murphy's sign could not be assessed due to the patient's clinical status. Common bile duct: Diameter: 2.4 mm Liver: The liver exhibits normal echotexture. There is no focal mass nor ductal dilation. There is a small amount of ascites in the right upper quadrant. IVC: No abnormality visualized. Pancreas: Visualization of the pancreatic head and body is normal. The pancreatic tail is obscured by bowel gas. Spleen: Size and appearance within normal limits. Right Kidney: Length: 11.2 cm. The renal cortical echotexture appears normal. There is a lower pole cystic structure measuring 2.7 x 1.8 x 1.9 cm. There is no hydronephrosis. There is a trace of perinephric fluid. Left Kidney: Length: 11.5 cm. Ectatic abdominal  aortic wall measuring between 2.6 and 2.7 cm. There is mural calcification Abdominal aorta: No aneurysm visualized. Other findings: None. IMPRESSION: 1. No gallstones are demonstrated. Distention of the gallbladder with marked gallbladder wall thickening and pericholecystic fluid is consistent with acute acalculous cholecystitis. 2. Normal appearance of the liver. Small amount of ascites in the right upper quadrant and adjacent to the right kidney. 3. No hydronephrosis. There is a lower pole cyst in the right kidney. 4. Atherosclerotic changes of the abdominal aorta with maximal diameter of 2.7 mm. 5. These results will be called to the ordering clinician or representative by the Radiologist Assistant, and communication documented in the PACS or zVision Dashboard. Electronically Signed   By: David  [place] M.D.   On: 07/04/2015 13:10   Dg Abd Portable 1v  07/02/2015  CLINICAL DATA:  Encounter for OG tube placement EXAM: PORTABLE ABDOMEN - 1 VIEW COMPARISON:  04/16/2015 FINDINGS: Orogastric tube is in place with tip to level of the proximal stomach. Consider advancing the tube further into the stomach by 10- 12 cm. There is mild dilatation of small bowel loops, primarily within the left upper quadrant. There is no free intraperitoneal air beneath the diaphragm. Bilateral pleural effusions are present. IMPRESSION: 1. Interval placement of orogastric tube. Consider advancing to 11-2010 cm. 2. Mild dilatation of small bowel loops consistent with ileus or early obstruction. 3. Bilateral pleural effusions. Electronically Signed   By: Norva Pavlov M.D.   On: 06/23/2015 19:59    Medications: I have reviewed the patient's current medications.   LOS: 5 days   Romilda Proby  Alveda Vanhorne 07/23/2013, 2:23 PM

## 2015-06-25 NOTE — Progress Notes (Signed)
[facility] PROGRESS NOTE  Shane Todd WUJ:[id number] DOB: 06/26/1945    PCP:   Trinna PostKOBERLEIN, JUNELL CAROL, MD   HPI:  Shane Todd is an 70 y.o. male with hx of severe COPD, continued smoker, dementia, refusal to wear Bipap at home, admitted with respiratory failure, hypoxic and hypercapnic and was intubated. His baseline pCO2 is likely chronically elevated, after ventilatory adjustment, his ABG is more stabalized with pH 7.34 and pCO2 in the 55 range.  He had blood in this OG tube. and EGD by Dr Renae Fickleheman showed Gastric ulcer and non bleeding MW tear.  He was clipped.  He was started on TF. I had updated his family about his condition daily.  His wife is also thinking about palliative care consultation next week as well.   Rewiew of Systems: Unable.    Past Medical History  Diagnosis Date  . COPD (chronic obstructive pulmonary disease) (HCC)   . Hypertension   . Tobacco abuse   . Anxiety   . Anemia 04/24/2011  . Pneumonia     hospitalized 04/2011  . Shortness of breath     with exertion  . GERD (gastroesophageal reflux disease)   . Constipation   . Eczema   . Kidney stones   . Dementia with behavioral disturbance 04/18/2015  . Acute respiratory failure with hypoxia and hypercarbia (HCC) 07/02/2013  . COPD exacerbation (HCC) 04/05/2015    Past Surgical History  Procedure Laterality Date  . Hemorrhoid surgery    . Colonoscopy    . Inguinal hernia repair Left 06/06/2012    Procedure: HERNIA REPAIR INGUINAL ADULT;  Surgeon: Shelly Rubensteinouglas A Blackman, MD;  Location: [facility] OR;  Service: General;  Laterality: Left;  . Insertion of mesh Left 06/06/2012    Procedure: INSERTION OF MESH;  Surgeon: Shelly Rubensteinouglas A Blackman, MD;  Location: [facility] OR;  Service: General;  Laterality: Left;  . Hernia repair Left 06/06/2012    Medications:  HOME MEDS: Prior to Admission medications   Medication Sig Start Date End Date Taking? Authorizing Provider  albuterol (PROVENTIL HFA;VENTOLIN HFA) 108 (90 BASE)  MCG/ACT inhaler Inhale 2 puffs into the lungs every 4 (four) hours as needed for wheezing or shortness of breath. For shortness of breath   Yes Historical Provider, MD  amLODipine (NORVASC) 5 MG tablet Take 1 tablet by mouth daily. 03/22/15  Yes Historical Provider, MD  DULERA 100-5 MCG/ACT AERO Inhale 2 puffs into the lungs 2 (two) times daily. 05/10/15  Yes Historical Provider, MD  lisinopril (PRINIVIL,ZESTRIL) 20 MG tablet Take 20 mg by mouth every morning.    Yes Historical Provider, MD  traZODone (DESYREL) 100 MG tablet Take 0.5-1 tablets (50-100 mg total) by mouth at bedtime. 04/18/15  Yes Elliot Cousinenise Fisher, MD  feeding supplement, ENSURE ENLIVE, (ENSURE ENLIVE) LIQD Take 237 mLs by mouth 2 (two) times daily between meals. Patient not taking: Reported on 06/29/2015 04/18/15   Elliot Cousinenise Fisher, MD  ipratropium-albuterol (DUONEB) 0.5-2.5 (3) MG/3ML SOLN Take 3 mLs by nebulization 2 (two) times daily. Patient not taking: Reported on 07/04/2015 04/18/15   Elliot Cousinenise Fisher, MD  nicotine (NICODERM CQ - DOSED IN MG/24 HOURS) 21 mg/24hr patch Place 1 patch (21 mg total) onto the skin daily. Patient not taking: Reported on 06/13/2015 04/18/15   Elliot Cousinenise Fisher, MD  predniSONE (DELTASONE) 10 MG tablet Take with breakfast daily for 2 more days. Patient not taking: Reported on 07/13/2015 04/18/15   Elliot Cousinenise Fisher, MD  risperiDONE (RISPERDAL) 0.25 MG tablet Take 1 tablet (0.25 mg  total) by mouth at bedtime. Patient not taking: Reported on 2015-06-28 04/18/15   Elliot Cousin, MD  senna-docusate (SENOKOT-S) 8.6-50 MG tablet Take 1 tablet by mouth 2 (two) times daily. Patient not taking: Reported on Jun 28, 2015 04/18/15   Elliot Cousin, MD     Allergies:  Allergies  Allergen Reactions  . Oxycodone Nausea And Vomiting    Social History:   reports that he has been smoking.  He does not have any smokeless tobacco history on file. He reports that he does not drink alcohol or use illicit drugs.  Family History: Family History  Problem  Relation Age of Onset  . Stroke Mother   . Deep vein thrombosis Father   . Lung cancer Sister   . Stroke Sister   . Hypertension Sister   . Heart disease Brother   . Stroke Brother   . Stroke Sister   . Heart disease Sister      Physical Exam: Filed Vitals:   06/25/15 0733 06/25/15 0800 06/25/15 0840 06/25/15 0913  BP:  150/68    Pulse:  80    Temp:      TempSrc:      Resp:  20    Height:      Weight:      SpO2: 98% 98% 99% 99%   Blood pressure 150/68, pulse 80, temperature 97.9 F (36.6 C), temperature source Oral, resp. rate 20, height  (1.88 m), weight 70.1 kg (154 lb 8.7 oz), SpO2 99 %.  GEN:   patient lying in the stretcher in no acute distress; cooperative with exam. PSYCH:  alert and oriented x4; does not appear anxious or depressed; affect is appropriate. HEENT: Mucous membranes pink and anicteric; PERRLA; EOM intact; no cervical lymphadenopathy nor thyromegaly or carotid bruit; no JVD; There were no stridor. Neck is very supple. Breasts:: Not examined CHEST WALL: No tenderness CHEST: mechanically ventilated.  HEART: Regular rate and rhythm.  There are no murmur, rub, or gallops.   BACK: No kyphosis or scoliosis; no CVA tenderness ABDOMEN: soft and non-tender; no masses, no organomegaly, normal abdominal bowel sounds; no pannus; no intertriginous candida. There is no rebound and no distention. Rectal Exam: Not done EXTREMITIES: No bone or joint deformity; age-appropriate arthropathy of the hands and knees; no edema; no ulcerations.  There is no calf tenderness. Genitalia: not examined PULSES: 2+ and symmetric SKIN: Normal hydration no rash or ulceration CNS: Unable.   Labs on Admission:  Basic Metabolic Panel:  Recent Labs Lab Jun 28, 2015 0726 06/23/15 0430  NA 142 142  K 4.2 3.3*  CL 96* 106  CO2 34* 30  GLUCOSE 149* 216*  BUN 48* 44*  CREATININE 0.84 0.79  CALCIUM 9.4 8.4*   CBC:  Recent Labs Lab Jun 28, 2015 0726 06/23/15 0430 07/09/2015 0858  07/13/2015 1412 06/13/2015 2050 06/25/15 0222  WBC 8.2 5.1 10.1 11.5* 9.5 9.9  NEUTROABS 6.4  --   --   --   --   --   HGB 12.4* 10.1* 11.4* 11.6* 10.4* 10.6*  HCT 39.9 31.1* 36.2* 37.2* 33.6* 33.9*  MCV 97.8 95.1 97.6 97.9 98.0 98.0  PLT 220 195 218 221 192 208   Cardiac Enzymes:  Recent Labs Lab 06/28/15 0726  TROPONINI <0.03    CBG:  Recent Labs Lab 06/25/15 0754  GLUCAP 294*     Radiological Exams on Admission: US Abdomen Complete  06/20/2015  CLINICAL DATA:  Abdominal distension, ventilated patient, history of kidney stones. EXAM: ABDOMEN ULTRASOUND COMPLETE COMPARISON:  None in PACs FINDINGS: Gallbladder: The gallbladder is mildly distended and exhibits significant wall thickening to as much as 14 mm and pericholecystic fluid. No sludge or stones are evident. A reliable sonographic Murphy's sign could not be assessed due to the patient's clinical status. Common bile duct: Diameter: 2.4 mm Liver: The liver exhibits normal echotexture. There is no focal mass nor ductal dilation. There is a small amount of ascites in the right upper quadrant. IVC: No abnormality visualized. Pancreas: Visualization of the pancreatic head and body is normal. The pancreatic tail is obscured by bowel gas. Spleen: Size and appearance within normal limits. Right Kidney: Length: 11.2 cm. The renal cortical echotexture appears normal. There is a lower pole cystic structure measuring 2.7 x 1.8 x 1.9 cm. There is no hydronephrosis. There is a trace of perinephric fluid. Left Kidney: Length: 11.5 cm. Ectatic abdominal aortic wall measuring between 2.6 and 2.7 cm. There is mural calcification Abdominal aorta: No aneurysm visualized. Other findings: None. IMPRESSION: 1. No gallstones are demonstrated. Distention of the gallbladder with marked gallbladder wall thickening and pericholecystic fluid is consistent with acute acalculous cholecystitis. 2. Normal appearance of the liver. Small amount of ascites in the right  upper quadrant and adjacent to the right kidney. 3. No hydronephrosis. There is a lower pole cyst in the right kidney. 4. Atherosclerotic changes of the abdominal aorta with maximal diameter of 2.7 mm. 5. These results will be called to the ordering clinician or representative by the Radiologist Assistant, and communication documented in the PACS or zVision Dashboard. Electronically Signed   By: David  [place] M.D.   On: 07/09/2015 13:10   Dg Chest Port 1 View  06/29/2015  CLINICAL DATA:  Respiratory failure EXAM: PORTABLE CHEST 1 VIEW COMPARISON:  Jul 05, 2015 FINDINGS: The endotracheal tube tip is above the carina. The heart size appears normal. There is no pleural effusion or edema. Lungs are hyperinflated with diffuse coarsened interstitial markings noted bilaterally. IMPRESSION: 1. Satisfactory position of ET tube. 2. Hyperinflation and interstitial changes of advanced COPD. Electronically Signed   By: Signa Kell M.D.   On: 07/06/2015 11:02   Dg Abd Portable 1v  07/04/2015  CLINICAL DATA:  Encounter for OG tube placement EXAM: PORTABLE ABDOMEN - 1 VIEW COMPARISON:  04/16/2015 FINDINGS: Orogastric tube is in place with tip to level of the proximal stomach. Consider advancing the tube further into the stomach by 10- 12 cm. There is mild dilatation of small bowel loops, primarily within the left upper quadrant. There is no free intraperitoneal air beneath the diaphragm. Bilateral pleural effusions are present. IMPRESSION: 1. Interval placement of orogastric tube. Consider advancing to 11-2010 cm. 2. Mild dilatation of small bowel loops consistent with ileus or early obstruction. 3. Bilateral pleural effusions. Electronically Signed   By: Norva Pavlov M.D.   On: 06/20/2015 19:59   Assessment/Plan Present on Admission:  . Acute respiratory failure with hypercapnia (HCC) . COPD with exacerbation (HCC) . Encephalopathy, metabolic . Protein calorie malnutrition (HCC) . Tobacco abuse . Essential  hypertension . Acute on chronic respiratory failure (HCC)  PLAN:  Respiratory failure:   Appreciate Dr Juanetta Gosling help.   Will continue with weaning.  Likely will fail extubation today.  CPAP as tolerated.  UGI:  Appreciate GI consultation.  Will continue with PPI drip for at least 48 hours.  Follow H and H.  HTN:  Continue with meds and continue to monitor.  Tobacco use:  Deferred.  Other plans as per orders. Code Status: FULL  Unk Lightning, MD.  FACP [facility] Pager [phone] 7pm to 7am.  06/25/2015, 9:33 AM

## 2015-06-25 NOTE — Progress Notes (Signed)
Subjective: He remains intubated and on the ventilator. He is weaning but I don't think he is ready for extubation. He had a lot of secretions yesterday.  Objective: Vital signs in last 24 hours: Temp:  [97 F (36.1 C)-98.9 F (37.2 C)] 97.9 F (36.6 C) (05/13 0400) Pulse Rate:  [54-81] 80 (05/13 0800) Resp:  [9-20] 20 (05/13 0800) BP: (132-188)/(68-88) 150/68 mmHg (05/13 0800) SpO2:  [98 %-100 %] 99 % (05/13 0913) FiO2 (%):  [35 %-40 %] 35 % (05/13 0913) Weight:  [70.1 kg (154 lb 8.7 oz)] 70.1 kg (154 lb 8.7 oz) (05/13 0400) Weight change: 4.2 kg (9 lb 4.2 oz)    Intake/Output from previous day: 05/12 0701 - 05/13 0700 In: 3031 [I.V.:2881; IV Piggyback:150] Out: [place] [Urine:1275]  PHYSICAL EXAM General appearance: He remains intubated. He is still receiving some sedation. He is on CPAP through the ventilator. He does make some eye contact and opens his eyes when requested Resp: rhonchi bilaterally and wheezes bilaterally Cardio: regular rate and rhythm, S1, S2 normal, no murmur, click, rub or gallop GI: soft, non-tender; bowel sounds normal; no masses,  no organomegaly Extremities: extremities normal, atraumatic, no cyanosis or edema  Lab Results:  Results for orders placed or performed during the hospital encounter of 07/08/2015 (from the past 48 hour(s))  Blood gas, arterial     Status: Abnormal   Collection Time: 06/23/15 12:25 PM  Result Value Ref Range   FIO2 0.40    Delivery systems VENTILATOR    Mode PRESSURE REGULATED VOLUME CONTROL    VT 600 mL   LHR 14.0 resp/min   Peep/cpap 5.0 cm H20   pH, Arterial 7.513 (H) 7.350 - 7.450   pCO2 arterial 34.0 (L) 35.0 - 45.0 mmHg   pO2, Arterial 180 (H) 80.0 - 100.0 mmHg   Bicarbonate 28.4 (H) 20.0 - 24.0 mEq/L   Acid-Base Excess 4.0 (H) 0.0 - 2.0 mmol/L   O2 Saturation 99.4 %   Patient temperature 37.0    Collection site LEFT BRACHIAL    Drawn by [id number]    Sample type ARTERIAL DRAW    Allens test (pass/fail) PASS PASS   Blood gas, arterial     Status: Abnormal   Collection Time: 06/23/15  5:25 PM  Result Value Ref Range   FIO2 0.40    Delivery systems VENTILATOR    Mode PRESSURE REGULATED VOLUME CONTROL    VT 550 mL   LHR 12 resp/min   Peep/cpap 5.0 cm H20   pH, Arterial 7.313 (L) 7.350 - 7.450   pCO2 arterial 58.8 (HH) 35.0 - 45.0 mmHg    Comment: CRITICAL RESULT CALLED TO, READ BACK BY AND VERIFIED WITH: M.GRAY RN,AT 1743 BY WENDY VIA,RRT,RCP ON 06/23/15    pO2, Arterial 130 (H) 80.0 - 100.0 mmHg   Bicarbonate 26.2 (H) 20.0 - 24.0 mEq/L   Acid-Base Excess 3.2 (H) 0.0 - 2.0 mmol/L   O2 Saturation 98.2 %   Patient temperature 37.0    Collection site LEFT BRACHIAL    Drawn by [id number]    Sample type ARTERIAL DRAW    Allens test (pass/fail) PASS PASS  Blood gas, arterial     Status: Abnormal   Collection Time: 06/23/15  7:35 PM  Result Value Ref Range   FIO2 40.00    Delivery systems VENTILATOR    Mode PRESSURE REGULATED VOLUME CONTROL    VT 550 mL   LHR 12 resp/min   Peep/cpap 5.0 cm H20   pH, Arterial  7.374 7.350 - 7.450   pCO2 arterial 50.4 (H) 35.0 - 45.0 mmHg   pO2, Arterial 163 (H) 80.0 - 100.0 mmHg   Bicarbonate 27.5 (H) 20.0 - 24.0 mEq/L   TCO2 13.3 0 - 100 mmol/L   Acid-Base Excess 3.9 (H) 0.0 - 2.0 mmol/L   O2 Saturation 99.3 %   Patient temperature 37.0    Collection site RIGHT RADIAL    Drawn by [id number]    Sample type ARTERIAL    Allens test (pass/fail) PASS PASS  Blood gas, arterial     Status: Abnormal   Collection Time: 06/28/2015  8:30 AM  Result Value Ref Range   FIO2 0.40    Delivery systems VENTILATOR    Mode PRESSURE REGULATED VOLUME CONTROL    VT 550 mL   LHR 12 resp/min   Peep/cpap 5.0 cm H20   pH, Arterial 7.340 (L) 7.350 - 7.450   pCO2 arterial 55.4 (H) 35.0 - 45.0 mmHg   pO2, Arterial 132 (H) 80.0 - 100.0 mmHg   Bicarbonate 27.0 (H) 20.0 - 24.0 mEq/L   Acid-Base Excess 3.7 (H) 0.0 - 2.0 mmol/L   O2 Saturation 98.5 %   Patient temperature 37.0     Collection site LEFT BRACHIAL    Drawn by [id number]    Sample type ARTERIAL DRAW    Allens test (pass/fail) PASS PASS  CBC     Status: Abnormal   Collection Time: 06/15/2015  8:58 AM  Result Value Ref Range   WBC 10.1 4.0 - 10.5 K/uL   RBC 3.71 (L) 4.22 - 5.81 MIL/uL   Hemoglobin 11.4 (L) 13.0 - 17.0 g/dL   HCT 36.2 (L) 39.0 - 52.0 %   MCV 97.6 78.0 - 100.0 fL   MCH 30.7 26.0 - 34.0 pg   MCHC 31.5 30.0 - 36.0 g/dL   RDW 15.1 11.5 - 15.5 %   Platelets 218 150 - 400 K/uL  CBC     Status: Abnormal   Collection Time: 06/21/2015  2:12 PM  Result Value Ref Range   WBC 11.5 (H) 4.0 - 10.5 K/uL   RBC 3.80 (L) 4.22 - 5.81 MIL/uL   Hemoglobin 11.6 (L) 13.0 - 17.0 g/dL   HCT 37.2 (L) 39.0 - 52.0 %   MCV 97.9 78.0 - 100.0 fL   MCH 30.5 26.0 - 34.0 pg   MCHC 31.2 30.0 - 36.0 g/dL   RDW 15.3 11.5 - 15.5 %   Platelets 221 150 - 400 K/uL  CBC     Status: Abnormal   Collection Time: 06/18/2015  8:50 PM  Result Value Ref Range   WBC 9.5 4.0 - 10.5 K/uL   RBC 3.43 (L) 4.22 - 5.81 MIL/uL   Hemoglobin 10.4 (L) 13.0 - 17.0 g/dL   HCT 33.6 (L) 39.0 - 52.0 %   MCV 98.0 78.0 - 100.0 fL   MCH 30.3 26.0 - 34.0 pg   MCHC 31.0 30.0 - 36.0 g/dL   RDW 15.4 11.5 - 15.5 %   Platelets 192 150 - 400 K/uL  CBC     Status: Abnormal   Collection Time: 06/25/15  2:22 AM  Result Value Ref Range   WBC 9.9 4.0 - 10.5 K/uL   RBC 3.46 (L) 4.22 - 5.81 MIL/uL   Hemoglobin 10.6 (L) 13.0 - 17.0 g/dL   HCT 33.9 (L) 39.0 - 52.0 %   MCV 98.0 78.0 - 100.0 fL   MCH 30.6 26.0 - 34.0 pg   MCHC 31.3  30.0 - 36.0 g/dL   RDW 15.4 11.5 - 15.5 %   Platelets 208 150 - 400 K/uL  Blood gas, arterial     Status: Abnormal   Collection Time: 06/25/15  4:05 AM  Result Value Ref Range   FIO2 0.35    Delivery systems VENTILATOR    Mode PRESSURE REGULATED VOLUME CONTROL    VT 550 mL   LHR 12 resp/min   Peep/cpap 3.0 cm H20   pH, Arterial 7.342 (L) 7.350 - 7.450   pCO2 arterial 55.8 (H) 35.0 - 45.0 mmHg   pO2, Arterial 104.0 (H) 80.0  - 100.0 mmHg   Bicarbonate 27.4 (H) 20.0 - 24.0 mEq/L   Acid-Base Excess 4.1 (H) 0.0 - 2.0 mmol/L   O2 Saturation 97.6 %   Collection site RADIAL    Drawn by [id number]    Sample type ARTERIAL    Allens test (pass/fail) PASS PASS  Glucose, capillary     Status: Abnormal   Collection Time: 06/25/15  7:54 AM  Result Value Ref Range   Glucose-Capillary 294 (H) 65 - 99 mg/dL   Comment 1 Notify RN    Comment 2 Document in Chart     ABGS  Recent Labs  06/23/15 1935  06/25/15 0405  PHART 7.374  < > 7.342*  PO2ART 163*  < > 104.0*  TCO2 13.3  --   --   HCO3 27.5*  < > 27.4*  < > = values in this interval not displayed. CULTURES Recent Results (from the past 240 hour(s))  MRSA PCR Screening     Status: None   Collection Time: 06/17/2015  2:10 PM  Result Value Ref Range Status   MRSA by PCR NEGATIVE NEGATIVE Final    Comment:        The GeneXpert MRSA Assay (FDA approved for NASAL specimens only), is one component of a comprehensive MRSA colonization surveillance program. It is not intended to diagnose MRSA infection nor to guide or monitor treatment for MRSA infections.    Studies/Results: US Abdomen Complete  06/27/2015  CLINICAL DATA:  Abdominal distension, ventilated patient, history of kidney stones. EXAM: ABDOMEN ULTRASOUND COMPLETE COMPARISON:  None in PACs FINDINGS: Gallbladder: The gallbladder is mildly distended and exhibits significant wall thickening to as much as 14 mm and pericholecystic fluid. No sludge or stones are evident. A reliable sonographic Murphy's sign could not be assessed due to the patient's clinical status. Common bile duct: Diameter: 2.4 mm Liver: The liver exhibits normal echotexture. There is no focal mass nor ductal dilation. There is a small amount of ascites in the right upper quadrant. IVC: No abnormality visualized. Pancreas: Visualization of the pancreatic head and body is normal. The pancreatic tail is obscured by bowel gas. Spleen: Size and  appearance within normal limits. Right Kidney: Length: 11.2 cm. The renal cortical echotexture appears normal. There is a lower pole cystic structure measuring 2.7 x 1.8 x 1.9 cm. There is no hydronephrosis. There is a trace of perinephric fluid. Left Kidney: Length: 11.5 cm. Ectatic abdominal aortic wall measuring between 2.6 and 2.7 cm. There is mural calcification Abdominal aorta: No aneurysm visualized. Other findings: None. IMPRESSION: 1. No gallstones are demonstrated. Distention of the gallbladder with marked gallbladder wall thickening and pericholecystic fluid is consistent with acute acalculous cholecystitis. 2. Normal appearance of the liver. Small amount of ascites in the right upper quadrant and adjacent to the right kidney. 3. No hydronephrosis. There is a lower pole cyst in the right kidney.  4. Atherosclerotic changes of the abdominal aorta with maximal diameter of 2.7 mm. 5. These results will be called to the ordering clinician or representative by the Radiologist Assistant, and communication documented in the PACS or zVision Dashboard. Electronically Signed   By: David  Martinique M.D.   On: 06/25/2015 13:10   Dg Chest Port 1 View  07/10/2015  CLINICAL DATA:  Respiratory failure EXAM: PORTABLE CHEST 1 VIEW COMPARISON:  06/13/2015 FINDINGS: The endotracheal tube tip is above the carina. The heart size appears normal. There is no pleural effusion or edema. Lungs are hyperinflated with diffuse coarsened interstitial markings noted bilaterally. IMPRESSION: 1. Satisfactory position of ET tube. 2. Hyperinflation and interstitial changes of advanced COPD. Electronically Signed   By: Kerby Moors M.D.   On: 06/26/2015 11:02   Dg Abd Portable 1v  06/28/2015  CLINICAL DATA:  Encounter for OG tube placement EXAM: PORTABLE ABDOMEN - 1 VIEW COMPARISON:  04/16/2015 FINDINGS: Orogastric tube is in place with tip to level of the proximal stomach. Consider advancing the tube further into the stomach by 10- 12  cm. There is mild dilatation of small bowel loops, primarily within the left upper quadrant. There is no free intraperitoneal air beneath the diaphragm. Bilateral pleural effusions are present. IMPRESSION: 1. Interval placement of orogastric tube. Consider advancing to 11-2010 cm. 2. Mild dilatation of small bowel loops consistent with ileus or early obstruction. 3. Bilateral pleural effusions. Electronically Signed   By: Nolon Nations M.D.   On: 07/02/2015 19:59    Medications:  Prior to Admission:  Prescriptions prior to admission  Medication Sig Dispense Refill Last Dose  . albuterol (PROVENTIL HFA;VENTOLIN HFA) 108 (90 BASE) MCG/ACT inhaler Inhale 2 puffs into the lungs every 4 (four) hours as needed for wheezing or shortness of breath. For shortness of breath   06/15/2015 at Unknown time  . amLODipine (NORVASC) 5 MG tablet Take 1 tablet by mouth daily.   06/21/2015 at Unknown time  . DULERA 100-5 MCG/ACT AERO Inhale 2 puffs into the lungs 2 (two) times daily.   07/07/2015 at Unknown time  . lisinopril (PRINIVIL,ZESTRIL) 20 MG tablet Take 20 mg by mouth every morning.    06/21/2015 at Unknown time  . traZODone (DESYREL) 100 MG tablet Take 0.5-1 tablets (50-100 mg total) by mouth at bedtime. 30 tablet 1 06/21/2015 at Unknown time  . feeding supplement, ENSURE ENLIVE, (ENSURE ENLIVE) LIQD Take 237 mLs by mouth 2 (two) times daily between meals. (Patient not taking: Reported on 06/23/2015)     . ipratropium-albuterol (DUONEB) 0.5-2.5 (3) MG/3ML SOLN Take 3 mLs by nebulization 2 (two) times daily. (Patient not taking: Reported on 07/06/2015)     . nicotine (NICODERM CQ - DOSED IN MG/24 HOURS) 21 mg/24hr patch Place 1 patch (21 mg total) onto the skin daily. (Patient not taking: Reported on 06/27/2015)     . predniSONE (DELTASONE) 10 MG tablet Take with breakfast daily for 2 more days. (Patient not taking: Reported on 06/26/2015)     . risperiDONE (RISPERDAL) 0.25 MG tablet Take 1 tablet (0.25 mg total) by  mouth at bedtime. (Patient not taking: Reported on 06/23/2015) 30 tablet 1   . senna-docusate (SENOKOT-S) 8.6-50 MG tablet Take 1 tablet by mouth 2 (two) times daily. (Patient not taking: Reported on 07/09/2015)      Scheduled: . antiseptic oral rinse  7 mL Mouth Rinse QID  . chlorhexidine gluconate (SAGE KIT)  15 mL Mouth Rinse BID  . insulin aspart  0-15 Units  Subcutaneous Q4H  . ipratropium-albuterol  3 mL Nebulization Q6H  . levofloxacin (LEVAQUIN) IV  750 mg Intravenous Q24H  . methylPREDNISolone (SOLU-MEDROL) injection  60 mg Intravenous Q6H  . [START ON 06/27/2015] pantoprazole (PROTONIX) IV  40 mg Intravenous Q12H  . sodium chloride flush  3 mL Intravenous Q12H   Continuous: . dextrose 5 % and 0.9% NaCl 100 mL/hr at 06/25/15 0921  . feeding supplement (JEVITY 1.2 CAL)    . pantoprozole (PROTONIX) infusion 8 mg/hr (06/25/15 0833)  . propofol (DIPRIVAN) infusion 15 mcg/kg/min (06/25/15 0852)   IZT:[id number], fentaNYL (SUBLIMAZE) injection, ondansetron **OR** ondansetron (ZOFRAN) IV  Assesment: He has acute on chronic hypoxic/hypercapnic respiratory failure with COPD exacerbation. He has a lot of secretions. He is still wheezing. Although he is undergoing weaning protocol and is currently on CPAP he is not ready for extubation.  He has metabolic encephalopathy which is in addition to his baseline dementia Principal Problem:   Acute respiratory failure with hypercapnia ([place]) Active Problems:   COPD with exacerbation ([facility])   Tobacco abuse   Essential hypertension   Protein calorie malnutrition ([facility])   Encephalopathy, metabolic   Acute on chronic respiratory failure ([place])    Plan: Continue treatments. He can remain on CPAP and see how he's doing as far as his baseline respiratory function but no plans for extubation at this point    LOS: 3 days   Fabiana Dromgoole L 06/25/2015, 9:25 AM

## 2015-06-26 ENCOUNTER — Inpatient Hospital Stay (HOSPITAL_COMMUNITY): Payer: Medicare Other

## 2015-06-26 LAB — COMPREHENSIVE METABOLIC PANEL
ALBUMIN: 2.2 g/dL — AB (ref 3.5–5.0)
ALT: 10 U/L — ABNORMAL LOW (ref 17–63)
ANION GAP: 0 — AB (ref 5–15)
AST: 12 U/L — ABNORMAL LOW (ref 15–41)
Alkaline Phosphatase: 52 U/L (ref 38–126)
BILIRUBIN TOTAL: 0.2 mg/dL — AB (ref 0.3–1.2)
BUN: 31 mg/dL — ABNORMAL HIGH (ref 6–20)
CO2: 33 mmol/L — ABNORMAL HIGH (ref 22–32)
Calcium: 7.8 mg/dL — ABNORMAL LOW (ref 8.9–10.3)
Chloride: 110 mmol/L (ref 101–111)
Creatinine, Ser: 0.56 mg/dL — ABNORMAL LOW (ref 0.61–1.24)
GFR calc Af Amer: >60 mL/min (ref >60)
GFR calc non Af Amer: >60 mL/min (ref >60)
GLUCOSE: 203 mg/dL — AB (ref 65–99)
POTASSIUM: 4.5 mmol/L (ref 3.5–5.1)
Sodium: 143 mmol/L (ref 135–145)
TOTAL PROTEIN: 4.8 g/dL — AB (ref 6.5–8.1)

## 2015-06-26 LAB — BLOOD GAS, ARTERIAL
Acid-Base Excess: 6.8 mmol/L — ABNORMAL HIGH (ref 0.0–2.0)
BICARBONATE: 29.7 meq/L — AB (ref 20.0–24.0)
DRAWN BY: 419771
FIO2: 0.35
O2 Saturation: 96.8 %
PCO2 ART: 59.1 mmHg — AB (ref 35.0–45.0)
PEEP: 3 cmH2O
PH ART: 7.356 (ref 7.350–7.450)
PO2 ART: 89.8 mmHg (ref 80.0–100.0)
RATE: 12 resp/min
VT: 550 mL

## 2015-06-26 LAB — GLUCOSE, CAPILLARY
GLUCOSE-CAPILLARY: 199 mg/dL — AB (ref 65–99)
GLUCOSE-CAPILLARY: 198 mg/dL — AB (ref 65–99)
GLUCOSE-CAPILLARY: 222 mg/dL — AB (ref 65–99)
Glucose-Capillary: 195 mg/dL — ABNORMAL HIGH (ref 65–99)
Glucose-Capillary: 207 mg/dL — ABNORMAL HIGH (ref 65–99)
Glucose-Capillary: 212 mg/dL — ABNORMAL HIGH (ref 65–99)

## 2015-06-26 LAB — TRIGLYCERIDES: Triglycerides: 75 mg/dL (ref <150)

## 2015-06-26 NOTE — Progress Notes (Signed)
Subjective: He is less wheezy this morning. However he is continuing to have a lot of secretions per respiratory. Yesterday he developed increased heart rate and blood pressure with weaning attempt.  Objective: Vital signs in last 24 hours: Temp:  [97.5 F (36.4 C)-98.5 F (36.9 C)] 97.6 F (36.4 C) (05/14 0800) Pulse Rate:  [57-112] 71 (05/14 0901) Resp:  [0-49] 12 (05/14 0901) BP: (122-158)/(52-81) 147/58 mmHg (05/14 0800) SpO2:  [96 %-100 %] 96 % (05/14 0901) FiO2 (%):  [35 %] 35 % (05/14 0901) Weight:  [72.6 kg (160 lb 0.9 oz)] 72.6 kg (160 lb 0.9 oz) (05/14 0500) Weight change: 2.5 kg (5 lb 8.2 oz)    Intake/Output from previous day: 05/13 0701 - 05/14 0700 In: 4480.5 [I.V.:3970.5; NG/GT:360; IV Piggyback:150] Out: 7106 [Urine:1650]  PHYSICAL EXAM General appearance: Intubated and sedated on mechanical ventilation Resp: rhonchi bilaterally Cardio: irregularly irregular rhythm GI: soft, non-tender; bowel sounds normal; no masses,  no organomegaly Extremities: extremities normal, atraumatic, no cyanosis or edema  Lab Results:  Results for orders placed or performed during the hospital encounter of 07/10/2015 (from the past 48 hour(s))  CBC     Status: Abnormal   Collection Time: 06/27/2015  2:12 PM  Result Value Ref Range   WBC 11.5 (H) 4.0 - 10.5 K/uL   RBC 3.80 (L) 4.22 - 5.81 MIL/uL   Hemoglobin 11.6 (L) 13.0 - 17.0 g/dL   HCT 37.2 (L) 39.0 - 52.0 %   MCV 97.9 78.0 - 100.0 fL   MCH 30.5 26.0 - 34.0 pg   MCHC 31.2 30.0 - 36.0 g/dL   RDW 15.3 11.5 - 15.5 %   Platelets 221 150 - 400 K/uL  Culture, respiratory (NON-Expectorated)     Status: None (Preliminary result)   Collection Time: 07/08/2015  4:00 PM  Result Value Ref Range   Specimen Description TRACHEAL ASPIRATE    Special Requests Immunocompromised    Gram Stain      MODERATE WBC PRESENT, PREDOMINANTLY PMN NO SQUAMOUS EPITHELIAL CELLS SEEN NO ORGANISMS SEEN Performed at [facility]    Culture NO  GROWTH Performed at [facility]     Report Status PENDING   CBC     Status: Abnormal   Collection Time: 06/16/2015  8:50 PM  Result Value Ref Range   WBC 9.5 4.0 - 10.5 K/uL   RBC 3.43 (L) 4.22 - 5.81 MIL/uL   Hemoglobin 10.4 (L) 13.0 - 17.0 g/dL   HCT 33.6 (L) 39.0 - 52.0 %   MCV 98.0 78.0 - 100.0 fL   MCH 30.3 26.0 - 34.0 pg   MCHC 31.0 30.0 - 36.0 g/dL   RDW 15.4 11.5 - 15.5 %   Platelets 192 150 - 400 K/uL  CBC     Status: Abnormal   Collection Time: 06/25/15  2:22 AM  Result Value Ref Range   WBC 9.9 4.0 - 10.5 K/uL   RBC 3.46 (L) 4.22 - 5.81 MIL/uL   Hemoglobin 10.6 (L) 13.0 - 17.0 g/dL   HCT 33.9 (L) 39.0 - 52.0 %   MCV 98.0 78.0 - 100.0 fL   MCH 30.6 26.0 - 34.0 pg   MCHC 31.3 30.0 - 36.0 g/dL   RDW 15.4 11.5 - 15.5 %   Platelets 208 150 - 400 K/uL  Blood gas, arterial     Status: Abnormal   Collection Time: 06/25/15  4:05 AM  Result Value Ref Range   FIO2 0.35    Delivery systems  VENTILATOR    Mode PRESSURE REGULATED VOLUME CONTROL    VT 550 mL   LHR 12 resp/min   Peep/cpap 3.0 cm H20   pH, Arterial 7.342 (L) 7.350 - 7.450   pCO2 arterial 55.8 (H) 35.0 - 45.0 mmHg   pO2, Arterial 104.0 (H) 80.0 - 100.0 mmHg   Bicarbonate 27.4 (H) 20.0 - 24.0 mEq/L   Acid-Base Excess 4.1 (H) 0.0 - 2.0 mmol/L   O2 Saturation 97.6 %   Collection site RADIAL    Drawn by [id number]    Sample type ARTERIAL    Allens test (pass/fail) PASS PASS  Glucose, capillary     Status: Abnormal   Collection Time: 06/25/15  7:54 AM  Result Value Ref Range   Glucose-Capillary 294 (H) 65 - 99 mg/dL   Comment 1 Notify RN    Comment 2 Document in Chart   Glucose, capillary     Status: Abnormal   Collection Time: 06/25/15 12:05 PM  Result Value Ref Range   Glucose-Capillary 179 (H) 65 - 99 mg/dL   Comment 1 Notify RN    Comment 2 Document in Chart   Glucose, capillary     Status: Abnormal   Collection Time: 06/25/15  4:19 PM  Result Value Ref Range   Glucose-Capillary 190 (H) 65 - 99  mg/dL   Comment 1 Notify RN    Comment 2 Document in Chart   Glucose, capillary     Status: Abnormal   Collection Time: 06/25/15  7:34 PM  Result Value Ref Range   Glucose-Capillary 191 (H) 65 - 99 mg/dL   Comment 1 Notify RN   Glucose, capillary     Status: Abnormal   Collection Time: 06/25/15 11:49 PM  Result Value Ref Range   Glucose-Capillary 169 (H) 65 - 99 mg/dL   Comment 1 Notify RN   Blood gas, arterial     Status: Abnormal   Collection Time: 06/26/15  3:28 AM  Result Value Ref Range   FIO2 0.35    Delivery systems VENTILATOR    Mode PRESSURE REGULATED VOLUME CONTROL    VT 550 mL   LHR 12 resp/min   Peep/cpap 3.0 cm H20   pH, Arterial 7.356 7.350 - 7.450   pCO2 arterial 59.1 (HH) 35.0 - 45.0 mmHg   pO2, Arterial 89.8 80.0 - 100.0 mmHg   Bicarbonate 29.7 (H) 20.0 - 24.0 mEq/L   Acid-Base Excess 6.8 (H) 0.0 - 2.0 mmol/L   O2 Saturation 96.8 %   Collection site RIGHT RADIAL    Drawn by [id number]    Sample type ARTERIAL DRAW    Allens test (pass/fail) PASS PASS  Glucose, capillary     Status: Abnormal   Collection Time: 06/26/15  3:38 AM  Result Value Ref Range   Glucose-Capillary 199 (H) 65 - 99 mg/dL   Comment 1 Notify RN   Triglycerides     Status: None   Collection Time: 06/26/15  4:40 AM  Result Value Ref Range   Triglycerides 75 <150 mg/dL  Comprehensive metabolic panel     Status: Abnormal   Collection Time: 06/26/15  4:40 AM  Result Value Ref Range   Sodium 143 135 - 145 mmol/L   Potassium 4.5 3.5 - 5.1 mmol/L   Chloride 110 101 - 111 mmol/L   CO2 33 (H) 22 - 32 mmol/L   Glucose, Bld 203 (H) 65 - 99 mg/dL   BUN 31 (H) 6 - 20 mg/dL   Creatinine, Ser 0.56 (  L) 0.61 - 1.24 mg/dL   Calcium 7.8 (L) 8.9 - 10.3 mg/dL   Total Protein 4.8 (L) 6.5 - 8.1 g/dL   Albumin 2.2 (L) 3.5 - 5.0 g/dL   AST 12 (L) 15 - 41 U/L   ALT 10 (L) 17 - 63 U/L   Alkaline Phosphatase 52 38 - 126 U/L   Total Bilirubin 0.2 (L) 0.3 - 1.2 mg/dL   GFR calc non Af Amer >60 >60 mL/min    GFR calc Af Amer >60 >60 mL/min    Comment: (NOTE) The eGFR has been calculated using the CKD EPI equation. This calculation has not been validated in all clinical situations. eGFR's persistently <60 mL/min signify possible Chronic Kidney Disease.    Anion gap 0 (L) 5 - 15  Glucose, capillary     Status: Abnormal   Collection Time: 06/26/15  7:40 AM  Result Value Ref Range   Glucose-Capillary 198 (H) 65 - 99 mg/dL   Comment 1 Notify RN    Comment 2 Document in Chart     ABGS  Recent Labs  06/23/15 1935  06/26/15 0328  PHART 7.374  < > 7.356  PO2ART 163*  < > 89.8  TCO2 13.3  --   --   HCO3 27.5*  < > 29.7*  < > = values in this interval not displayed. CULTURES Recent Results (from the past 240 hour(s))  MRSA PCR Screening     Status: None   Collection Time: 06/16/2015  2:10 PM  Result Value Ref Range Status   MRSA by PCR NEGATIVE NEGATIVE Final    Comment:        The GeneXpert MRSA Assay (FDA approved for NASAL specimens only), is one component of a comprehensive MRSA colonization surveillance program. It is not intended to diagnose MRSA infection nor to guide or monitor treatment for MRSA infections.   Culture, respiratory (NON-Expectorated)     Status: None (Preliminary result)   Collection Time: 06/15/2015  4:00 PM  Result Value Ref Range Status   Specimen Description TRACHEAL ASPIRATE  Final   Special Requests Immunocompromised  Final   Gram Stain   Final    MODERATE WBC PRESENT, PREDOMINANTLY PMN NO SQUAMOUS EPITHELIAL CELLS SEEN NO ORGANISMS SEEN Performed at [facility]    Culture NO GROWTH Performed at [facility]   Final   Report Status PENDING  Incomplete   Studies/Results: US Abdomen Complete  06/25/2015  CLINICAL DATA:  Abdominal distension, ventilated patient, history of kidney stones. EXAM: ABDOMEN ULTRASOUND COMPLETE COMPARISON:  None in PACs FINDINGS: Gallbladder: The gallbladder is mildly distended and exhibits significant  wall thickening to as much as 14 mm and pericholecystic fluid. No sludge or stones are evident. A reliable sonographic Murphy's sign could not be assessed due to the patient's clinical status. Common bile duct: Diameter: 2.4 mm Liver: The liver exhibits normal echotexture. There is no focal mass nor ductal dilation. There is a small amount of ascites in the right upper quadrant. IVC: No abnormality visualized. Pancreas: Visualization of the pancreatic head and body is normal. The pancreatic tail is obscured by bowel gas. Spleen: Size and appearance within normal limits. Right Kidney: Length: 11.2 cm. The renal cortical echotexture appears normal. There is a lower pole cystic structure measuring 2.7 x 1.8 x 1.9 cm. There is no hydronephrosis. There is a trace of perinephric fluid. Left Kidney: Length: 11.5 cm. Ectatic abdominal aortic wall measuring between 2.6 and 2.7 cm. There is mural calcification  Abdominal aorta: No aneurysm visualized. Other findings: None. IMPRESSION: 1. No gallstones are demonstrated. Distention of the gallbladder with marked gallbladder wall thickening and pericholecystic fluid is consistent with acute acalculous cholecystitis. 2. Normal appearance of the liver. Small amount of ascites in the right upper quadrant and adjacent to the right kidney. 3. No hydronephrosis. There is a lower pole cyst in the right kidney. 4. Atherosclerotic changes of the abdominal aorta with maximal diameter of 2.7 mm. 5. These results will be called to the ordering clinician or representative by the Radiologist Assistant, and communication documented in the PACS or zVision Dashboard. Electronically Signed   By: David  Martinique M.D.   On: 06/25/2015 13:10   Dg Chest Port 1 View  06/25/2015  CLINICAL DATA:  71 year old male with history of intubation. Followup study. EXAM: PORTABLE CHEST 1 VIEW COMPARISON:  Chest x-ray 06/23/2015. FINDINGS: An endotracheal tube is in place with tip 5.4 cm above the carina. A  nasogastric tube is seen extending into the stomach, however, the tip of the nasogastric tube extends below the lower margin of the image. Emphysematous changes and hyperexpansion of lungs, similar prior studies. There are some patchy ill-defined areas of interstitial prominence and airspace disease in the lungs bilaterally which are asymmetrically distributed, which may reflect a developing multilobar bronchopneumonia, most pronounced in the left mid to upper lung and apex of the right upper lobe. No pleural effusions. No evidence of pulmonary edema. Heart size is normal. Upper mediastinal contours are within normal limits. Atherosclerosis in the thoracic aorta. IMPRESSION: 1. Support apparatus, as above. 2. Findings are concerning for developing multilobar bronchopneumonia. 3. Atherosclerosis. Electronically Signed   By: Vinnie Langton M.D.   On: 06/25/2015 13:10   Dg Chest Port 1 View  07/12/2015  CLINICAL DATA:  Respiratory failure EXAM: PORTABLE CHEST 1 VIEW COMPARISON:  07/08/2015 FINDINGS: The endotracheal tube tip is above the carina. The heart size appears normal. There is no pleural effusion or edema. Lungs are hyperinflated with diffuse coarsened interstitial markings noted bilaterally. IMPRESSION: 1. Satisfactory position of ET tube. 2. Hyperinflation and interstitial changes of advanced COPD. Electronically Signed   By: Kerby Moors M.D.   On: 06/15/2015 11:02   Dg Abd Portable 1v  06/25/2015  CLINICAL DATA:  Encounter for OG tube placement EXAM: PORTABLE ABDOMEN - 1 VIEW COMPARISON:  04/16/2015 FINDINGS: Orogastric tube is in place with tip to level of the proximal stomach. Consider advancing the tube further into the stomach by 10- 12 cm. There is mild dilatation of small bowel loops, primarily within the left upper quadrant. There is no free intraperitoneal air beneath the diaphragm. Bilateral pleural effusions are present. IMPRESSION: 1. Interval placement of orogastric tube. Consider  advancing to 11-2010 cm. 2. Mild dilatation of small bowel loops consistent with ileus or early obstruction. 3. Bilateral pleural effusions. Electronically Signed   By: Nolon Nations M.D.   On: 06/23/2015 19:59    Medications:  Prior to Admission:  Prescriptions prior to admission  Medication Sig Dispense Refill Last Dose  . albuterol (PROVENTIL HFA;VENTOLIN HFA) 108 (90 BASE) MCG/ACT inhaler Inhale 2 puffs into the lungs every 4 (four) hours as needed for wheezing or shortness of breath. For shortness of breath   06/19/2015 at Unknown time  . amLODipine (NORVASC) 5 MG tablet Take 1 tablet by mouth daily.   06/21/2015 at Unknown time  . DULERA 100-5 MCG/ACT AERO Inhale 2 puffs into the lungs 2 (two) times daily.   06/19/2015 at Unknown  time  . lisinopril (PRINIVIL,ZESTRIL) 20 MG tablet Take 20 mg by mouth every morning.    06/21/2015 at Unknown time  . traZODone (DESYREL) 100 MG tablet Take 0.5-1 tablets (50-100 mg total) by mouth at bedtime. 30 tablet 1 06/21/2015 at Unknown time  . feeding supplement, ENSURE ENLIVE, (ENSURE ENLIVE) LIQD Take 237 mLs by mouth 2 (two) times daily between meals. (Patient not taking: Reported on 06/30/2015)     . ipratropium-albuterol (DUONEB) 0.5-2.5 (3) MG/3ML SOLN Take 3 mLs by nebulization 2 (two) times daily. (Patient not taking: Reported on 07/10/2015)     . nicotine (NICODERM CQ - DOSED IN MG/24 HOURS) 21 mg/24hr patch Place 1 patch (21 mg total) onto the skin daily. (Patient not taking: Reported on 06/15/2015)     . predniSONE (DELTASONE) 10 MG tablet Take with breakfast daily for 2 more days. (Patient not taking: Reported on 06/21/2015)     . risperiDONE (RISPERDAL) 0.25 MG tablet Take 1 tablet (0.25 mg total) by mouth at bedtime. (Patient not taking: Reported on 06/18/2015) 30 tablet 1   . senna-docusate (SENOKOT-S) 8.6-50 MG tablet Take 1 tablet by mouth 2 (two) times daily. (Patient not taking: Reported on 06/18/2015)      Scheduled: . antiseptic oral rinse  7 mL  Mouth Rinse QID  . chlorhexidine gluconate (SAGE KIT)  15 mL Mouth Rinse BID  . insulin aspart  0-15 Units Subcutaneous Q4H  . ipratropium-albuterol  3 mL Nebulization Q6H  . levofloxacin (LEVAQUIN) IV  750 mg Intravenous Q24H  . methylPREDNISolone (SOLU-MEDROL) injection  60 mg Intravenous Q6H  . [START ON 06/27/2015] pantoprazole (PROTONIX) IV  40 mg Intravenous Q12H  . sodium chloride flush  3 mL Intravenous Q12H   Continuous: . dextrose 5 % and 0.9% NaCl 100 mL/hr at 06/26/15 0621  . feeding supplement (JEVITY 1.2 CAL) 1,000 mL (06/25/15 1600)  . pantoprozole (PROTONIX) infusion 8 mg/hr (06/26/15 0621)  . propofol (DIPRIVAN) infusion 45 mcg/kg/min (06/26/15 2446)   KMM:NOTRRNHAF, fentaNYL (SUBLIMAZE) injection, ondansetron **OR** ondansetron (ZOFRAN) IV  Assesment: He was admitted with acute on chronic hypercapnic/hypoxic respiratory failure. He has COPD exacerbation. At baseline he has dementia and has not been compliant with his home regimen. He has metabolic encephalopathy as well. He has protein calorie malnutrition and now has started on tube feeding. Principal Problem:   Acute respiratory failure with hypercapnia (HCC) Active Problems:   COPD with exacerbation (HCC)   Tobacco abuse   Essential hypertension   Protein calorie malnutrition (HCC)   Encephalopathy, metabolic   Acute on chronic respiratory failure ([place])    Plan: I think we should wait on trying to extubate him today considering her secretions. He does have DO NOT RESUSCITATE status but I would like to see if we can get him off and have him stay off. I agree palliative care consultation may be appropriate.    LOS: 4 days   Takeyah Wieman L 06/26/2015, 9:33 AM

## 2015-06-26 NOTE — Progress Notes (Signed)
[facility] PROGRESS NOTE  Shane Todd ZOX:[id number] DOB: January 09, 1946    PCP:   [name], MD   HPI:  Shane Todd is an 70 y.o. male with hx of severe COPD, continued smoker, dementia, refusal to wear Bipap at home, admitted with respiratory failure, hypoxic and hypercapnic and was intubated. His baseline pCO2 is likely chronically elevated, after ventilatory adjustment, his ABG is more stabalized with pH 7.34 and pCO2 in the 55 range. He had blood in this OG tube. and EGD by Dr Renae Fickle showed Gastric ulcer and non bleeding MW tear. He was clipped. He was started on TF. I had updated his family about his condition daily. His wife is also thinking about palliative care consultation next week as well.     Rewiew of Systems: Unable.   Past Medical History  Diagnosis Date  . COPD (chronic obstructive pulmonary disease) (HCC)   . Hypertension   . Tobacco abuse   . Anxiety   . Anemia 04/24/2011  . Pneumonia     hospitalized 04/2011  . Shortness of breath     with exertion  . GERD (gastroesophageal reflux disease)   . Constipation   . Eczema   . Kidney stones   . Dementia with behavioral disturbance 04/18/2015  . Acute respiratory failure with hypoxia and hypercarbia (HCC) 07/02/2013  . COPD exacerbation (HCC) 04/05/2015    Past Surgical History  Procedure Laterality Date  . Hemorrhoid surgery    . Colonoscopy    . Inguinal hernia repair Left 06/06/2012    Procedure: HERNIA REPAIR INGUINAL ADULT;  Surgeon: Shelly Rubenstein, MD;  Location: [facility] OR;  Service: General;  Laterality: Left;  . Insertion of mesh Left 06/06/2012    Procedure: INSERTION OF MESH;  Surgeon: Shelly Rubenstein, MD;  Location: [facility] OR;  Service: General;  Laterality: Left;  . Hernia repair Left 06/06/2012    Medications:  HOME MEDS: Prior to Admission medications   Medication Sig Start Date End Date Taking? Authorizing Provider  albuterol (PROVENTIL HFA;VENTOLIN HFA) 108 (90 BASE)  MCG/ACT inhaler Inhale 2 puffs into the lungs every 4 (four) hours as needed for wheezing or shortness of breath. For shortness of breath   Yes Historical Provider, MD  amLODipine (NORVASC) 5 MG tablet Take 1 tablet by mouth daily. 03/22/15  Yes Historical Provider, MD  DULERA 100-5 MCG/ACT AERO Inhale 2 puffs into the lungs 2 (two) times daily. 05/10/15  Yes Historical Provider, MD  lisinopril (PRINIVIL,ZESTRIL) 20 MG tablet Take 20 mg by mouth every morning.    Yes Historical Provider, MD  traZODone (DESYREL) 100 MG tablet Take 0.5-1 tablets (50-100 mg total) by mouth at bedtime. 04/18/15  Yes Elliot Cousin, MD  feeding supplement, ENSURE ENLIVE, (ENSURE ENLIVE) LIQD Take 237 mLs by mouth 2 (two) times daily between meals. Patient not taking: Reported on 07-16-2015 04/18/15   Elliot Cousin, MD  ipratropium-albuterol (DUONEB) 0.5-2.5 (3) MG/3ML SOLN Take 3 mLs by nebulization 2 (two) times daily. Patient not taking: Reported on 16-Jul-2015 04/18/15   Elliot Cousin, MD  nicotine (NICODERM CQ - DOSED IN MG/24 HOURS) 21 mg/24hr patch Place 1 patch (21 mg total) onto the skin daily. Patient not taking: Reported on Jul 16, 2015 04/18/15   Elliot Cousin, MD  predniSONE (DELTASONE) 10 MG tablet Take with breakfast daily for 2 more days. Patient not taking: Reported on 07/16/15 04/18/15   Elliot Cousin, MD  risperiDONE (RISPERDAL) 0.25 MG tablet Take 1 tablet (0.25 mg total) by mouth  at bedtime. Patient not taking: Reported on 07/06/2015 04/18/15   Elliot Cousin, MD  senna-docusate (SENOKOT-S) 8.6-50 MG tablet Take 1 tablet by mouth 2 (two) times daily. Patient not taking: Reported on 07/05/2015 04/18/15   Elliot Cousin, MD     Allergies:  Allergies  Allergen Reactions  . Oxycodone Nausea And Vomiting    Social History:   reports that he has been smoking.  He does not have any smokeless tobacco history on file. He reports that he does not drink alcohol or use illicit drugs.  Family History: Family History  Problem  Relation Age of Onset  . Stroke Mother   . Deep vein thrombosis Father   . Lung cancer Sister   . Stroke Sister   . Hypertension Sister   . Heart disease Brother   . Stroke Brother   . Stroke Sister   . Heart disease Sister      Physical Exam: Filed Vitals:   06/26/15 0545 06/26/15 0600 06/26/15 0615 06/26/15 0630  BP: 140/57 142/61 138/63 141/59  Pulse: 71 75 68 66  Temp:      TempSrc:      Resp: 38 48 45 26  Height:      Weight:      SpO2: 96% 96% 96% 97%   Blood pressure 141/59, pulse 66, temperature 98.3 F (36.8 C), temperature source Axillary, resp. rate 26, height 6\' 2"  (1.88 m), weight 72.6 kg (160 lb 0.9 oz), SpO2 97 %.  GEN: intubated.  Sedated.  PSYCH:  alert and oriented x4; does not appear anxious or depressed; affect is appropriate. HEENT: Mucous membranes pink and anicteric; PERRLA; EOM intact; no cervical lymphadenopathy nor thyromegaly or carotid bruit; no JVD; There were no stridor. Neck is very supple. Breasts:: Not examined CHEST WALL: No tenderness CHEST: Normal respiration, clear to auscultation bilaterally.  HEART: Regular rate and rhythm.  There are no murmur, rub, or gallops.   BACK: No kyphosis or scoliosis; no CVA tenderness ABDOMEN: soft and non-tender; no masses, no organomegaly, normal abdominal bowel sounds; no pannus; no intertriginous candida. There is no rebound and no distention. Rectal Exam: Not done EXTREMITIES: No bone or joint deformity; age-appropriate arthropathy of the hands and knees; no edema; no ulcerations.  There is no calf tenderness. Genitalia: not examined PULSES: 2+ and symmetric SKIN: Normal hydration no rash or ulceration CNS: intubated.    Labs on Admission:  Basic Metabolic Panel:  Recent Labs Lab 06/21/2015 0726 06/23/15 0430 06/26/15 0440  NA 142 142 143  K 4.2 3.3* 4.5  CL 96* 106 110  CO2 34* 30 33*  GLUCOSE 149* 216* 203*  BUN 48* 44* 31*  CREATININE 0.84 0.79 0.56*  CALCIUM 9.4 8.4* 7.8*   Liver  Function Tests:  Recent Labs Lab 06/26/15 0440  AST 12*  ALT 10*  ALKPHOS 52  BILITOT 0.2*  PROT 4.8*  ALBUMIN 2.2*   CBC:  Recent Labs Lab 07/07/2015 0726 06/23/15 0430 Jul 13, 2015 0858 07/13/15 1412 07/13/15 2050 06/25/15 0222  WBC 8.2 5.1 10.1 11.5* 9.5 9.9  NEUTROABS 6.4  --   --   --   --   --   HGB 12.4* 10.1* 11.4* 11.6* 10.4* 10.6*  HCT 39.9 31.1* 36.2* 37.2* 33.6* 33.9*  MCV 97.8 95.1 97.6 97.9 98.0 98.0  PLT 220 195 218 221 192 208   Cardiac Enzymes:  Recent Labs Lab 06/21/2015 0726  TROPONINI <0.03    CBG:  Recent Labs Lab 06/25/15 1205 06/25/15 1619 06/25/15  1934 06/25/15 2349 06/26/15 0338  GLUCAP 179* 190* 191* 169* 199*     Radiological Exams on Admission: US Abdomen Complete  07/03/2015  CLINICAL DATA:  Abdominal distension, ventilated patient, history of kidney stones. EXAM: ABDOMEN ULTRASOUND COMPLETE COMPARISON:  None in PACs FINDINGS: Gallbladder: The gallbladder is mildly distended and exhibits significant wall thickening to as much as 14 mm and pericholecystic fluid. No sludge or stones are evident. A reliable sonographic Murphy's sign could not be assessed due to the patient's clinical status. Common bile duct: Diameter: 2.4 mm Liver: The liver exhibits normal echotexture. There is no focal mass nor ductal dilation. There is a small amount of ascites in the right upper quadrant. IVC: No abnormality visualized. Pancreas: Visualization of the pancreatic head and body is normal. The pancreatic tail is obscured by bowel gas. Spleen: Size and appearance within normal limits. Right Kidney: Length: 11.2 cm. The renal cortical echotexture appears normal. There is a lower pole cystic structure measuring 2.7 x 1.8 x 1.9 cm. There is no hydronephrosis. There is a trace of perinephric fluid. Left Kidney: Length: 11.5 cm. Ectatic abdominal aortic wall measuring between 2.6 and 2.7 cm. There is mural calcification Abdominal aorta: No aneurysm visualized. Other  findings: None. IMPRESSION: 1. No gallstones are demonstrated. Distention of the gallbladder with marked gallbladder wall thickening and pericholecystic fluid is consistent with acute acalculous cholecystitis. 2. Normal appearance of the liver. Small amount of ascites in the right upper quadrant and adjacent to the right kidney. 3. No hydronephrosis. There is a lower pole cyst in the right kidney. 4. Atherosclerotic changes of the abdominal aorta with maximal diameter of 2.7 mm. 5. These results will be called to the ordering clinician or representative by the Radiologist Assistant, and communication documented in the PACS or zVision Dashboard. Electronically Signed   By: David  [place] M.D.   On: 07/09/2015 13:10   Dg Chest Port 1 View  06/25/2015  CLINICAL DATA:  70 year old male with history of intubation. Followup study. EXAM: PORTABLE CHEST 1 VIEW COMPARISON:  Chest x-ray 07/10/2015. FINDINGS: An endotracheal tube is in place with tip 5.4 cm above the carina. A nasogastric tube is seen extending into the stomach, however, the tip of the nasogastric tube extends below the lower margin of the image. Emphysematous changes and hyperexpansion of lungs, similar prior studies. There are some patchy ill-defined areas of interstitial prominence and airspace disease in the lungs bilaterally which are asymmetrically distributed, which may reflect a developing multilobar bronchopneumonia, most pronounced in the left mid to upper lung and apex of the right upper lobe. No pleural effusions. No evidence of pulmonary edema. Heart size is normal. Upper mediastinal contours are within normal limits. Atherosclerosis in the thoracic aorta. IMPRESSION: 1. Support apparatus, as above. 2. Findings are concerning for developing multilobar bronchopneumonia. 3. Atherosclerosis. Electronically Signed   By: Trudie Reed M.D.   On: 06/25/2015 13:10   Dg Chest Port 1 View  06/19/2015  CLINICAL DATA:  Respiratory failure EXAM:  PORTABLE CHEST 1 VIEW COMPARISON:  07/05/2015 FINDINGS: The endotracheal tube tip is above the carina. The heart size appears normal. There is no pleural effusion or edema. Lungs are hyperinflated with diffuse coarsened interstitial markings noted bilaterally. IMPRESSION: 1. Satisfactory position of ET tube. 2. Hyperinflation and interstitial changes of advanced COPD. Electronically Signed   By: Signa Kell M.D.   On: 07/07/2015 11:02   Dg Abd Portable 1v  07/11/2015  CLINICAL DATA:  Encounter for OG tube placement EXAM:  PORTABLE ABDOMEN - 1 VIEW COMPARISON:  04/16/2015 FINDINGS: Orogastric tube is in place with tip to level of the proximal stomach. Consider advancing the tube further into the stomach by 10- 12 cm. There is mild dilatation of small bowel loops, primarily within the left upper quadrant. There is no free intraperitoneal air beneath the diaphragm. Bilateral pleural effusions are present. IMPRESSION: 1. Interval placement of orogastric tube. Consider advancing to 11-2010 cm. 2. Mild dilatation of small bowel loops consistent with ileus or early obstruction. 3. Bilateral pleural effusions. Electronically Signed   By: Norva PavlovElizabeth  Brown M.D.   On: 07/13/2015 19:59   Assessment/Plan Present on Admission:  . Acute respiratory failure with hypercapnia (HCC) . COPD with exacerbation (HCC) . Encephalopathy, metabolic . Protein calorie malnutrition (HCC) . Tobacco abuse . Essential hypertension . Acute on chronic respiratory failure (HCC)  PLAN:  Respiratory failure: Appreciate Dr Juanetta GoslingHawkins help. Will continue with weaning. I spoke with his wife today.  Given his deterioration over this past 6 months, she feels that its best to strongly consider palliative care treatment for him.  We will try to continue to treat him for now, but she doesn't want him re-intubated, and would like to have him DNR at this time.  I will consult palliative care at this time.    UGI: Appreciate GI  consultation. Will continue with PPI drip for at least 48 hours. Follow H and H.  HTN: Continue with meds and continue to monitor.  Tobacco use: Deferred.   Other plans as per orders. Code Status: DNR.   Houston SirenLE,Gwen Edler, MD.  FACP [facility] Pager [phone] 7pm to 7am.  06/26/2015, 8:01 AM

## 2015-06-27 ENCOUNTER — Encounter (HOSPITAL_COMMUNITY): Payer: Self-pay | Admitting: Primary Care

## 2015-06-27 DIAGNOSIS — Z515 Encounter for palliative care: Secondary | ICD-10-CM

## 2015-06-27 DIAGNOSIS — Z7189 Other specified counseling: Secondary | ICD-10-CM | POA: Insufficient documentation

## 2015-06-27 LAB — BLOOD GAS, ARTERIAL
ACID-BASE EXCESS: 7.2 mmol/L — AB (ref 0.0–2.0)
BICARBONATE: 29.2 meq/L — AB (ref 20.0–24.0)
DRAWN BY: 105551
FIO2: 0.35
MECHVT: 550 mL
O2 SAT: 97.1 %
PCO2 ART: 65.3 mmHg — AB (ref 35.0–45.0)
PEEP/CPAP: 3 cmH2O
PH ART: 7.325 — AB (ref 7.350–7.450)
PO2 ART: 98.5 mmHg (ref 80.0–100.0)
RATE: 12 resp/min

## 2015-06-27 LAB — CBC
HCT: 37.1 % — ABNORMAL LOW (ref 39.0–52.0)
Hemoglobin: 11.4 g/dL — ABNORMAL LOW (ref 13.0–17.0)
MCH: 30.6 pg (ref 26.0–34.0)
MCHC: 30.7 g/dL (ref 30.0–36.0)
MCV: 99.5 fL (ref 78.0–100.0)
PLATELETS: 193 10*3/uL (ref 150–400)
RBC: 3.73 MIL/uL — AB (ref 4.22–5.81)
RDW: 15.5 % (ref 11.5–15.5)
WBC: 15.3 10*3/uL — AB (ref 4.0–10.5)

## 2015-06-27 LAB — GLUCOSE, CAPILLARY
GLUCOSE-CAPILLARY: 131 mg/dL — AB (ref 65–99)
GLUCOSE-CAPILLARY: 188 mg/dL — AB (ref 65–99)
GLUCOSE-CAPILLARY: 224 mg/dL — AB (ref 65–99)
Glucose-Capillary: 201 mg/dL — ABNORMAL HIGH (ref 65–99)
Glucose-Capillary: 197 mg/dL — ABNORMAL HIGH (ref 65–99)
Glucose-Capillary: 121 mg/dL — ABNORMAL HIGH (ref 65–99)

## 2015-06-27 LAB — CULTURE, RESPIRATORY W GRAM STAIN: Culture: NO GROWTH

## 2015-06-27 LAB — H. PYLORI ANTIBODY, IGG: H PYLORI IGG: 1.7 U/mL — AB (ref 0.0–0.8)

## 2015-06-27 LAB — CULTURE, RESPIRATORY

## 2015-06-27 MED ORDER — PANTOPRAZOLE SODIUM 40 MG PO PACK
40.0000 mg | PACK | Freq: Every day | ORAL | Status: DC
  Administered 2015-06-27: 40 mg
  Filled 2015-06-27: qty 20

## 2015-06-27 MED ORDER — FUROSEMIDE 10 MG/ML IJ SOLN
40.0000 mg | Freq: Two times a day (BID) | INTRAMUSCULAR | Status: DC
  Administered 2015-06-27 – 2015-06-29 (×5): 40 mg via INTRAVENOUS
  Filled 2015-06-27 (×5): qty 4

## 2015-06-27 MED ORDER — PANTOPRAZOLE SODIUM 40 MG PO PACK
40.0000 mg | PACK | Freq: Two times a day (BID) | ORAL | Status: DC
  Administered 2015-06-27 – 2015-06-28 (×2): 40 mg
  Filled 2015-06-27: qty 20

## 2015-06-27 NOTE — Care Management Important Message (Signed)
Important Message  Patient Details  Name: Shane Todd MRN: [id number] Date of Birth: 07-Oct-1945   Medicare Important Message Given:  Yes    Malcolm MetroChildress, Verlinda Slotnick Demske, RN 06/27/2015, 2:38 PM

## 2015-06-27 NOTE — Progress Notes (Signed)
Inpatient Diabetes Program Recommendations  AACE/ADA: New Consensus Statement on Inpatient Glycemic Control (2015)  Target Ranges:  Prepandial:   less than 140 mg/dL      Peak postprandial:   less than 180 mg/dL (1-2 hours)      Critically ill patients:  140 - 180 mg/dL   Review of Glycemic :  Results for Shane Todd, Kevan A (MRN [id number]) as of 06/27/2015 09:21  Ref. Range 06/26/2015 07:40 06/26/2015 11:13 06/26/2015 15:37 06/26/2015 19:30 06/26/2015 23:38 06/27/2015 03:57  Glucose-Capillary Latest Ref Range: 65-99 mg/dL 119198 (H) 147195 (H) 829207 (H) 222 (H) 212 (H) 201 (H)   Diabetes history: No history of diabetes Current orders for Inpatient glycemic control:  Novolog moderate q 4 hours Note that patient is also on Jevity 40 cc per hour and Solumedrol 60 mg q 6 hours  Inpatient Diabetes Program Recommendations:    Consider adding Novolog tube feed coverage 3 units q 4 hours to cover CHO in tube feeds while patient is on the ventilator.  Thanks,  Beryl MeagerJenny Merina Behrendt, RN, BC-ADM Inpatient Diabetes Coordinator Pager [phone] (8a-5p)

## 2015-06-27 NOTE — Progress Notes (Signed)
  Subjective:  Patient denies nausea or abdominal pain.   Objective: Blood pressure 162/70, pulse 102, temperature 99.1 F (37.3 C), temperature source Axillary, resp. rate 18, height 6\' 2"  (1.88 m), weight 160 lb 0.9 oz (72.6 kg), SpO2 99 %. Patient remains intubated but he is alert and responds appropriately to questions. Abdomen is symmetrical with normal bowel sounds. On palpation is soft and nontender without organomegaly or masses. No LE edema or clubbing noted.  Labs/studies Results:   Recent Labs  07/09/2015 2050 06/25/15 0222 06/27/15 0431  WBC 9.5 9.9 15.3*  HGB 10.4* 10.6* 11.4*  HCT 33.6* 33.9* 37.1*  PLT 192 208 193    BMET   Recent Labs  06/26/15 0440  NA 143  K 4.5  CL 110  CO2 33*  GLUCOSE 203*  BUN 31*  CREATININE 0.56*  CALCIUM 7.8*    LFT   Recent Labs  06/26/15 0440  PROT 4.8*  ALBUMIN 2.2*  AST 12*  ALT 10*  ALKPHOS 52  BILITOT 0.2*    H. pylori serology is pending  Assessment:  #1. Upper GI bleed secondary to small gastric ulcer clipped 3 days ago. No evidence of recurrent GI bleed and hemoglobin remained stable. EGD also revealed Mallory-Weiss tear without stigmata of bleed. #2. Anemia appears to be multifactorial. #3. Respiratory failure setting of COPD and continued cigarette smoking requiring intubation on admission and now patient is in the process of being weaned. #4. Nutritional status. Patient is on nasogastric feeding. Feeding will have to be stopped prior to intubation he will be started on oral feeding.   Recommendations:  DC pantoprazole infusion. Begin pantoprazole 40 mg twice a day [po or via NG tube.

## 2015-06-27 NOTE — Progress Notes (Signed)
Subjective: He remains intubated and on the ventilator. No new problems.  Objective: Vital signs in last 24 hours: Temp:  [97.9 F (36.6 C)-98.4 F (36.9 C)] 98.4 F (36.9 C) (05/15 0800) Pulse Rate:  [63-96] 80 (05/15 0445) Resp:  [5-35] 10 (05/15 0445) BP: (116-175)/(60-109) 162/70 mmHg (05/15 0445) SpO2:  [96 %-100 %] 98 % (05/15 0445) FiO2 (%):  [35 %] 35 % (05/15 0338) Weight change:     Intake/Output from previous day: 05/14 0701 - 05/15 0700 In: 4634.4 [I.V.:3237; GE/XB:2841.3; IV Piggyback:150] Out: 1600 [Urine:1600]  PHYSICAL EXAM General appearance: Intubated and sedated. On mechanical ventilation Resp: wheezes bilaterally Cardio: regular rate and rhythm, S1, S2 normal, no murmur, click, rub or gallop GI: soft, non-tender; bowel sounds normal; no masses,  no organomegaly Extremities: extremities normal, atraumatic, no cyanosis or edema  Lab Results:  Results for orders placed or performed during the hospital encounter of 06/13/2015 (from the past 48 hour(s))  Glucose, capillary     Status: Abnormal   Collection Time: 06/25/15 12:05 PM  Result Value Ref Range   Glucose-Capillary 179 (H) 65 - 99 mg/dL   Comment 1 Notify RN    Comment 2 Document in Chart   Glucose, capillary     Status: Abnormal   Collection Time: 06/25/15  4:19 PM  Result Value Ref Range   Glucose-Capillary 190 (H) 65 - 99 mg/dL   Comment 1 Notify RN    Comment 2 Document in Chart   Glucose, capillary     Status: Abnormal   Collection Time: 06/25/15  7:34 PM  Result Value Ref Range   Glucose-Capillary 191 (H) 65 - 99 mg/dL   Comment 1 Notify RN   Glucose, capillary     Status: Abnormal   Collection Time: 06/25/15 11:49 PM  Result Value Ref Range   Glucose-Capillary 169 (H) 65 - 99 mg/dL   Comment 1 Notify RN   Blood gas, arterial     Status: Abnormal   Collection Time: 06/26/15  3:28 AM  Result Value Ref Range   FIO2 0.35    Delivery systems VENTILATOR    Mode PRESSURE REGULATED VOLUME  CONTROL    VT 550 mL   LHR 12 resp/min   Peep/cpap 3.0 cm H20   pH, Arterial 7.356 7.350 - 7.450   pCO2 arterial 59.1 (HH) 35.0 - 45.0 mmHg   pO2, Arterial 89.8 80.0 - 100.0 mmHg   Bicarbonate 29.7 (H) 20.0 - 24.0 mEq/L   Acid-Base Excess 6.8 (H) 0.0 - 2.0 mmol/L   O2 Saturation 96.8 %   Collection site RIGHT RADIAL    Drawn by [id number]    Sample type ARTERIAL DRAW    Allens test (pass/fail) PASS PASS  Glucose, capillary     Status: Abnormal   Collection Time: 06/26/15  3:38 AM  Result Value Ref Range   Glucose-Capillary 199 (H) 65 - 99 mg/dL   Comment 1 Notify RN   Triglycerides     Status: None   Collection Time: 06/26/15  4:40 AM  Result Value Ref Range   Triglycerides 75 <150 mg/dL  Comprehensive metabolic panel     Status: Abnormal   Collection Time: 06/26/15  4:40 AM  Result Value Ref Range   Sodium 143 135 - 145 mmol/L   Potassium 4.5 3.5 - 5.1 mmol/L   Chloride 110 101 - 111 mmol/L   CO2 33 (H) 22 - 32 mmol/L   Glucose, Bld 203 (H) 65 - 99 mg/dL  BUN 31 (H) 6 - 20 mg/dL   Creatinine, Ser 0.56 (L) 0.61 - 1.24 mg/dL   Calcium 7.8 (L) 8.9 - 10.3 mg/dL   Total Protein 4.8 (L) 6.5 - 8.1 g/dL   Albumin 2.2 (L) 3.5 - 5.0 g/dL   AST 12 (L) 15 - 41 U/L   ALT 10 (L) 17 - 63 U/L   Alkaline Phosphatase 52 38 - 126 U/L   Total Bilirubin 0.2 (L) 0.3 - 1.2 mg/dL   GFR calc non Af Amer >60 >60 mL/min   GFR calc Af Amer >60 >60 mL/min    Comment: (NOTE) The eGFR has been calculated using the CKD EPI equation. This calculation has not been validated in all clinical situations. eGFR's persistently <60 mL/min signify possible Chronic Kidney Disease.    Anion gap 0 (L) 5 - 15  Glucose, capillary     Status: Abnormal   Collection Time: 06/26/15  7:40 AM  Result Value Ref Range   Glucose-Capillary 198 (H) 65 - 99 mg/dL   Comment 1 Notify RN    Comment 2 Document in Chart   Glucose, capillary     Status: Abnormal   Collection Time: 06/26/15 11:13 AM  Result Value Ref Range    Glucose-Capillary 195 (H) 65 - 99 mg/dL   Comment 1 Notify RN    Comment 2 Document in Chart   Glucose, capillary     Status: Abnormal   Collection Time: 06/26/15  3:37 PM  Result Value Ref Range   Glucose-Capillary 207 (H) 65 - 99 mg/dL   Comment 1 Notify RN   Glucose, capillary     Status: Abnormal   Collection Time: 06/26/15  7:30 PM  Result Value Ref Range   Glucose-Capillary 222 (H) 65 - 99 mg/dL   Comment 1 Notify RN   Glucose, capillary     Status: Abnormal   Collection Time: 06/26/15 11:38 PM  Result Value Ref Range   Glucose-Capillary 212 (H) 65 - 99 mg/dL   Comment 1 Notify RN   Glucose, capillary     Status: Abnormal   Collection Time: 06/27/15  3:57 AM  Result Value Ref Range   Glucose-Capillary 201 (H) 65 - 99 mg/dL   Comment 1 Notify RN   Blood gas, arterial     Status: Abnormal   Collection Time: 06/27/15  4:15 AM  Result Value Ref Range   FIO2 0.35    Delivery systems VENTILATOR    Mode PRESSURE REGULATED VOLUME CONTROL    VT 550 mL   LHR 12 resp/min   Peep/cpap 3.0 cm H20   pH, Arterial 7.325 (L) 7.350 - 7.450   pCO2 arterial 65.3 (HH) 35.0 - 45.0 mmHg    Comment: CRITICAL RESULT CALLED TO, READ BACK BY AND VERIFIED WITH:  TO WAGONER R RN AT 0425 06/27/2015 BY [facility] RRT    pO2, Arterial 98.5 80.0 - 100.0 mmHg   Bicarbonate 29.2 (H) 20.0 - 24.0 mEq/L   Acid-Base Excess 7.2 (H) 0.0 - 2.0 mmol/L   O2 Saturation 97.1 %   Collection site BRACHIAL ARTERY    Drawn by [id number]    Sample type ARTERIAL    Allens test (pass/fail) NOT INDICATED (A) PASS  CBC     Status: Abnormal   Collection Time: 06/27/15  4:31 AM  Result Value Ref Range   WBC 15.3 (H) 4.0 - 10.5 K/uL   RBC 3.73 (L) 4.22 - 5.81 MIL/uL   Hemoglobin 11.4 (L) 13.0 - 17.0  g/dL   HCT 37.1 (L) 39.0 - 52.0 %   MCV 99.5 78.0 - 100.0 fL   MCH 30.6 26.0 - 34.0 pg   MCHC 30.7 30.0 - 36.0 g/dL   RDW 15.5 11.5 - 15.5 %   Platelets 193 150 - 400 K/uL  Glucose, capillary     Status: Abnormal   Collection  Time: 06/27/15  7:39 AM  Result Value Ref Range   Glucose-Capillary 197 (H) 65 - 99 mg/dL   Comment 1 Notify RN    Comment 2 Document in Chart     ABGS  Recent Labs  06/27/15 0415  PHART 7.325*  PO2ART 98.5  HCO3 29.2*   CULTURES Recent Results (from the past 240 hour(s))  MRSA PCR Screening     Status: None   Collection Time: 06/21/2015  2:10 PM  Result Value Ref Range Status   MRSA by PCR NEGATIVE NEGATIVE Final    Comment:        The GeneXpert MRSA Assay (FDA approved for NASAL specimens only), is one component of a comprehensive MRSA colonization surveillance program. It is not intended to diagnose MRSA infection nor to guide or monitor treatment for MRSA infections.   Culture, respiratory (NON-Expectorated)     Status: None (Preliminary result)   Collection Time: 06/14/2015  4:00 PM  Result Value Ref Range Status   Specimen Description TRACHEAL ASPIRATE  Final   Special Requests Immunocompromised  Final   Gram Stain   Final    MODERATE WBC PRESENT, PREDOMINANTLY PMN NO SQUAMOUS EPITHELIAL CELLS SEEN NO ORGANISMS SEEN Performed at [facility]    Culture   Final    NO GROWTH 1 DAY Performed at [facility]    Report Status PENDING  Incomplete   Studies/Results: Dg Chest Port 1 View  06/26/2015  CLINICAL DATA:  Respiratory failure EXAM: PORTABLE CHEST 1 VIEW COMPARISON:  06/25/2015 FINDINGS: Endotracheal tube in satisfactory position. NG tube noted with the tip not visualized. COPD with apical scarring and hyperinflation. Negative for heart failure. Small left effusion. Negative for pneumonia. IMPRESSION: Endotracheal tube in good position COPD with scarring Small left effusion Electronically Signed   By: Franchot Gallo M.D.   On: 06/26/2015 10:49    Medications:  Prior to Admission:  Prescriptions prior to admission  Medication Sig Dispense Refill Last Dose  . albuterol (PROVENTIL HFA;VENTOLIN HFA) 108 (90 BASE) MCG/ACT inhaler Inhale 2 puffs  into the lungs every 4 (four) hours as needed for wheezing or shortness of breath. For shortness of breath   06/15/2015 at Unknown time  . amLODipine (NORVASC) 5 MG tablet Take 1 tablet by mouth daily.   06/21/2015 at Unknown time  . DULERA 100-5 MCG/ACT AERO Inhale 2 puffs into the lungs 2 (two) times daily.   06/27/2015 at Unknown time  . lisinopril (PRINIVIL,ZESTRIL) 20 MG tablet Take 20 mg by mouth every morning.    06/21/2015 at Unknown time  . traZODone (DESYREL) 100 MG tablet Take 0.5-1 tablets (50-100 mg total) by mouth at bedtime. 30 tablet 1 06/21/2015 at Unknown time  . feeding supplement, ENSURE ENLIVE, (ENSURE ENLIVE) LIQD Take 237 mLs by mouth 2 (two) times daily between meals. (Patient not taking: Reported on 06/13/2015)     . ipratropium-albuterol (DUONEB) 0.5-2.5 (3) MG/3ML SOLN Take 3 mLs by nebulization 2 (two) times daily. (Patient not taking: Reported on 07/07/2015)     . nicotine (NICODERM CQ - DOSED IN MG/24 HOURS) 21 mg/24hr patch Place 1 patch (  21 mg total) onto the skin daily. (Patient not taking: Reported on 06/17/2015)     . predniSONE (DELTASONE) 10 MG tablet Take with breakfast daily for 2 more days. (Patient not taking: Reported on 06/21/2015)     . risperiDONE (RISPERDAL) 0.25 MG tablet Take 1 tablet (0.25 mg total) by mouth at bedtime. (Patient not taking: Reported on 06/19/2015) 30 tablet 1   . senna-docusate (SENOKOT-S) 8.6-50 MG tablet Take 1 tablet by mouth 2 (two) times daily. (Patient not taking: Reported on 06/18/2015)      Scheduled: . antiseptic oral rinse  7 mL Mouth Rinse QID  . chlorhexidine gluconate (SAGE KIT)  15 mL Mouth Rinse BID  . insulin aspart  0-15 Units Subcutaneous Q4H  . ipratropium-albuterol  3 mL Nebulization Q6H  . levofloxacin (LEVAQUIN) IV  750 mg Intravenous Q24H  . methylPREDNISolone (SOLU-MEDROL) injection  60 mg Intravenous Q6H  . pantoprazole (PROTONIX) IV  40 mg Intravenous Q12H  . sodium chloride flush  3 mL Intravenous Q12H    Continuous: . dextrose 5 % and 0.9% NaCl 100 mL/hr at 06/27/15 0015  . feeding supplement (JEVITY 1.2 CAL) 1,000 mL (06/26/15 1500)  . pantoprozole (PROTONIX) infusion 8 mg/hr (06/27/15 0125)  . propofol (DIPRIVAN) infusion 60 mcg/kg/min (06/27/15 0450)   [facility]:VGWGYFEET, fentaNYL (SUBLIMAZE) injection, ondansetron **OR** ondansetron (ZOFRAN) IV  Assesment: He has acute on chronic hypercapnic and hypoxic respiratory failure. He has protein calorie malnutrition. He has significant third spacing of fluid. His family has decided on DO NOT RESUSCITATE status which I think is appropriate. HEENT they have also decided not to reintubate him if we get him off which I  think is appropriate Principal Problem:   Acute respiratory failure with hypercapnia ([place]) Active Problems:   COPD with exacerbation ([place])   Tobacco abuse   Essential hypertension   Protein calorie malnutrition (HCC)   Encephalopathy, metabolic   Acute on chronic respiratory failure ([place])    Plan: Continue current treatments.    LOS: 5 days   Jonothan Heberle L 06/27/2015, 8:09 AM

## 2015-06-27 NOTE — Progress Notes (Signed)
Had been off sedation since 1100 this morning. Placed back on propofol at 60 mcq. As of this note to rest for tonight. Will tell night shift to stop sedation at 0500 for weaning and possible extubation tomorrow.

## 2015-06-27 NOTE — Progress Notes (Signed)
[facility] PROGRESS NOTE  Shane Todd DOB: March 29, 1945    PCP:   [name], MD   HPI: Shane Todd is an 70 y.o. male with hx of severe COPD, continued smoker, dementia, refusal to wear Bipap at home, admitted with respiratory failure, hypoxic and hypercapnic and was intubated. His baseline pCO2 is likely chronically elevated, after ventilatory adjustment, his ABG is more stabalized with pH 7.34 and pCO2 in the 55 range. He had blood in this OG tube. and EGD by Dr Renae Fickle showed Gastric ulcer and non bleeding MW tear. He was clipped. He was started on TF. I had updated his family about his condition daily. His wife is also thinking about palliative care consultation next week as well. He is still not able to be extubated, and now family has strongly considering comfort care only.      Past Medical History  Diagnosis Date  . COPD (chronic obstructive pulmonary disease) (HCC)   . Hypertension   . Tobacco abuse   . Anxiety   . Anemia 04/24/2011  . Pneumonia     hospitalized 04/2011  . Shortness of breath     with exertion  . GERD (gastroesophageal reflux disease)   . Constipation   . Eczema   . Kidney stones   . Dementia with behavioral disturbance 04/18/2015  . Acute respiratory failure with hypoxia and hypercarbia (HCC) 07/02/2013  . COPD exacerbation (HCC) 04/05/2015    Past Surgical History  Procedure Laterality Date  . Hemorrhoid surgery    . Colonoscopy    . Inguinal hernia repair Left 06/06/2012    Procedure: HERNIA REPAIR INGUINAL ADULT;  Surgeon: Shelly Rubenstein, MD;  Location: [facility] OR;  Service: General;  Laterality: Left;  . Insertion of mesh Left 06/06/2012    Procedure: INSERTION OF MESH;  Surgeon: Shelly Rubenstein, MD;  Location: [facility] OR;  Service: General;  Laterality: Left;  . Hernia repair Left 06/06/2012    Medications:  HOME MEDS: Prior to Admission medications   Medication Sig Start Date End Date Taking?  Authorizing Provider  albuterol (PROVENTIL HFA;VENTOLIN HFA) 108 (90 BASE) MCG/ACT inhaler Inhale 2 puffs into the lungs every 4 (four) hours as needed for wheezing or shortness of breath. For shortness of breath   Yes Historical Provider, MD  amLODipine (NORVASC) 5 MG tablet Take 1 tablet by mouth daily. 03/22/15  Yes Historical Provider, MD  DULERA 100-5 MCG/ACT AERO Inhale 2 puffs into the lungs 2 (two) times daily. 05/10/15  Yes Historical Provider, MD  lisinopril (PRINIVIL,ZESTRIL) 20 MG tablet Take 20 mg by mouth every morning.    Yes Historical Provider, MD  traZODone (DESYREL) 100 MG tablet Take 0.5-1 tablets (50-100 mg total) by mouth at bedtime. 04/18/15  Yes Elliot Cousin, MD  feeding supplement, ENSURE ENLIVE, (ENSURE ENLIVE) LIQD Take 237 mLs by mouth 2 (two) times daily between meals. Patient not taking: Reported on 07/11/15 04/18/15   Elliot Cousin, MD  ipratropium-albuterol (DUONEB) 0.5-2.5 (3) MG/3ML SOLN Take 3 mLs by nebulization 2 (two) times daily. Patient not taking: Reported on 07-11-15 04/18/15   Elliot Cousin, MD  nicotine (NICODERM CQ - DOSED IN MG/24 HOURS) 21 mg/24hr patch Place 1 patch (21 mg total) onto the skin daily. Patient not taking: Reported on 11-Jul-2015 04/18/15   Elliot Cousin, MD  predniSONE (DELTASONE) 10 MG tablet Take with breakfast daily for 2 more days. Patient not taking: Reported on 07/11/15 04/18/15   Elliot Cousin, MD  risperiDONE [facility])  0.25 MG tablet Take 1 tablet (0.25 mg total) by mouth at bedtime. Patient not taking: Reported on 06/19/2015 04/18/15   Elliot Cousin, MD  senna-docusate (SENOKOT-S) 8.6-50 MG tablet Take 1 tablet by mouth 2 (two) times daily. Patient not taking: Reported on 07/04/2015 04/18/15   Elliot Cousin, MD     Allergies:  Allergies  Allergen Reactions  . Oxycodone Nausea And Vomiting    Social History:   reports that he has been smoking.  He does not have any smokeless tobacco history on file. He reports that he does not drink  alcohol or use illicit drugs.  Family History: Family History  Problem Relation Age of Onset  . Stroke Mother   . Deep vein thrombosis Father   . Lung cancer Sister   . Stroke Sister   . Hypertension Sister   . Heart disease Brother   . Stroke Brother   . Stroke Sister   . Heart disease Sister      Physical Exam: Filed Vitals:   06/27/15 0901 06/27/15 1115 06/27/15 1146 06/27/15 1155  BP:      Pulse: 81 102  102  Temp:   99.1 F (37.3 C)   TempSrc:   Axillary   Resp: Height:      Weight:      SpO2: 100% 99%  99%   Blood pressure 162/70, pulse 102, temperature 99.1 F (37.3 C), temperature source Axillary, resp. rate 18, height  (1.88 m), weight 72.6 kg (160 lb 0.9 oz), SpO2 99 %.  GEN:  Pleasant patient lying in the stretcher in no acute distress; cooperative with exam. PSYCH:  alert and oriented x4; does not appear anxious or depressed; affect is appropriate. HEENT: Mucous membranes pink and anicteric; PERRLA; EOM intact; no cervical lymphadenopathy nor thyromegaly or carotid bruit; no JVD; There were no stridor. Neck is very supple. Breasts:: Not examined CHEST WALL: No tenderness CHEST: Normal respiration, clear to auscultation bilaterally. Mechanically ventilated. HEART: Regular rate and rhythm.  There are no murmur, rub, or gallops.   BACK: No kyphosis or scoliosis; no CVA tenderness ABDOMEN: soft and non-tender; no masses, no organomegaly, normal abdominal bowel sounds; no pannus; no intertriginous candida. There is no rebound and no distention. Rectal Exam: Not done EXTREMITIES: No bone or joint deformity; age-appropriate arthropathy of the hands and knees; no edema; no ulcerations.  There is no calf tenderness. Genitalia: not examined PULSES: 2+ and symmetric SKIN: Normal hydration no rash or ulceration CNS: intubated and sedated.    Labs on Admission:  Basic Metabolic Panel:  Recent Labs Lab 06/30/2015 0726 06/23/15 0430 06/26/15 0440   NA 142 142 143  K 4.2 3.3* 4.5  CL 96* 106 110  CO2 34* 30 33*  GLUCOSE 149* 216* 203*  BUN 48* 44* 31*  CREATININE 0.84 0.79 0.56*  CALCIUM 9.4 8.4* 7.8*   Liver Function Tests:  Recent Labs Lab 06/26/15 0440  AST 12*  ALT 10*  ALKPHOS 52  BILITOT 0.2*  PROT 4.8*  ALBUMIN 2.2*   CBC:  Recent Labs Lab 06/17/2015 0726  2015/07/14 0858 July 14, 2015 1412 July 14, 2015 2050 06/25/15 0222 06/27/15 0431  WBC 8.2  < > 10.1 11.5* 9.5 9.9 15.3*  NEUTROABS 6.4  --   --   --   --   --   --   HGB 12.4*  < > 11.4* 11.6* 10.4* 10.6* 11.4*  HCT 39.9  < > 36.2* 37.2* 33.6* 33.9* 37.1*  MCV  97.8  < > 97.6 97.9 98.0 98.0 99.5  PLT 220  < > 218 221 192 208 193  < > = values in this interval not displayed. Cardiac Enzymes:  Recent Labs Lab 05-26-15 0726  TROPONINI <0.03    CBG:  Recent Labs Lab 06/26/15 1930 06/26/15 2338 06/27/15 0357 06/27/15 0739 06/27/15 1129  GLUCAP 222* 212* 201* 197* 188*     Radiological Exams on Admission: Dg Chest Port 1 View  06/26/2015  CLINICAL DATA:  Respiratory failure EXAM: PORTABLE CHEST 1 VIEW COMPARISON:  06/25/2015 FINDINGS: Endotracheal tube in satisfactory position. NG tube noted with the tip not visualized. COPD with apical scarring and hyperinflation. Negative for heart failure. Small left effusion. Negative for pneumonia. IMPRESSION: Endotracheal tube in good position COPD with scarring Small left effusion Electronically Signed   By: Marlan Palauharles  Clark M.D.   On: 06/26/2015 10:49   Assessment/Plan Present on Admission:  . Acute respiratory failure with hypercapnia (HCC) . COPD with exacerbation (HCC) . Encephalopathy, metabolic . Protein calorie malnutrition (HCC) . Tobacco abuse . Essential hypertension . Acute on chronic respiratory failure (HCC)  PLAN:  Acute respiratory failure:  DNR, and awaiting palliative consultation.  We will likely d/c further treatment after family meeting with Shane Todd of Palliative care medicine.     UGIB:  No further bleeding.  On TF and PPI at this time.    Other plans as per orders. Code Status: DNR/DNI.    Houston SirenLE,Kendalynn Wideman, MD.  FACP [facility] Pager [phone] 7pm to 7am.  06/27/2015, 12:49 PM

## 2015-06-27 NOTE — Consult Note (Signed)
Consultation Note Date: 06/27/2015   Patient Name: Shane Todd  DOB: May 19, 1945  MRN: [id number]  Age / Sex: 70 y.o., male  PCP: Shane Macadam, MD Referring Physician: Orvan Falconer, MD  Reason for Consultation: Establishing goals of care, Psychosocial/spiritual support and Withdrawal of life-sustaining treatment  HPI/Patient Profile: 70 y.o. male  with past medical history of Severe COPD with continued cigarette smoking, hypertension, anxiety, vascular dementia, refusal to wear BiPAP at home, recent admit for respiratory distress admitted on 06/16/2015 with acute on chronic respiratory failure due to COPD exacerbation.   Clinical Assessment and Goals of Care: Shane Todd is resting quietly in bed, he is sedated intubated/ventilated. His family is at bedside. We come to my office to have a family meeting. Present today are wife Shane Todd, brother Shane Todd me, brother and sister-in-law Shane Todd, son Shane Todd, daughter Shane Todd arrived later.  We talk about Shane Todd's chronic health history and current medical illness. We review his labs and his treatment plan. We discuss healthcare power of attorney and Shane Todd active's. Shane Todd shares that Shane Todd completed advanced directives in 1999. Family talks about his recent declines including a recent hospitalization for 12 days, weight loss, decreased mobility.  We talk about the chronic illness trajectory, and how this is affected by dementia. We talk about the dementia trajectory including decreased mobility, decreased intake, and risk for infection/decline. They share that he was not seen a neurologist because he did not want to. I talk about Shane Todd giving them clues about what he did and did not want during this time (not wearing BiPAP, not seeing neurologist).  Shane Todd shares that he told her that, "he knew he was dying".  They also share that he would never want  to be a resident of a skilled nursing facility, "he would rather be dead".    We talk about his ventilator dependent status. They share that he weaned yesterday for 2 hours, but he has not weaned today. We talk with respiratory therapy, and she mentions his thick secretions as an impediment to extubation.  We talk about compassionate extubation, versus continued ventilation. We talk about what each would look like. Family wants to see if Shane Todd is able to wean more today, but they are nearing a decision for terminal extubation. They talk about having another brother, Shane Todd, be present during this time.  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  HCPOA, Shane Todd is unable to make health care decisions at this time. His wife, Shane Todd, is healthcare power of attorney. She shares that he completed advanced directives in 1999.   SUMMARY OF RECOMMENDATIONS   considering compassionate extubation, today but more likely tomorrow.  Code Status/Advance Care Planning:  DNR, family is considering compassionate extubation.  Symptom Management:   per Dr. Luan Todd and hospitalist  Palliative Prophylaxis:   Aspiration, Frequent Pain Assessment, Oral Care and Turn Reposition  Additional Recommendations (Limitations, Scope, Preferences):  Family  is considering compassionate extubation at this time. Family preparing.  Psycho-social/Spiritual:   Desire for further Chaplaincy support:yes  Additional Recommendations: Compassionate Wean Education and Grief/Bereavement Support  Prognosis:   < 2 weeks, likely based on poor weaning from ventilator, increased white blood cells, and families desire to extubated with no re-intubation.   Discharge Planning: Anticipated Hospital Death      Primary Diagnoses: Present on Admission:  . Acute respiratory failure with hypercapnia ([place]) . COPD with exacerbation  ([place]) . Encephalopathy, metabolic . Protein calorie malnutrition ([place]) . Tobacco abuse . Essential hypertension . Acute on chronic respiratory failure ([place])  I have reviewed the medical record, interviewed the patient and family, and examined the patient. The following aspects are pertinent.  Past Medical History  Diagnosis Date  . COPD (chronic obstructive pulmonary disease) ([place])   . Hypertension   . Tobacco abuse   . Anxiety   . Anemia 04/24/2011  . Pneumonia     hospitalized 04/2011  . Shortness of breath     with exertion  . GERD (gastroesophageal reflux disease)   . Constipation   . Eczema   . Kidney stones   . Dementia with behavioral disturbance 04/18/2015  . Acute respiratory failure with hypoxia and hypercarbia ([place]) 07/02/2013  . COPD exacerbation ([place]) 04/05/2015   Social History   Social History  . Marital Status: Married    Spouse Name: N/A  . Number of Children: N/A  . Years of Education: N/A   Social History Main Topics  . Smoking status: Current Every Day Smoker -- 0.50 packs/day    Last Attempt to Quit: 05/22/2011  . Smokeless tobacco: None     Comment: 1 pack every 2 days x 44 yrs.   . Alcohol Use: No  . Drug Use: No  . Sexual Activity: Yes   Other Topics Concern  . None   Social History Narrative   Family History  Problem Relation Age of Onset  . Stroke Mother   . Deep vein thrombosis Father   . Lung cancer Sister   . Stroke Sister   . Hypertension Sister   . Heart disease Brother   . Stroke Brother   . Stroke Sister   . Heart disease Sister    Scheduled Meds: . antiseptic oral rinse  7 mL Mouth Rinse QID  . chlorhexidine gluconate (SAGE KIT)  15 mL Mouth Rinse BID  . furosemide  40 mg Intravenous Q12H  . insulin aspart  0-15 Units Subcutaneous Q4H  . ipratropium-albuterol  3 mL Nebulization Q6H  . levofloxacin (LEVAQUIN) IV  750 mg Intravenous Q24H  . methylPREDNISolone (SOLU-MEDROL) injection  60 mg Intravenous Q6H  . pantoprazole  sodium  40 mg Per Tube Daily  . sodium chloride flush  3 mL Intravenous Q12H   Continuous Infusions: . dextrose 5 % and 0.9% NaCl 100 mL/hr at 06/27/15 1033  . feeding supplement (JEVITY 1.2 CAL) 1,000 mL (06/26/15 1500)  . propofol (DIPRIVAN) infusion 30 mcg/kg/min (06/27/15 1033)   PRN Meds:.albuterol, fentaNYL (SUBLIMAZE) injection, ondansetron **OR** ondansetron (ZOFRAN) IV Medications Prior to Admission:  Prior to Admission medications   Medication Sig Start Date End Date Taking? Authorizing Provider  albuterol (PROVENTIL HFA;VENTOLIN HFA) 108 (90 BASE) MCG/ACT inhaler Inhale 2 puffs into the lungs every 4 (four) hours as needed for wheezing or shortness of breath. For shortness of breath   Yes Historical Provider, MD  amLODipine (NORVASC) 5 MG tablet Take 1 tablet by mouth daily. 03/22/15  Yes Historical  Provider, MD  DULERA 100-5 MCG/ACT AERO Inhale 2 puffs into the lungs 2 (two) times daily. 05/10/15  Yes Historical Provider, MD  lisinopril (PRINIVIL,ZESTRIL) 20 MG tablet Take 20 mg by mouth every morning.    Yes Historical Provider, MD  traZODone (DESYREL) 100 MG tablet Take 0.5-1 tablets (50-100 mg total) by mouth at bedtime. 04/18/15  Yes Rexene Alberts, MD  feeding supplement, ENSURE ENLIVE, (ENSURE ENLIVE) LIQD Take 237 mLs by mouth 2 (two) times daily between meals. Patient not taking: Reported on 06/17/2015 04/18/15   Rexene Alberts, MD  ipratropium-albuterol (DUONEB) 0.5-2.5 (3) MG/3ML SOLN Take 3 mLs by nebulization 2 (two) times daily. Patient not taking: Reported on 07/03/2015 04/18/15   Rexene Alberts, MD  nicotine (NICODERM CQ - DOSED IN MG/24 HOURS) 21 mg/24hr patch Place 1 patch (21 mg total) onto the skin daily. Patient not taking: Reported on 06/23/2015 04/18/15   Rexene Alberts, MD  predniSONE (DELTASONE) 10 MG tablet Take with breakfast daily for 2 more days. Patient not taking: Reported on 06/23/2015 04/18/15   Rexene Alberts, MD  risperiDONE (RISPERDAL) 0.25 MG tablet Take 1 tablet  (0.25 mg total) by mouth at bedtime. Patient not taking: Reported on 07/06/2015 04/18/15   Rexene Alberts, MD  senna-docusate (SENOKOT-S) 8.6-50 MG tablet Take 1 tablet by mouth 2 (two) times daily. Patient not taking: Reported on 07/08/2015 04/18/15   Rexene Alberts, MD   Allergies  Allergen Reactions  . Oxycodone Nausea And Vomiting   Review of Systems  Unable to perform ROS: Intubated    Physical Exam  Constitutional: No distress.  HENT:  Head: Normocephalic and atraumatic.  Cardiovascular: Normal rate and regular rhythm.   Pulmonary/Chest:  Intubated/ventilated, not weaning at this time.  Abdominal: Soft. He exhibits no distension.  Neurological:  Sedated/ventilated  Skin: Skin is warm and dry.  3rd spacing, swollen bilateral upper extremities.  Nursing note and vitals reviewed.   Vital Signs: BP 162/70 mmHg  Pulse 102  Temp(Src) 99.1 F (37.3 C) (Axillary)  Resp 18  Ht 6' 2" (1.88 m)  Wt 72.6 kg (160 lb 0.9 oz)  BMI 20.54 kg/m2  SpO2 99% Pain Assessment: CPOT   Pain Score: 0-No pain   SpO2: SpO2: 99 % O2 Device:SpO2: 99 % O2 Flow Rate: .O2 Flow Rate (L/min): 5 L/min  IO: Intake/output summary:  Intake/Output Summary (Last 24 hours) at 06/27/15 1247 Last data filed at 06/27/15 0500  Gross per 24 hour  Intake 4105.69 ml  Output   1600 ml  Net 2505.69 ml    LBM:   Baseline Weight: Weight: 63.504 kg (140 lb) Most recent weight: Weight: 72.6 kg (160 lb 0.9 oz)     Palliative Assessment/Data:   Flowsheet Rows        Most Recent Value   Intake Tab    Referral Department  Hospitalist   Unit at Time of Referral  ICU   Palliative Care Primary Diagnosis  Pulmonary   Date Notified  06/26/15   Palliative Care Type  New Palliative care   Reason for referral  Psychosocial or Spiritual support   Date of Admission  06/29/2015   Date first seen by Palliative Care  06/27/15   # of days Palliative referral response time  1 Day(s)   # of days IP prior to Palliative  referral  4   Clinical Assessment    Palliative Performance Scale Score  10%   Pain Max last 24 hours  Not able to report   Pain  Min Last 24 hours  Not able to report   Dyspnea Max Last 24 Hours  Not able to report   Dyspnea Min Last 24 hours  Not able to report   Psychosocial & Spiritual Assessment    Palliative Care Outcomes    Patient/Family meeting held?  Yes   Who was at the meeting?  wife, Shane Todd, brothers Konrad Dolores and Francee Piccolo Zigmund Daniel), daughter Shane Todd, son Shane Todd.    Palliative Care Outcomes  Clarified goals of care, Provided advance care planning, Provided psychosocial or spiritual support   Patient/Family wishes: Interventions discontinued/not started   PEG   Palliative Care follow-up planned  -- [follow up at [place]      Time In: 1010 Time Out: 1140 Time Total: 90 minutes Greater than 50%  of this time was spent counseling and coordinating care related to the above assessment and plan.  Signed by: Drue Novel, NP   Please contact Palliative Medicine Team phone at [phone] for questions and concerns.  For individual provider: See Shea Evans

## 2015-06-28 ENCOUNTER — Encounter (HOSPITAL_COMMUNITY): Payer: Self-pay | Admitting: Internal Medicine

## 2015-06-28 DIAGNOSIS — R140 Abdominal distension (gaseous): Secondary | ICD-10-CM

## 2015-06-28 DIAGNOSIS — R0600 Dyspnea, unspecified: Secondary | ICD-10-CM

## 2015-06-28 LAB — BLOOD GAS, ARTERIAL
Acid-Base Excess: 13.6 mmol/L — ABNORMAL HIGH (ref 0.0–2.0)
Bicarbonate: 36 mEq/L — ABNORMAL HIGH (ref 20.0–24.0)
Drawn by: 317771
FIO2: 0.35
MECHVT: 550 mL
O2 Content: 35 L/min
O2 SAT: 98.9 %
PCO2 ART: 63.1 mmHg — AB (ref 35.0–45.0)
PEEP: 5 cmH2O
PH ART: 7.407 (ref 7.350–7.450)
PO2 ART: 141 mmHg — AB (ref 80.0–100.0)
RATE: 12 resp/min
TCO2: 16.2 mmol/L (ref 0–100)

## 2015-06-28 LAB — GLUCOSE, CAPILLARY
GLUCOSE-CAPILLARY: 199 mg/dL — AB (ref 65–99)
Glucose-Capillary: 223 mg/dL — ABNORMAL HIGH (ref 65–99)
Glucose-Capillary: 121 mg/dL — ABNORMAL HIGH (ref 65–99)

## 2015-06-28 MED ORDER — SCOPOLAMINE 1 MG/3DAYS TD PT72
1.0000 | MEDICATED_PATCH | TRANSDERMAL | Status: DC
  Administered 2015-06-28: 1.5 mg via TRANSDERMAL
  Filled 2015-06-28: qty 1

## 2015-06-28 MED ORDER — MORPHINE SULFATE (PF) 2 MG/ML IV SOLN
2.0000 mg | INTRAVENOUS | Status: DC | PRN
  Administered 2015-06-28 (×3): 4 mg via INTRAVENOUS
  Administered 2015-06-28: 2 mg via INTRAVENOUS
  Administered 2015-06-29 (×3): 4 mg via INTRAVENOUS
  Administered 2015-06-29: 2 mg via INTRAVENOUS
  Filled 2015-06-28 (×3): qty 2
  Filled 2015-06-28: qty 1
  Filled 2015-06-28 (×2): qty 2
  Filled 2015-06-28: qty 1
  Filled 2015-06-28: qty 2

## 2015-06-28 MED ORDER — LORAZEPAM 2 MG/ML IJ SOLN
1.0000 mg | Freq: Once | INTRAMUSCULAR | Status: AC
  Administered 2015-06-28: 1 mg via INTRAVENOUS
  Filled 2015-06-28: qty 1

## 2015-06-28 MED ORDER — HALOPERIDOL LACTATE 5 MG/ML IJ SOLN
5.0000 mg | Freq: Once | INTRAMUSCULAR | Status: AC
  Administered 2015-06-28: 5 mg via INTRAVENOUS
  Filled 2015-06-28: qty 1

## 2015-06-28 MED ORDER — MORPHINE SULFATE (CONCENTRATE) 10 MG/0.5ML PO SOLN
2.5000 mg | ORAL | Status: DC | PRN
  Administered 2015-06-28 – 2015-06-29 (×3): 2.6 mg via ORAL
  Filled 2015-06-28 (×3): qty 0.5

## 2015-06-28 MED ORDER — SCOPOLAMINE 1 MG/3DAYS TD PT72
MEDICATED_PATCH | TRANSDERMAL | Status: AC
  Filled 2015-06-28: qty 1

## 2015-06-28 NOTE — Plan of Care (Signed)
Problem: Nutrition: Goal: Adequate nutrition will be maintained Outcome: Progressing PT ON JEVITY OG TUBE FEEDING AT 40CC/HR-GOAL RATE AND TOLERATING WELL.

## 2015-06-28 NOTE — Progress Notes (Signed)
Daily Progress Note   Patient Name: Shane Todd       Date: 06/28/2015 DOB: 06-07-1945  Age: 70 y.o. MRN#: [id number] Attending Physician: Orvan Falconer, MD Primary Care Physician: Rocky Morel, MD Admit Date: 06/21/2015  Reason for Consultation/Follow-up: Establishing goals of care, Non pain symptom management and Psychosocial/spiritual support  Subjective: Shane Todd is sitting up in bed with his family around him. He is able to make eye contact with me and answer questions. He is able to make full sentences, with pauses after every few words. We talk about his breathing, and he agrees to try oral morphine to reduce his work of breathing. I share that this may make him sleepy and he shares that it's okay with him if he falls asleep because of the medication.  We also discussed the side effect of nausea/vomiting. He has a breathing treatment on during part of our discussion, and seems relieved when the mask is removed. We talk about whether he would want the CPAP to help if his breathing were to worsen. He shares that he would not want to have the CPAP again even if it meant he were to die. Family, including wife, son and daughter are at bedside and hear his wishes.  He shares that he would rather have a few good days than have more time with bad days.  Shane Todd states he would like some Coke to drink, he is given some without overt signs of aspiration. He does have a weak cough effort, but is able to move some secretions. Son [place] asks for the positives about Shane Todd's condition, stating that he's off the ventilator I share with him my concern that respiratory therapy was able to remove secretions via suctioning, and my worry that Shane Todd is not able to cough up/out his  secretions. I talk with Mr. average brother Konrad Dolores in the waiting area about the use of oral morphine to ease Shane Todd's breathing.  I share my worry that he will become disconnected, and panic being unable to breathe. I share the benefits of morphine in reducing the feelings of air hunger and giving an overall sense of peace. I share that we are continuing with curative treatments, and that the next 24 to 48 hours will show if Shane Todd is able to improve  or not.    Length of Stay: 6  Current Medications: Scheduled Meds:  . antiseptic oral rinse  7 mL Mouth Rinse QID  . chlorhexidine gluconate (SAGE KIT)  15 mL Mouth Rinse BID  . furosemide  40 mg Intravenous Q12H  . ipratropium-albuterol  3 mL Nebulization Q6H  . levofloxacin (LEVAQUIN) IV  750 mg Intravenous Q24H  . methylPREDNISolone (SOLU-MEDROL) injection  60 mg Intravenous Q6H  . pantoprazole sodium  40 mg Per Tube BID  . sodium chloride flush  3 mL Intravenous Q12H    Continuous Infusions: . dextrose 5 % and 0.9% NaCl 100 mL/hr at 06/28/15 1400    PRN Meds: albuterol, morphine CONCENTRATE, ondansetron **OR** ondansetron (ZOFRAN) IV  Physical Exam  Constitutional:  Work of breathing noted, makes but does not keep eye contact   HENT:  Head: Normocephalic and atraumatic.  Cardiovascular:  Heart rate 90s to low 100s  Pulmonary/Chest:  Respiratory rate low to mid 20s, work of breathing noted, saturation mid to upper 90s, nonproductive weak cough, rhonchi throughout  Abdominal: Soft. He exhibits no distension.  Neurological: He is alert.  Skin: Skin is warm and dry.  Nursing note and vitals reviewed.           Vital Signs: BP 184/103 mmHg  Pulse 98  Temp(Src) 97.8 F (36.6 C) (Axillary)  Resp 23  Ht 6\' 2"  (1.88 m)  Wt 72.5 kg (159 lb 13.3 oz)  BMI 20.51 kg/m2  SpO2 100% SpO2: SpO2: 100 % O2 Device: O2 Device: Nasal Cannula O2 Flow Rate: O2 Flow Rate (L/min): 3 L/min  Intake/output summary:  Intake/Output  Summary (Last 24 hours) at 06/28/15 1540 Last data filed at 06/28/15 1400  Gross per 24 hour  Intake 3317.5 ml  Output   4650 ml  Net -1332.5 ml   LBM: Last BM Date: 06/26/15 Baseline Weight: Weight: 63.504 kg (140 lb) Most recent weight: Weight: 72.5 kg (159 lb 13.3 oz)       Palliative Assessment/Data:    Flowsheet Rows        Most Recent Value   Intake Tab    Referral Department  Hospitalist   Unit at Time of Referral  ICU   Palliative Care Primary Diagnosis  Pulmonary   Date Notified  06/26/15   Palliative Care Type  New Palliative care   Reason for referral  Psychosocial or Spiritual support   Date of Admission  07/11/2015   Date first seen by Palliative Care  06/27/15   # of days Palliative referral response time  1 Day(s)   # of days IP prior to Palliative referral  4   Clinical Assessment    Palliative Performance Scale Score  10%   Pain Max last 24 hours  Not able to report   Pain Min Last 24 hours  Not able to report   Dyspnea Max Last 24 Hours  Not able to report   Dyspnea Min Last 24 hours  Not able to report   Psychosocial & Spiritual Assessment    Palliative Care Outcomes    Patient/Family meeting held?  Yes   Who was at the meeting?  wife, 06/29/15, brothers Debarah Crape and Orvilla Fus Fredrik [place]ove), daughter Joyce Gross, son Lupita Leash.    Palliative Care Outcomes  Clarified goals of care, Provided advance care planning, Provided psychosocial or spiritual support   Patient/Family wishes: Interventions discontinued/not started   PEG   Palliative Care follow-up planned  -- [follow up at [facility]]      Patient  Active Problem List   Diagnosis Date Noted  . Palliative care encounter   . DNR (do not resuscitate) discussion   . Acute on chronic respiratory failure ([place]) 06/19/2015  . Dementia with behavioral disturbance 04/18/2015  . Protein-calorie malnutrition, severe 04/15/2015  . Encephalopathy, metabolic 16/11/9602  . Hypercapnic respiratory failure ([place]) 04/05/2015  . COPD exacerbation  ([place]) 04/05/2015  . Acute respiratory failure with hypercapnia ([place])   . Dyspnea 07/02/2013  . Acute respiratory failure with hypoxia and hypercarbia ([place]) 07/02/2013  . Protein calorie malnutrition ([place]) 07/02/2013  . Acute encephalopathy 07/02/2013  . Community acquired bacterial pneumonia 07/02/2013  . CAP (community acquired pneumonia) 07/02/2013  . Left inguinal hernia 05/12/2012  . Anxiety   . Essential hypertension   . Benign hypertension 04/25/2011  . Anemia 04/24/2011  . Acute respiratory failure ([place]) 04/23/2011  . COPD with exacerbation ([place]) 04/23/2011  . Community acquired pneumonia 04/23/2011  . Sinus tachycardia ([place]) 04/23/2011  . Tobacco abuse 04/23/2011  . Hyperglycemia 04/23/2011  . Dehydration 04/23/2011    Palliative Care Assessment & Plan   Patient Profile: Shane Todd is a 70 year old male with extensive pulmonary history, he was brought to the hospital with chronic respiratory failure the 2nd time in a month, was intubated, and family decided for extubation today regardless of the outcome.  Assessment: as above  Recommendations/Plan:  continue to treat the treatable, continue IV fluids, IV antibiotics, adding morphine for market work of breathing.  Goals of Care and Additional Recommendations:  Limitations on Scope of Treatment: Treat the treatable, at this time, no extraordinary measures.  Code Status:    Code Status Orders        Start     Ordered   06/26/15 0808  Do not attempt resuscitation (DNR)   Continuous    Question Answer Comment  In the event of cardiac or respiratory ARREST Do not call a "code blue"   In the event of cardiac or respiratory ARREST Do not perform Intubation, CPR, defibrillation or ACLS   In the event of cardiac or respiratory ARREST Use medication by any route, position, wound care, and other measures to relive pain and suffering. May use oxygen, suction and manual treatment of airway obstruction as needed for  comfort.      06/26/15 5409    Code Status History    Date Active Date Inactive Code Status Order ID Comments User Context   06/29/2015  1:47 PM 06/26/2015  8:07 AM Full Code [id number]  Orvan Falconer, MD Inpatient   04/05/2015 10:15 PM 04/18/2015  4:49 PM Full Code [id number]  Theressa Millard, MD Inpatient   07/02/2013 11:18 PM 07/05/2013  1:49 PM Full Code [id number]  Louellen Molder, MD Inpatient   06/06/2012 10:45 AM 06/06/2012  7:01 PM Full Code [id number]  Harl Bowie, MD Inpatient   10/02/2011  4:26 AM 10/04/2011  3:16 PM Full Code [id number]  Derrill Kay, MD ED   04/23/2011  3:18 PM 04/27/2011  5:12 PM Full Code [id number]  Rexene Alberts, MD ED    Advance Directive Documentation        Most Recent Value   Type of Advance Directive  Living will   Pre-existing out of facility DNR order (yellow form or pink MOST form)     "MOST" Form in Place?         Prognosis:   < 4 weeks likely, if Shane Todd is unable to clear his secretions, improved nutrition, and functional status.  Discharge Planning:  To Be Determined  Care plan was discussed with Nursing staff, case manager, social worker, and Dr. Truman Hayward and Dr. Evonnie Pat on next rounds  Thank you for allowing the Palliative Medicine Team to assist in the care of this patient.   Time In: 13045 Time Out: 1345 Total Time 40 minutes Prolonged Time Billed  no       Greater than 50%  of this time was spent counseling and coordinating care related to the above assessment and plan.  Kevia Zaucha A, NP  Please contact Palliative Medicine Team phone at [phone] for questions and concerns.

## 2015-06-28 NOTE — Progress Notes (Signed)
Subjective: He remains intubated and on the ventilator. Sedation is being reduced and he is gesturing that he wants the endotracheal tube out. Family had palliative care discussion yesterday and when I talked to them this morning they want him to be extubated regardless of whether he meets weaning criteria or not.  Objective: Vital signs in last 24 hours: Temp:  [97 F (36.1 C)-99.1 F (37.3 C)] 97.8 F (36.6 C) (05/16 0745) Pulse Rate:  [60-113] 95 (05/16 0700) Resp:  [0-28] 27 (05/16 0700) BP: (93-194)/(49-107) 174/95 mmHg (05/16 0700) SpO2:  [97 %-100 %] 100 % (05/16 0700) FiO2 (%):  [35 %] 35 % (05/16 0422) Weight:  [72.5 kg (159 lb 13.3 oz)-77 kg (169 lb 12.1 oz)] 72.5 kg (159 lb 13.3 oz) (05/16 0500) Weight change:  Last BM Date: 06/26/15  Intake/Output from previous day: 05/15 0701 - 05/16 0700 In: 3638.3 [I.V.:2578.3; NG/GT:960; IV Piggyback:100] Out: 5300 [Urine:5300]  PHYSICAL EXAM General appearance: He is awake. He has protective mittens on. He is still intubated. He is able to answer questions with gestures Resp: rhonchi bilaterally Cardio: regular rate and rhythm, S1, S2 normal, no murmur, click, rub or gallop GI: soft, non-tender; bowel sounds normal; no masses,  no organomegaly Extremities: extremities normal, atraumatic, no cyanosis or edema  Lab Results:  Results for orders placed or performed during the hospital encounter of 06/18/2015 (from the past 48 hour(s))  Glucose, capillary     Status: Abnormal   Collection Time: 06/26/15 11:13 AM  Result Value Ref Range   Glucose-Capillary 195 (H) 65 - 99 mg/dL   Comment 1 Notify RN    Comment 2 Document in Chart   Glucose, capillary     Status: Abnormal   Collection Time: 06/26/15  3:37 PM  Result Value Ref Range   Glucose-Capillary 207 (H) 65 - 99 mg/dL   Comment 1 Notify RN   Glucose, capillary     Status: Abnormal   Collection Time: 06/26/15  7:30 PM  Result Value Ref Range   Glucose-Capillary 222 (H) 65 -  99 mg/dL   Comment 1 Notify RN   Glucose, capillary     Status: Abnormal   Collection Time: 06/26/15 11:38 PM  Result Value Ref Range   Glucose-Capillary 212 (H) 65 - 99 mg/dL   Comment 1 Notify RN   Glucose, capillary     Status: Abnormal   Collection Time: 06/27/15  3:57 AM  Result Value Ref Range   Glucose-Capillary 201 (H) 65 - 99 mg/dL   Comment 1 Notify RN   Blood gas, arterial     Status: Abnormal   Collection Time: 06/27/15  4:15 AM  Result Value Ref Range   FIO2 0.35    Delivery systems VENTILATOR    Mode PRESSURE REGULATED VOLUME CONTROL    VT 550 mL   LHR 12 resp/min   Peep/cpap 3.0 cm H20   pH, Arterial 7.325 (L) 7.350 - 7.450   pCO2 arterial 65.3 (HH) 35.0 - 45.0 mmHg    Comment: CRITICAL RESULT CALLED TO, READ BACK BY AND VERIFIED WITH:  TO WAGONER R RN AT 0425 06/27/2015 BY [employer] RRT    pO2, Arterial 98.5 80.0 - 100.0 mmHg   Bicarbonate 29.2 (H) 20.0 - 24.0 mEq/L   Acid-Base Excess 7.2 (H) 0.0 - 2.0 mmol/L   O2 Saturation 97.1 %   Collection site BRACHIAL ARTERY    Drawn by [id number]    Sample type ARTERIAL    Allens test (pass/fail) NOT  INDICATED (A) PASS  CBC     Status: Abnormal   Collection Time: 06/27/15  4:31 AM  Result Value Ref Range   WBC 15.3 (H) 4.0 - 10.5 K/uL   RBC 3.73 (L) 4.22 - 5.81 MIL/uL   Hemoglobin 11.4 (L) 13.0 - 17.0 g/dL   HCT 37.1 (L) 39.0 - 52.0 %   MCV 99.5 78.0 - 100.0 fL   MCH 30.6 26.0 - 34.0 pg   MCHC 30.7 30.0 - 36.0 g/dL   RDW 15.5 11.5 - 15.5 %   Platelets 193 150 - 400 K/uL  Glucose, capillary     Status: Abnormal   Collection Time: 06/27/15  7:39 AM  Result Value Ref Range   Glucose-Capillary 197 (H) 65 - 99 mg/dL   Comment 1 Notify RN    Comment 2 Document in Chart   Glucose, capillary     Status: Abnormal   Collection Time: 06/27/15 11:29 AM  Result Value Ref Range   Glucose-Capillary 188 (H) 65 - 99 mg/dL   Comment 1 Notify RN    Comment 2 Document in Chart   Glucose, capillary     Status: Abnormal   Collection  Time: 06/27/15  4:30 PM  Result Value Ref Range   Glucose-Capillary 131 (H) 65 - 99 mg/dL   Comment 1 Notify RN    Comment 2 Document in Chart   Glucose, capillary     Status: Abnormal   Collection Time: 06/27/15  7:29 PM  Result Value Ref Range   Glucose-Capillary 121 (H) 65 - 99 mg/dL  Glucose, capillary     Status: Abnormal   Collection Time: 06/27/15 11:57 PM  Result Value Ref Range   Glucose-Capillary 224 (H) 65 - 99 mg/dL  Blood gas, arterial     Status: Abnormal   Collection Time: 06/28/15  4:28 AM  Result Value Ref Range   FIO2 0.35    O2 Content 35.0 L/min   Delivery systems VENTILATOR    Mode PRESSURE REGULATED VOLUME CONTROL    VT 550 mL   LHR 12.0 resp/min   Peep/cpap 5.0 cm H20   pH, Arterial 7.407 7.350 - 7.450   pCO2 arterial 63.1 (HH) 35.0 - 45.0 mmHg    Comment: CRITICAL RESULT CALLED TO, READ BACK BY AND VERIFIED WITH: CINDY PHILLIPS,RN BY BFRETWELL RRT,RCP ON 06/28/15 AT 0440    pO2, Arterial 141 (H) 80.0 - 100.0 mmHg   Bicarbonate 36.0 (H) 20.0 - 24.0 mEq/L   TCO2 16.2 0 - 100 mmol/L   Acid-Base Excess 13.6 (H) 0.0 - 2.0 mmol/L   O2 Saturation 98.9 %   Collection site RIGHT RADIAL    Drawn by [id number]    Sample type ARTERIAL DRAW    Allens test (pass/fail) PASS PASS  Glucose, capillary     Status: Abnormal   Collection Time: 06/28/15  4:52 AM  Result Value Ref Range   Glucose-Capillary 223 (H) 65 - 99 mg/dL  Glucose, capillary     Status: Abnormal   Collection Time: 06/28/15  7:31 AM  Result Value Ref Range   Glucose-Capillary 199 (H) 65 - 99 mg/dL   Comment 1 Notify RN    Comment 2 Document in Chart     ABGS  Recent Labs  06/28/15 0428  PHART 7.407  PO2ART 141*  TCO2 16.2  HCO3 36.0*   CULTURES Recent Results (from the past 240 hour(s))  MRSA PCR Screening     Status: None   Collection Time: 07/13/2015  2:10 PM  Result Value Ref Range Status   MRSA by PCR NEGATIVE NEGATIVE Final    Comment:        The GeneXpert MRSA Assay  (FDA approved for NASAL specimens only), is one component of a comprehensive MRSA colonization surveillance program. It is not intended to diagnose MRSA infection nor to guide or monitor treatment for MRSA infections.   Culture, respiratory (NON-Expectorated)     Status: None   Collection Time: 06/26/2015  4:00 PM  Result Value Ref Range Status   Specimen Description TRACHEAL ASPIRATE  Final   Special Requests Immunocompromised  Final   Gram Stain   Final    MODERATE WBC PRESENT, PREDOMINANTLY PMN NO SQUAMOUS EPITHELIAL CELLS SEEN NO ORGANISMS SEEN Performed at [facility]    Culture   Final    NO GROWTH 2 DAYS Performed at [facility]    Report Status 06/27/2015 FINAL  Final   Studies/Results: Dg Chest Port 1 View  06/26/2015  CLINICAL DATA:  Respiratory failure EXAM: PORTABLE CHEST 1 VIEW COMPARISON:  06/25/2015 FINDINGS: Endotracheal tube in satisfactory position. NG tube noted with the tip not visualized. COPD with apical scarring and hyperinflation. Negative for heart failure. Small left effusion. Negative for pneumonia. IMPRESSION: Endotracheal tube in good position COPD with scarring Small left effusion Electronically Signed   By: Franchot Gallo M.D.   On: 06/26/2015 10:49    Medications:  Prior to Admission:  Prescriptions prior to admission  Medication Sig Dispense Refill Last Dose  . albuterol (PROVENTIL HFA;VENTOLIN HFA) 108 (90 BASE) MCG/ACT inhaler Inhale 2 puffs into the lungs every 4 (four) hours as needed for wheezing or shortness of breath. For shortness of breath   07/04/2015 at Unknown time  . amLODipine (NORVASC) 5 MG tablet Take 1 tablet by mouth daily.   06/21/2015 at Unknown time  . DULERA 100-5 MCG/ACT AERO Inhale 2 puffs into the lungs 2 (two) times daily.   06/21/2015 at Unknown time  . lisinopril (PRINIVIL,ZESTRIL) 20 MG tablet Take 20 mg by mouth every morning.    06/21/2015 at Unknown time  . traZODone (DESYREL) 100 MG tablet Take 0.5-1  tablets (50-100 mg total) by mouth at bedtime. 30 tablet 1 06/21/2015 at Unknown time  . feeding supplement, ENSURE ENLIVE, (ENSURE ENLIVE) LIQD Take 237 mLs by mouth 2 (two) times daily between meals. (Patient not taking: Reported on 06/28/2015)     . ipratropium-albuterol (DUONEB) 0.5-2.5 (3) MG/3ML SOLN Take 3 mLs by nebulization 2 (two) times daily. (Patient not taking: Reported on 06/27/2015)     . nicotine (NICODERM CQ - DOSED IN MG/24 HOURS) 21 mg/24hr patch Place 1 patch (21 mg total) onto the skin daily. (Patient not taking: Reported on 06/21/2015)     . predniSONE (DELTASONE) 10 MG tablet Take with breakfast daily for 2 more days. (Patient not taking: Reported on 06/20/2015)     . risperiDONE (RISPERDAL) 0.25 MG tablet Take 1 tablet (0.25 mg total) by mouth at bedtime. (Patient not taking: Reported on 06/28/2015) 30 tablet 1   . senna-docusate (SENOKOT-S) 8.6-50 MG tablet Take 1 tablet by mouth 2 (two) times daily. (Patient not taking: Reported on 07/05/2015)      Scheduled: . antiseptic oral rinse  7 mL Mouth Rinse QID  . chlorhexidine gluconate (SAGE KIT)  15 mL Mouth Rinse BID  . furosemide  40 mg Intravenous Q12H  . insulin aspart  0-15 Units Subcutaneous Q4H  . ipratropium-albuterol  3 mL Nebulization Q6H  .  levofloxacin (LEVAQUIN) IV  750 mg Intravenous Q24H  . methylPREDNISolone (SOLU-MEDROL) injection  60 mg Intravenous Q6H  . pantoprazole sodium  40 mg Per Tube BID  . sodium chloride flush  3 mL Intravenous Q12H   Continuous: . dextrose 5 % and 0.9% NaCl 100 mL/hr at 06/28/15 0701  . feeding supplement (JEVITY 1.2 CAL) 1,000 mL (06/28/15 0701)  . propofol (DIPRIVAN) infusion Stopped (06/28/15 0735)   ZWR:[id number], fentaNYL (SUBLIMAZE) injection, ondansetron **OR** ondansetron (ZOFRAN) IV  Assesment: He has acute hypoxic/hypercapnic respiratory failure. He's been difficult to wean at least partially because of secretions. He's had trouble with metabolic encephalopathy. Family  discussion with palliative care reveals that they would like him to be extubated and if he does well that is good but if he does not then they would request medications for comfort. I think this is appropriate considering his overall situation Principal Problem:   Acute respiratory failure with hypercapnia ([place]) Active Problems:   COPD with exacerbation (HCC)   Tobacco abuse   Essential hypertension   Protein calorie malnutrition (HCC)   Encephalopathy, metabolic   Acute on chronic respiratory failure [facility])   Palliative care encounter   DNR (do not resuscitate) discussion    Plan: Extubation today    LOS: 6 days   Aundrey Elahi L 06/28/2015, 8:13 AM

## 2015-06-28 NOTE — Procedures (Signed)
Extubation Procedure Note  Patient Details:   Name: Shane Todd DOB: 12/10/1945 MRN: [id number]   Airway Documentation:  Airway 8 mm (Active)  Secured at (cm) 26 cm 06/28/2015  7:35 AM  Measured From Lips 06/28/2015  7:35 AM  Secured Location Center 06/28/2015  7:35 AM  Secured By [employer] Tube Holder 06/28/2015  7:35 AM  Tube Holder Repositioned Yes 06/28/2015  7:35 AM  Cuff Pressure (cm H2O) 26 cm H2O 06/27/2015 11:32 PM  Site Condition Dry 06/28/2015  7:35 AM  Patient extubated per Dr. Juanetta GoslingHawkins.  Patient placed on 55% venturi mask, now decreased to 45%.  Sats 100%.  Will continue to monitor.  Family at bedside.  Evaluation  O2 sats: stable throughout Complications: No apparent complications Patient did tolerate procedure well. Bilateral Breath Sounds: Diminished, Rhonchi   Yes  Noreene FilbertLasley, Donovyn Guidice Billingsley 06/28/2015, 8:39 AM

## 2015-06-28 NOTE — Progress Notes (Signed)
[facility] PROGRESS NOTE  Shane Todd ZOX:[id number] DOB: [birth year]    PCP:   [name], MD   HPI: Shane Todd is an 70 y.o. male with severe COPD, continued smoker, dementia, refusal to wear bipap admitted with respiratory failure, intubated, and was given Tx.  He had GI Bleed, and had EGD showing gastric ulcer, and placed on PPI.  After several days discussing with family, and having consultation with palliative care and pulmonary, there is a unified agreement among family, patient, nursing staff, and consultants, that he is to be extubated today, and if he need any respiratory support, then comfort care be instituted even if death is a side effect of any medication given.  He was extubated earlier this am, and at this time, he is having some breathing difficulty.  He was started on oral morphine, but is starting to require IV morphine.   Rewiew of Systems:  Constitutional: Negative for malaise, fever and chills. No significant weight loss or weight gain Eyes: Negative for eye pain, redness and discharge, diplopia, visual changes, or flashes of light. ENMT: Negative for ear pain, hoarseness, nasal congestion, sinus pressure and sore throat. No headaches; tinnitus, drooling, or problem swallowing. Cardiovascular: Negative for chest pain, palpitations, diaphoresis, dyspnea and peripheral edema. ; No orthopnea, PND Respiratory: Negative for cough, hemoptysis, and stridor. No pleuritic chestpain. Gastrointestinal: Negative for nausea, vomiting, diarrhea, constipation, abdominal pain, melena, blood in stool, hematemesis, jaundice and rectal bleeding.    Genitourinary: Negative for frequency, dysuria, incontinence,flank pain and hematuria; Musculoskeletal: Negative for back pain and neck pain. Negative for swelling and trauma.;  Skin: . Negative for pruritus, rash, abrasions, bruising and skin lesion.; ulcerations Neuro: Negative for headache, lightheadedness and neck  stiffness. Negative for weakness, altered level of consciousness , altered mental status, extremity weakness, burning feet, involuntary movement, seizure and syncope.  Psych: negative for anxiety, depression, insomnia, tearfulness, panic attacks, hallucinations, paranoia, suicidal or homicidal ideation    Past Medical History  Diagnosis Date  . COPD (chronic obstructive pulmonary disease) (HCC)   . Hypertension   . Tobacco abuse   . Anxiety   . Anemia 04/24/2011  . Pneumonia     hospitalized 04/2011  . Shortness of breath     with exertion  . GERD (gastroesophageal reflux disease)   . Constipation   . Eczema   . Kidney stones   . Dementia with behavioral disturbance 04/18/2015  . Acute respiratory failure with hypoxia and hypercarbia (HCC) 07/02/2013  . COPD exacerbation (HCC) 04/05/2015    Past Surgical History  Procedure Laterality Date  . Hemorrhoid surgery    . Colonoscopy    . Inguinal hernia repair Left 06/06/2012    Procedure: HERNIA REPAIR INGUINAL ADULT;  Surgeon: Shelly Rubenstein, MD;  Location: [facility] OR;  Service: General;  Laterality: Left;  . Insertion of mesh Left 06/06/2012    Procedure: INSERTION OF MESH;  Surgeon: Shelly Rubenstein, MD;  Location: [facility] OR;  Service: General;  Laterality: Left;  . Hernia repair Left 06/06/2012  . Esophagogastroduodenoscopy N/A 07-10-15    Procedure: ESOPHAGOGASTRODUODENOSCOPY (EGD);  Surgeon: Malissa Hippo, MD;  Location: [facility] ENDO SUITE;  Service: Endoscopy;  Laterality: N/A;    Medications:  HOME MEDS: Prior to Admission medications   Medication Sig Start Date End Date Taking? Authorizing Provider  albuterol (PROVENTIL HFA;VENTOLIN HFA) 108 (90 BASE) MCG/ACT inhaler Inhale 2 puffs into the lungs every 4 (four) hours as needed for wheezing or shortness of breath.  For shortness of breath   Yes Historical Provider, MD  amLODipine (NORVASC) 5 MG tablet Take 1 tablet by mouth daily. 03/22/15  Yes Historical Provider, MD  DULERA 100-5  MCG/ACT AERO Inhale 2 puffs into the lungs 2 (two) times daily. 05/10/15  Yes Historical Provider, MD  lisinopril (PRINIVIL,ZESTRIL) 20 MG tablet Take 20 mg by mouth every morning.    Yes Historical Provider, MD  traZODone (DESYREL) 100 MG tablet Take 0.5-1 tablets (50-100 mg total) by mouth at bedtime. 04/18/15  Yes Elliot Cousinenise Fisher, MD  feeding supplement, ENSURE ENLIVE, (ENSURE ENLIVE) LIQD Take 237 mLs by mouth 2 (two) times daily between meals. Patient not taking: Reported on 06/18/2015 04/18/15   Elliot Cousinenise Fisher, MD  ipratropium-albuterol (DUONEB) 0.5-2.5 (3) MG/3ML SOLN Take 3 mLs by nebulization 2 (two) times daily. Patient not taking: Reported on 07/05/2015 04/18/15   Elliot Cousinenise Fisher, MD  nicotine (NICODERM CQ - DOSED IN MG/24 HOURS) 21 mg/24hr patch Place 1 patch (21 mg total) onto the skin daily. Patient not taking: Reported on 07/12/2015 04/18/15   Elliot Cousinenise Fisher, MD  predniSONE (DELTASONE) 10 MG tablet Take with breakfast daily for 2 more days. Patient not taking: Reported on 06/17/2015 04/18/15   Elliot Cousinenise Fisher, MD  risperiDONE (RISPERDAL) 0.25 MG tablet Take 1 tablet (0.25 mg total) by mouth at bedtime. Patient not taking: Reported on 06/26/2015 04/18/15   Elliot Cousinenise Fisher, MD  senna-docusate (SENOKOT-S) 8.6-50 MG tablet Take 1 tablet by mouth 2 (two) times daily. Patient not taking: Reported on 07/09/2015 04/18/15   Elliot Cousinenise Fisher, MD     Allergies:  Allergies  Allergen Reactions  . Oxycodone Nausea And Vomiting    Social History:   reports that he has been smoking.  He does not have any smokeless tobacco history on file. He reports that he does not drink alcohol or use illicit drugs.  Family History: Family History  Problem Relation Age of Onset  . Stroke Mother   . Deep vein thrombosis Father   . Lung cancer Sister   . Stroke Sister   . Hypertension Sister   . Heart disease Brother   . Stroke Brother   . Stroke Sister   . Heart disease Sister      Physical Exam: Filed Vitals:   06/28/15  1300 06/28/15 1345 06/28/15 1400 06/28/15 1413  BP:   184/103   Pulse: 100 108 98   Temp:      TempSrc:      Resp: 17 29 23    Height:      Weight:      SpO2: 100% 100% 100% 100%   Blood pressure 184/103, pulse 98, temperature 97.8 F (36.6 C), temperature source Axillary, resp. rate 23, height 6\' 2"  (1.88 m), weight 72.5 kg (159 lb 13.3 oz), SpO2 100 %.  GEN:  Pleasant  patient lying in the stretcher cooperative with exam. PSYCH:  alert and oriented x4; does not appear anxious or depressed; affect is appropriate. HEENT: Mucous membranes pink and anicteric; PERRLA; EOM intact; no cervical lymphadenopathy nor thyromegaly or carotid bruit; no JVD; There were no stridor. Neck is very supple. Breasts:: Not examined CHEST WALL: No tenderness CHEST: He has tachypnea and dyspnea.  HEART: Regular rate and rhythm.  There are no murmur, rub, or gallops.   BACK: No kyphosis or scoliosis; no CVA tenderness ABDOMEN: soft and non-tender; no masses, no organomegaly, normal abdominal bowel sounds; no pannus; no intertriginous candida. There is no rebound and no distention. Rectal Exam: Not done EXTREMITIES:  No bone or joint deformity; age-appropriate arthropathy of the hands and knees; no edema; no ulcerations.  There is no calf tenderness. Genitalia: not examined PULSES: 2+ and symmetric SKIN: Normal hydration no rash or ulceration CNS: Cranial nerves 2-12 grossly intact no focal lateralizing neurologic deficit.  Speech is fluent; uvula elevated with phonation, facial symmetry and tongue midline. DTR are normal bilaterally, cerebella exam is intact, barbinski is negative and strengths are equaled bilaterally.  No sensory loss.   Labs on Admission:  Basic Metabolic Panel:  Recent Labs Lab 06/15/2015 0726 06/23/15 0430 06/26/15 0440  NA 142 142 143  K 4.2 3.3* 4.5  CL 96* 106 110  CO2 34* 30 33*  GLUCOSE 149* 216* 203*  BUN 48* 44* 31*  CREATININE 0.84 0.79 0.56*  CALCIUM 9.4 8.4* 7.8*    Liver Function Tests:  Recent Labs Lab 06/26/15 0440  AST 12*  ALT 10*  ALKPHOS 52  BILITOT 0.2*  PROT 4.8*  ALBUMIN 2.2*   CBC:  Recent Labs Lab 06/18/2015 0726  Jun 30, 2015 0858 30-Jun-2015 1412 2015/06/30 2050 06/25/15 0222 06/27/15 0431  WBC 8.2  < > 10.1 11.5* 9.5 9.9 15.3*  NEUTROABS 6.4  --   --   --   --   --   --   HGB 12.4*  < > 11.4* 11.6* 10.4* 10.6* 11.4*  HCT 39.9  < > 36.2* 37.2* 33.6* 33.9* 37.1*  MCV 97.8  < > 97.6 97.9 98.0 98.0 99.5  PLT 220  < > 218 221 192 208 193  < > = values in this interval not displayed. Cardiac Enzymes:  Recent Labs Lab 07/05/2015 0726  TROPONINI <0.03    CBG:  Recent Labs Lab 06/27/15 1929 06/27/15 2357 06/28/15 0452 06/28/15 0731 06/28/15 1121  GLUCAP 121* 224* 223* 199* 121*    Assessment/Plan Present on Admission:  . Acute respiratory failure with hypercapnia (HCC) . COPD with exacerbation (HCC) . Encephalopathy, metabolic . Protein calorie malnutrition (HCC) . Tobacco abuse . Essential hypertension . Acute on chronic respiratory failure (HCC)  PLAN:  Acute on chronic respiratory failure:  Have been extubated this am.  Continue with current tx, but no acceleration of care.  I suspect however, that he will get more fatigue and more morphine will need to be given.  If that is the case, his death will be imminent.  Family and patient are well aware of this.  We will implement this plan of care at this time.   Other plans as per orders.  Code Status: DNR/DNI, and comfort care is priority.   Houston Siren, MD.  FACP [facility] Pager [phone] 7pm to 7am.  06/28/2015, 4:18 PM

## 2015-06-28 NOTE — Consult Note (Signed)
   [facility]   06/28/2015  Shane Todd 14-May-1945 [id number]  [facility], chart reviewed. Noted Palliative Care Medicine notes. [facility] not appropriate at this time. For questions or concerns please contact:  Alben SpittleMary E. Albertha GheeNiemczura, RN, BSN, [facility]  Hospital Liaison [facility] [phone]) Business Cell  [phone]) Toll Free Office

## 2015-06-29 DIAGNOSIS — J441 Chronic obstructive pulmonary disease with (acute) exacerbation: Principal | ICD-10-CM

## 2015-06-29 DIAGNOSIS — Z7189 Other specified counseling: Secondary | ICD-10-CM

## 2015-06-29 DIAGNOSIS — J9602 Acute respiratory failure with hypercapnia: Secondary | ICD-10-CM

## 2015-06-29 MED ORDER — IPRATROPIUM-ALBUTEROL 0.5-2.5 (3) MG/3ML IN SOLN: 3.0000 mL | Freq: Four times a day (QID) | RESPIRATORY_TRACT | Status: DC | PRN

## 2015-06-29 MED ORDER — SODIUM CHLORIDE 0.9 % IV SOLN
4.0000 mg/h | INTRAVENOUS | Status: DC
  Administered 2015-06-29: 4 mg/h via INTRAVENOUS
  Filled 2015-06-29: qty 10

## 2015-06-29 MED ORDER — LORAZEPAM 2 MG/ML IJ SOLN
1.0000 mg | INTRAMUSCULAR | Status: DC | PRN
  Administered 2015-06-29 (×2): 1 mg via INTRAVENOUS
  Filled 2015-06-29 (×2): qty 1

## 2015-06-29 NOTE — Progress Notes (Signed)
PROGRESS NOTE    Shane Todd  ZHY:[id number] DOB: Oct 24, 1945 DOA: 07/13/2015 PCP: Rocky Morel, MD  Outpatient Specialists:  Brief Narrative: 52 yom with severe COPD with tobacco use, dementia presented with respiratory failure and was reported to refuse to wear BiPAP. He was intubated 5/10 and after multiple consultations with family, pulmonary and PMT extubation was decided for 5/16 and per family, he was been placed on comfort care. Also noted to have GI bleed with EGD revealing gastric ucler and placed on PPI. He has been transitioned from oral to IV morphine.   Assessment & Plan: Principal Problem:   Acute respiratory failure with hypercapnia (HCC) Active Problems:   COPD with exacerbation (HCC)   Tobacco abuse   Essential hypertension   Protein calorie malnutrition (HCC)   Encephalopathy, metabolic   Acute on chronic respiratory failure ([place])   Palliative care encounter   DNR (do not resuscitate) discussion  1. Acute respiratory failure with hypercapnia, patient extubated 5/16 with no plans for reintubation. Patient does not want to wear any NIPPV. He is receiving morphine for respiratory distress. Family wishes to continue with current treatments. If no improvement will transition to full comfort care. Since the pt is requiring hourly morphine doses will transition to morphine infusions.  2. COPD exacerbation. Started on Levaquin, nebs, and steroids. Continue current treatments. 3. Metabolic encephalopathy.  4. Protein calorie malnutrition. Stable.  5. Tobacco abuse.  6. Essential HTN.    DVT prophylaxis: None-Comfort care Code Status: DNR/DNI-Comfort care Family Communication: Family bedside.  Disposition Plan: Transfer to medical bed. Prognosis guarded.   Consultants:   Palliative   Pulmonology   Gastroenterology   Procedures:   Intubation 5/10>>5/16  Antimicrobials:   Levaquin 5/10>>   Subjective: Unable to obtain due to sedative  medications.   Per family bedside he has a mild cough.   Objective: Filed Vitals:   06/29/15 0244 06/29/15 0400 06/29/15 0500 06/29/15 0600  BP:  160/81  169/83  Pulse:  91 93 99  Temp:  97.7 F (36.5 C)    TempSrc:  Oral    Resp:  14 17 21   Height:      Weight:   67.9 kg (149 lb 11.1 oz)   SpO2: 100% 100% 100% 100%    Intake/Output Summary (Last 24 hours) at 06/29/15 0637 Last data filed at 06/29/15 0600  Gross per 24 hour  Intake   2500 ml  Output   5750 ml  Net  -3250 ml   Filed Weights   06/27/15 1930 06/28/15 0500 06/29/15 0500  Weight: 77 kg (169 lb 12.1 oz) 72.5 kg (159 lb 13.3 oz) 67.9 kg (149 lb 11.1 oz)    Examination: General exam: NAD  Respiratory system: Rhonchi in upper airways with diminished breath sound bilaterally. Cardiovascular system: S1 & S2 heard, RRR. No JVD, murmurs, rubs, gallops or clicks. No pedal edema. Gastrointestinal system: Abdomen is nondistended, soft and nontender. No organomegaly or masses felt. Normal bowel sounds heard. Psychiatry: Unable to assess.     Data Reviewed  CBC:  Recent Labs Lab 06/23/2015 0726  06/13/2015 0858 07/12/2015 1412 07/11/2015 2050 06/25/15 0222 06/27/15 0431  WBC 8.2  < > 10.1 11.5* 9.5 9.9 15.3*  NEUTROABS 6.4  --   --   --   --   --   --   HGB 12.4*  < > 11.4* 11.6* 10.4* 10.6* 11.4*  HCT 39.9  < > 36.2* 37.2* 33.6* 33.9* 37.1*  MCV 97.8  < >  97.6 97.9 98.0 98.0 99.5  PLT 220  < > 218 221 192 208 193  < > = values in this interval not displayed. Basic Metabolic Panel:  Recent Labs Lab 06/18/2015 0726 06/23/15 0430 06/26/15 0440  NA 142 142 143  K 4.2 3.3* 4.5  CL 96* 106 110  CO2 34* 30 33*  GLUCOSE 149* 216* 203*  BUN 48* 44* 31*  CREATININE 0.84 0.79 0.56*  CALCIUM 9.4 8.4* 7.8*   Liver Function Tests:  Recent Labs Lab 06/26/15 0440  AST 12*  ALT 10*  ALKPHOS 52  BILITOT 0.2*  PROT 4.8*  ALBUMIN 2.2*  Cardiac Enzymes:  Recent Labs Lab 06/19/2015 0726  TROPONINI <0.03     Recent Labs Lab 06/27/15 1929 06/27/15 2357 06/28/15 0452 06/28/15 0731 06/28/15 1121  GLUCAP 121* 224* 223* 199* 121*   Urine analysis:    Component Value Date/Time   COLORURINE YELLOW 06/15/2015 1125   APPEARANCEUR CLEAR 06/16/2015 1125   LABSPEC >1.030* 06/17/2015 1125   PHURINE 6.0 07/13/2015 1125   GLUCOSEU NEGATIVE 06/23/2015 1125   HGBUR NEGATIVE 06/14/2015 1125   [place] 06/26/2015 1125   KETONESUR NEGATIVE 07/06/2015 1125   PROTEINUR 30* 06/28/2015 1125   UROBILINOGEN 0.2 07/03/2013 0425   NITRITE NEGATIVE 06/16/2015 1125   LEUKOCYTESUR NEGATIVE 07/05/2015 1125   Sepsis Labs: @LABRCNTIP (procalcitonin:4,lacticidven:4)  ) Recent Results (from the past 240 hour(s))  MRSA PCR Screening     Status: None   Collection Time: 06/23/2015  2:10 PM  Result Value Ref Range Status   MRSA by PCR NEGATIVE NEGATIVE Final    Comment:        The GeneXpert MRSA Assay (FDA approved for NASAL specimens only), is one component of a comprehensive MRSA colonization surveillance program. It is not intended to diagnose MRSA infection nor to guide or monitor treatment for MRSA infections.   Culture, respiratory (NON-Expectorated)     Status: None   Collection Time: 06/28/2015  4:00 PM  Result Value Ref Range Status   Specimen Description TRACHEAL ASPIRATE  Final   Special Requests Immunocompromised  Final   Gram Stain   Final    MODERATE WBC PRESENT, PREDOMINANTLY PMN NO SQUAMOUS EPITHELIAL CELLS SEEN NO ORGANISMS SEEN Performed at [facility]    Culture   Final    NO GROWTH 2 DAYS Performed at [facility]    Report Status 06/27/2015 FINAL  Final         Radiology Studies: No results found.      Scheduled Meds: . antiseptic oral rinse  7 mL Mouth Rinse QID  . chlorhexidine gluconate (SAGE KIT)  15 mL Mouth Rinse BID  . furosemide  40 mg Intravenous Q12H  . ipratropium-albuterol  3 mL Nebulization Q6H  . levofloxacin  (LEVAQUIN) IV  750 mg Intravenous Q24H  . methylPREDNISolone (SOLU-MEDROL) injection  60 mg Intravenous Q6H  . pantoprazole sodium  40 mg Per Tube BID  . scopolamine  1 patch Transdermal Q72H  . sodium chloride flush  3 mL Intravenous Q12H   Continuous Infusions: . dextrose 5 % and 0.9% NaCl 100 mL/hr at 06/28/15 1700     LOS: 7 days    Time spent: 25 minutes   Kathie Dike, MD [facility] Pager [phone] If 7PM-7AM, please contact night-coverage [email or web address] Password [facility] 06/29/2015, 6:37 AM    By signing my name below, I, Rennis Harding, attest that this documentation has been prepared under the direction and in the presence of [facility]  Roderic Palau, MD. Electronically signed: Rennis Harding, Scribe. 06/29/2015 9:15am   I, Dr. Kathie Dike, personally performed the services described in this documentaiton. All medical record entries made by the scribe were at my direction and in my presence. I have reviewed the chart and agree that the record reflects my personal performance and is accurate and complete  Kathie Dike, MD, 06/29/2015 9:34 AM

## 2015-06-29 NOTE — Progress Notes (Signed)
Daily Progress Note   Patient Name: Shane Todd       Date: 06/29/2015 DOB: [birth year]  Age: 70 y.o. MRN#: [id number] Attending Physician: Kathie Dike, MD Primary Care Physician: Rocky Morel, MD Admit Date: 07/09/2015  Reason for Consultation/Follow-up: Disposition, Non pain symptom management, Psychosocial/spiritual support and Withdrawal of life-sustaining treatment  Subjective: Shane Todd is resting quietly in bed. He is showing no signs of respiratory distress at this time, respiratory rate in the midteens with nasal cannula.  He does not open his eyes when I touch him.  His wife, brother, son, and sister-in-law are and bedside. We talk about his symptom management, and they say they feel he is comfortable. That we talk about the line between continuing with curative treatment and making him comfortable. I share my worries that he if he is too sleepy, he will not be able to cough adequately to clear his secretions. We talk about continuing with curative care for another day or two versus transitioning to comfort care only. I share that either one would be appropriate at this time based on Shane Todd and the family's decision.    Son Shane Todd asks about recent labs, and I share that there were no labs this morning, but last white blood cells were 15.5.   Shane Todd asks about getting more labs to see where his white blood cells are trending, with the thought that Shane Todd may be able to rebound. Sister-in-law states, "until it happens again".  I share that Shane Todd will continue to have respiratory distress, periods of exacerbation, repeat hospitalizations. Shane Todd then talks about his fathers repeat hospitalizations that are coming closer together. Family shares that Shane Todd  has told them that he would rather die than to return to the skilled nursing home. Wife, Shane Todd, states, "you remember yesterday he said he felt worthless". Family states they will wait for his next period of wakefulness and ask Shane Todd his preference. Shane Todd states she feels sure that he would like to focus on comfort only.  Meeting with family later in the day.  They elect for full comfort measures only at this time.  We discuss prognosis, and the possible transfer to [address], and or Hospice home of [facility].  I shares the expected changes that occur during the transition process.  Will continue to support this family through this difficult time.   Length of Stay: 7  Current Medications: Scheduled Meds:  . antiseptic oral rinse  7 mL Mouth Rinse QID  . chlorhexidine gluconate (SAGE KIT)  15 mL Mouth Rinse BID  . furosemide  40 mg Intravenous Q12H  . ipratropium-albuterol  3 mL Nebulization Q6H  . levofloxacin (LEVAQUIN) IV  750 mg Intravenous Q24H  . methylPREDNISolone (SOLU-MEDROL) injection  60 mg Intravenous Q6H  . pantoprazole sodium  40 mg Per Tube BID  . scopolamine  1 patch Transdermal Q72H  . sodium chloride flush  3 mL Intravenous Q12H    Continuous Infusions: . dextrose 5 % and 0.9% NaCl 100 mL/hr at 06/28/15 1700  . morphine 4 mg/hr (06/29/15 1043)    PRN Meds: albuterol, LORazepam, morphine injection, morphine CONCENTRATE, ondansetron **OR** ondansetron (ZOFRAN) IV  Physical Exam  Constitutional: No distress.  HENT:  Head: Normocephalic and atraumatic.  Pulmonary/Chest: Effort normal. No respiratory distress.  Respiratory rate in the teens, course breath sounds  Abdominal: Soft. He exhibits no distension.  Neurological:  Lethargic, does not open eyes to touch.  Nursing note and vitals reviewed.           Vital Signs: BP 150/66 mmHg  Pulse 79  Temp(Src) 97.5 F (36.4 C) (Axillary)  Resp 19  Ht _0  (1.88 m)  Wt 67.9 kg (149 lb 11.1 oz)  BMI  19.21 kg/m2  SpO2 100% SpO2: SpO2: 100 % O2 Device: O2 Device: Nasal Cannula O2 Flow Rate: O2 Flow Rate (L/min): 3 L/min  Intake/output summary:  Intake/Output Summary (Last 24 hours) at 06/29/15 1322 Last data filed at 06/29/15 1301  Gross per 24 hour  Intake   2350 ml  Output   4700 ml  Net  -2350 ml   LBM: Last BM Date: 06/26/15 Baseline Weight: Weight: 63.504 kg (140 lb) Most recent weight: Weight: 67.9 kg (149 lb 11.1 oz)       Palliative Assessment/Data:    Flowsheet Rows        Most Recent Value   Intake Tab    Referral Department  Hospitalist   Unit at Time of Referral  ICU   Palliative Care Primary Diagnosis  Pulmonary   Date Notified  06/26/15   Palliative Care Type  New Palliative care   Reason for referral  Psychosocial or Spiritual support   Date of Admission  07/05/2015   Date first seen by Palliative Care  06/27/15   # of days Palliative referral response time  1 Day(s)   # of days IP prior to Palliative referral  4   Clinical Assessment    Palliative Performance Scale Score  10%   Pain Max last 24 hours  Not able to report   Pain Min Last 24 hours  Not able to report   Dyspnea Max Last 24 Hours  Not able to report   Dyspnea Min Last 24 hours  Not able to report   Psychosocial & Spiritual Assessment    Palliative Care Outcomes    Patient/Family meeting held?  Yes   Who was at the meeting?  wife, Shane Todd, brothers Shane Todd and Shane Todd), daughter Shane Todd, son Shane Todd.    Palliative Care Outcomes  Clarified goals of care, Provided advance care planning, Provided psychosocial or spiritual support   Patient/Family wishes: Interventions discontinued/not started   PEG   Palliative Care follow-up planned  -- [follow up at [place]      Patient Active  Problem List   Diagnosis Date Noted  . Palliative care encounter   . DNR (do not resuscitate) discussion   . Acute on chronic respiratory failure ([place]) 07/05/2015  . Dementia with behavioral disturbance 04/18/2015    . Protein-calorie malnutrition, severe 04/15/2015  . Encephalopathy, metabolic 53/97/6734  . Hypercapnic respiratory failure ([place]) 04/05/2015  . COPD exacerbation ([place]) 04/05/2015  . Acute respiratory failure with hypercapnia ([place])   . Dyspnea 07/02/2013  . Acute respiratory failure with hypoxia and hypercarbia ([place]) 07/02/2013  . Protein calorie malnutrition ([place]) 07/02/2013  . Acute encephalopathy 07/02/2013  . Community acquired bacterial pneumonia 07/02/2013  . CAP (community acquired pneumonia) 07/02/2013  . Left inguinal hernia 05/12/2012  . Anxiety   . Essential hypertension   . Benign hypertension 04/25/2011  . Anemia 04/24/2011  . Acute respiratory failure ([place]) 04/23/2011  . COPD with exacerbation ([place]) 04/23/2011  . Community acquired pneumonia 04/23/2011  . Sinus tachycardia ([place]) 04/23/2011  . Tobacco abuse 04/23/2011  . Hyperglycemia 04/23/2011  . Dehydration 04/23/2011    Palliative Care Assessment & Plan   Patient Profile: Shane Todd is a 70 year old male with a history of severe COPD, inability to wear BiPAP at home, continued smoker, hypertension, anxiety, early stages of vascular dementia, who presented to the ED with one week of progressive shortness of breath and productive cough. He was recently hospitalized 1 month ago and spent one week on BiPAP. He was placed in the skilled nursing facility for rehabilitation but absconded.  Assessment: as above  Recommendations/Plan:  family considering full comfort care  Goals of Care and Additional Recommendations:  Limitations on Scope of Treatment: Family considering full comfort care  Code Status:    Code Status Orders        Start     Ordered   06/26/15 0808  Do not attempt resuscitation (DNR)   Continuous    Question Answer Comment  In the event of cardiac or respiratory ARREST Do not call a "code blue"   In the event of cardiac or respiratory ARREST Do not perform Intubation, CPR, defibrillation  or ACLS   In the event of cardiac or respiratory ARREST Use medication by any route, position, wound care, and other measures to relive pain and suffering. May use oxygen, suction and manual treatment of airway obstruction as needed for comfort.      06/26/15 1937    Code Status History    Date Active Date Inactive Code Status Order ID Comments User Context   07/10/2015  1:47 PM 06/26/2015  8:07 AM Full Code [id number]  Orvan Falconer, MD Inpatient   04/05/2015 10:15 PM 04/18/2015  4:49 PM Full Code [id number]  Theressa Millard, MD Inpatient   07/02/2013 11:18 PM 07/05/2013  1:49 PM Full Code [id number]  Louellen Molder, MD Inpatient   06/06/2012 10:45 AM 06/06/2012  7:01 PM Full Code [id number]  Harl Bowie, MD Inpatient   10/02/2011  4:26 AM 10/04/2011  3:16 PM Full Code [id number]  Derrill Kay, MD ED   04/23/2011  3:18 PM 04/27/2011  5:12 PM Full Code [id number]  Rexene Alberts, MD ED    Advance Directive Documentation        Most Recent Value   Type of Advance Directive  Living will   Pre-existing out of facility DNR order (yellow form or pink MOST form)     "MOST" Form in Place?         Prognosis:   < 2 weeks, likely based on Mr.  Everett's desire to "sleep", and likely transitioning to full comfort care only today  Discharge Planning:  Anticipated Hospital death versus hospice home.  Care plan was discussed with Nursing staff, case manager, social worker, and Dr. Roderic Palau.  Thank you for allowing the Palliative Medicine Team to assist in the care of this patient.   Time In: [phone] Time Out: [phone] Total Time 70 minutes  Prolonged Time Billed yes       Greater than 50%  of this time was spent counseling and coordinating care related to the above assessment and plan.  Dove,Tasha A, NP  Please contact Palliative Medicine Team phone at [phone] for questions and concerns.

## 2015-06-29 NOTE — Progress Notes (Signed)
Nutrition Brief Note  Chart reviewed. Pt now transitioning to comfort care.  No further nutrition interventions warranted at this time.  Please re-consult as needed.   Lynn Martin Smeal MS,RD,CSG,LDN Office: #[phone] Pager: #[phone]    

## 2015-06-29 NOTE — Progress Notes (Signed)
Subjective: He was extubated yesterday and tolerated that. He is being transitioned to comfort care. He has had oral and IV morphine but was still agitated. He responded to Ativan.  Objective: Vital signs in last 24 hours: Temp:  [97.7 F (36.5 C)-97.9 F (36.6 C)] 97.7 F (36.5 C) (05/17 0400) Pulse Rate:  [74-108] 99 (05/17 0600) Resp:  [14-29] 21 (05/17 0600) BP: (131-191)/(58-103) 169/83 mmHg (05/17 0600) SpO2:  [100 %] 100 % (05/17 0732) Weight:  [67.9 kg (149 lb 11.1 oz)] 67.9 kg (149 lb 11.1 oz) (05/17 0500) Weight change: -9.1 kg (-20 lb 1 oz) Last BM Date: 06/26/15  Intake/Output from previous day: 05/16 0701 - 05/17 0700 In: 2500 [I.V.:2350; IV Piggyback:150] Out: 5750 [Urine:5750]  PHYSICAL EXAM General appearance: He is awake. He looks mildly agitated. Resp: He has increasing congestion and bilateral rhonchi Cardio: regular rate and rhythm, S1, S2 normal, no murmur, click, rub or gallop GI: soft, non-tender; bowel sounds normal; no masses,  no organomegaly Extremities: extremities normal, atraumatic, no cyanosis or edema  Lab Results:  Results for orders placed or performed during the hospital encounter of 07/04/2015 (from the past 48 hour(s))  Glucose, capillary     Status: Abnormal   Collection Time: 06/27/15 11:29 AM  Result Value Ref Range   Glucose-Capillary 188 (H) 65 - 99 mg/dL   Comment 1 Notify RN    Comment 2 Document in Chart   Glucose, capillary     Status: Abnormal   Collection Time: 06/27/15  4:30 PM  Result Value Ref Range   Glucose-Capillary 131 (H) 65 - 99 mg/dL   Comment 1 Notify RN    Comment 2 Document in Chart   Glucose, capillary     Status: Abnormal   Collection Time: 06/27/15  7:29 PM  Result Value Ref Range   Glucose-Capillary 121 (H) 65 - 99 mg/dL  Glucose, capillary     Status: Abnormal   Collection Time: 06/27/15 11:57 PM  Result Value Ref Range   Glucose-Capillary 224 (H) 65 - 99 mg/dL  Blood gas, arterial     Status: Abnormal    Collection Time: 06/28/15  4:28 AM  Result Value Ref Range   FIO2 0.35    O2 Content 35.0 L/min   Delivery systems VENTILATOR    Mode PRESSURE REGULATED VOLUME CONTROL    VT 550 mL   LHR 12.0 resp/min   Peep/cpap 5.0 cm H20   pH, Arterial 7.407 7.350 - 7.450   pCO2 arterial 63.1 (HH) 35.0 - 45.0 mmHg    Comment: CRITICAL RESULT CALLED TO, READ BACK BY AND VERIFIED WITH: CINDY PHILLIPS,RN BY BFRETWELL RRT,RCP ON 06/28/15 AT 0440    pO2, Arterial 141 (H) 80.0 - 100.0 mmHg   Bicarbonate 36.0 (H) 20.0 - 24.0 mEq/L   TCO2 16.2 0 - 100 mmol/L   Acid-Base Excess 13.6 (H) 0.0 - 2.0 mmol/L   O2 Saturation 98.9 %   Collection site RIGHT RADIAL    Drawn by [id number]    Sample type ARTERIAL DRAW    Allens test (pass/fail) PASS PASS  Glucose, capillary     Status: Abnormal   Collection Time: 06/28/15  4:52 AM  Result Value Ref Range   Glucose-Capillary 223 (H) 65 - 99 mg/dL  Glucose, capillary     Status: Abnormal   Collection Time: 06/28/15  7:31 AM  Result Value Ref Range   Glucose-Capillary 199 (H) 65 - 99 mg/dL   Comment 1 Notify RN  Comment 2 Document in Chart   Glucose, capillary     Status: Abnormal   Collection Time: 06/28/15 11:21 AM  Result Value Ref Range   Glucose-Capillary 121 (H) 65 - 99 mg/dL    ABGS  Recent Labs  06/28/15 0428  PHART 7.407  PO2ART 141*  TCO2 16.2  HCO3 36.0*   CULTURES Recent Results (from the past 240 hour(s))  MRSA PCR Screening     Status: None   Collection Time: 07/04/2015  2:10 PM  Result Value Ref Range Status   MRSA by PCR NEGATIVE NEGATIVE Final    Comment:        The GeneXpert MRSA Assay (FDA approved for NASAL specimens only), is one component of a comprehensive MRSA colonization surveillance program. It is not intended to diagnose MRSA infection nor to guide or monitor treatment for MRSA infections.   Culture, respiratory (NON-Expectorated)     Status: None   Collection Time: 07/08/2015  4:00 PM  Result Value Ref Range  Status   Specimen Description TRACHEAL ASPIRATE  Final   Special Requests Immunocompromised  Final   Gram Stain   Final    MODERATE WBC PRESENT, PREDOMINANTLY PMN NO SQUAMOUS EPITHELIAL CELLS SEEN NO ORGANISMS SEEN Performed at [facility]    Culture   Final    NO GROWTH 2 DAYS Performed at [facility]    Report Status 06/27/2015 FINAL  Final   Studies/Results: No results found.  Medications:  Prior to Admission:  Prescriptions prior to admission  Medication Sig Dispense Refill Last Dose  . albuterol (PROVENTIL HFA;VENTOLIN HFA) 108 (90 BASE) MCG/ACT inhaler Inhale 2 puffs into the lungs every 4 (four) hours as needed for wheezing or shortness of breath. For shortness of breath   07/02/2015 at Unknown time  . amLODipine (NORVASC) 5 MG tablet Take 1 tablet by mouth daily.   06/21/2015 at Unknown time  . DULERA 100-5 MCG/ACT AERO Inhale 2 puffs into the lungs 2 (two) times daily.   07/13/2015 at Unknown time  . lisinopril (PRINIVIL,ZESTRIL) 20 MG tablet Take 20 mg by mouth every morning.    06/21/2015 at Unknown time  . traZODone (DESYREL) 100 MG tablet Take 0.5-1 tablets (50-100 mg total) by mouth at bedtime. 30 tablet 1 06/21/2015 at Unknown time  . feeding supplement, ENSURE ENLIVE, (ENSURE ENLIVE) LIQD Take 237 mLs by mouth 2 (two) times daily between meals. (Patient not taking: Reported on 06/18/2015)     . ipratropium-albuterol (DUONEB) 0.5-2.5 (3) MG/3ML SOLN Take 3 mLs by nebulization 2 (two) times daily. (Patient not taking: Reported on 06/23/2015)     . nicotine (NICODERM CQ - DOSED IN MG/24 HOURS) 21 mg/24hr patch Place 1 patch (21 mg total) onto the skin daily. (Patient not taking: Reported on 07/07/2015)     . predniSONE (DELTASONE) 10 MG tablet Take with breakfast daily for 2 more days. (Patient not taking: Reported on 07/12/2015)     . risperiDONE (RISPERDAL) 0.25 MG tablet Take 1 tablet (0.25 mg total) by mouth at bedtime. (Patient not taking: Reported on 06/15/2015)  30 tablet 1   . senna-docusate (SENOKOT-S) 8.6-50 MG tablet Take 1 tablet by mouth 2 (two) times daily. (Patient not taking: Reported on 06/29/2015)      Scheduled: . antiseptic oral rinse  7 mL Mouth Rinse QID  . chlorhexidine gluconate (SAGE KIT)  15 mL Mouth Rinse BID  . furosemide  40 mg Intravenous Q12H  . ipratropium-albuterol  3 mL Nebulization Q6H  .  levofloxacin (LEVAQUIN) IV  750 mg Intravenous Q24H  . methylPREDNISolone (SOLU-MEDROL) injection  60 mg Intravenous Q6H  . pantoprazole sodium  40 mg Per Tube BID  . scopolamine  1 patch Transdermal Q72H  . sodium chloride flush  3 mL Intravenous Q12H   Continuous: . dextrose 5 % and 0.9% NaCl 100 mL/hr at 06/28/15 1700   LUN:GBMBOMQTT, LORazepam, morphine injection, morphine CONCENTRATE, ondansetron **OR** ondansetron (ZOFRAN) IV  Assesment: He was admitted with acute on chronic hypoxic and hypercapnic respiratory failure. He has COPD exacerbation. He has protein calorie malnutrition. He had metabolic encephalopathy on admission which is multifactorial and is at least partially from his baseline dementia. He has transitioned to comfort care. Principal Problem:   Acute respiratory failure with hypercapnia (HCC) Active Problems:   COPD with exacerbation (HCC)   Tobacco abuse   Essential hypertension   Protein calorie malnutrition (HCC)   Encephalopathy, metabolic   Acute on chronic respiratory failure ([place])   Palliative care encounter   DNR (do not resuscitate) discussion    Plan: I ordered more Ativan since it did help with his agitation. Otherwise I will sign off. Thanks for allowing me to see him with you    LOS: 7 days   Cordai Rodrigue L 06/29/2015, 8:43 AM

## 2015-06-30 NOTE — Clinical Social Work Note (Signed)
Patient Information    Patient Name Sex Shane Todd   Shane Todd, Shane Todd ([id number]) Male 11/29/1945     Room Bed   IC07 IC07-01    Patient Demographics    Address Phone E-mail Address   [address] [place] [phone] (Home) [phone] (Mobile) [email or web address]    Patient Ethnicity & Race    Ethnic Group Patient Race   Not Hispanic or Latino White or Caucasian    Emergency Contact(s)    Name Relation Home Work Mobile   Kundert,Claudia Spouse [phone]  [phone]    Documents on File      Status Date Received Description   Documents for the Patient   [place] HIPAA NOTICE OF PRIVACY - Scanned Received 04/23/11    [place] E-Signature HIPAA Notice of Privacy Not Received     [place] E-Signature HIPAA Notice of Privacy Spanish Not Received     Driver's License Not Received     Insurance Card Received 10/01/11    Advance Directives/Living Will/HCPOA/POA Not Received     Financial Application Not Received     [place] HIPAA NOTICE OF PRIVACY - Scanned Not Received     Insurance Card Received 10/28/14 MEDICARE & [place]UT OF [place]-[facility]   [facility] HIPAA Notice of Privacy - Scanned Not Received     [employer] HIPAA Notice of Privacy - E Signature Signed 05/12/12 CCS   Other Not Received     Driver's License Received 05/12/12 Exp: 02/2014-CCS   [facility] HIPAA Notice of Privacy - Scanned Received 05/12/12 CCS   [employer] HIPAA Notice of Privacy - Scanned Received 06/24/12 FP-CCS   Other Photo ID Not Received     HIM ROI Authorization  10/29/14 RCGD--10/28/14 prog note to PCP (koberlein)   AMB Correspondence  08/23/14 PROGRESS NOTES [facility]   Documents for the Encounter   AOB (Assignment of Insurance Benefits) Not Received     E-signature AOB Received 06/20/2015    MEDICARE RIGHTS Not Received     E-signature Medicare Rights Received 06/30/2015     Ultrasound  06/13/2015    Consent Form  06/27/15    Cardiac Monitoring Strip  06/26/2015     Admission Information    Attending Provider Admitting Provider Admission Type Admission Date/Time   Erick Blinks, MD Houston Siren, MD Emergency 06/26/2015 [phone]   Discharge Date Hospital Service Auth/Cert Status Service Area    Internal Medicine Incomplete [place] SERVICE AREA   Unit Room/Bed Admission Status   AP-ICCUP NURSING IC07/IC07-01 Admission (Confirmed)         Admission    Complaint   COPD--SOB, Upper GI bleed    Hospital Account    Name Acct ID Class Status Primary Coverage   [employer], [place] [id number] Inpatient Open MEDICARE - MEDICARE PART A AND B        Guarantor Account (for Hospital Account [id number])    Name Relation to Pt Service Area Active? Acct Type   [name] Self CHSA Yes Personal/Family   Address Phone       [address] [place] [place], [place] [address] [phone](H)          Coverage Information (for Hospital Account [id number])    1. MEDICARE/MEDICARE PART A AND B    F/O Payor/Plan Precert #   MEDICARE/MEDICARE PART A AND B    Subscriber Subscriber #   Aarnav, Ste[place] [id number]   Address Phone   [id number]  [place], [place] [address]        2. MUTUAL OF [place] [place] MCR SUP    F/O Payor/Plan Precert #   [id number] OF [place]/[facility]    Subscriber Subscriber #   Liana Geroldverette, Tej A [id number]   Address Phone   [address] [place] [address] [place] [id number] [phone]

## 2015-06-30 NOTE — Progress Notes (Signed)
PROGRESS NOTE    Shane Todd  PYK:[id number] DOB: [birth year] DOA: 06/21/2015 PCP: Rocky Morel, MD  Outpatient Specialists:  Brief Narrative: 66 yom with severe COPD with tobacco use, dementia presented with respiratory failure and was reported to refuse to wear BiPAP. He was intubated 5/10 and after multiple consultations with family, pulmonary and PMT extubation was decided for 5/16 and per family, he was been placed on comfort care. Also noted to have GI bleed with EGD revealing gastric ucler and placed on PPI. He has been transitioned from oral to IV morphine.   Assessment & Plan: Principal Problem:   Acute respiratory failure with hypercapnia (HCC) Active Problems:   COPD with exacerbation (HCC)   Tobacco abuse   Essential hypertension   Protein calorie malnutrition (HCC)   Encephalopathy, metabolic   Acute on chronic respiratory failure ([place])   Palliative care encounter   DNR (do not resuscitate) discussion  1. Acute respiratory failure with hypercapnia, patient extubated 5/16 with no plans for reintubation. Patient does not want to wear any NIPPV. He is receiving morphine for respiratory distress. PMT has consulted with family and they have decided with the patient's wishes to transition to full comfort care. He is currently on a morphine infusion and appears to be resting comfortably. Planss to transition to resident hospice if bed is available. Otherwise he can transition out of ICU today.  2. COPD exacerbation. Treated with Levaquin, nebs, and steroids.     DVT prophylaxis: None-Comfort care Code Status: DNR/DNI-Comfort care Family Communication: Family bedside.  Disposition Plan: Transfer to medical bed. Prognosis guarded.   Consultants:   Palliative   Pulmonology   Gastroenterology   Procedures:   Intubation 5/10>>5/16  Antimicrobials:   Levaquin 5/10>>5/17   Subjective: Per family bedside. He slept through the night. Shakes his head yes  or no to answer questions and smiles. He appears to be more comfortable on the morphine drip.   Objective: Filed Vitals:   06/30/15 0200 06/30/15 0300 06/30/15 0400 06/30/15 0500  BP: 119/45 118/51 113/45 103/46  Pulse: 75 76 78 76  Temp:    97.8 F (36.6 C)  TempSrc:    Axillary  Resp: 12 34 12 14  Height:      Weight:      SpO2: 100% 100% 100% 100%    Intake/Output Summary (Last 24 hours) at 06/30/15 0644 Last data filed at 06/30/15 0500  Gross per 24 hour  Intake 328.13 ml  Output   2700 ml  Net -2371.87 ml   Filed Weights   06/27/15 1930 06/28/15 0500 06/29/15 0500  Weight: 77 kg (169 lb 12.1 oz) 72.5 kg (159 lb 13.3 oz) 67.9 kg (149 lb 11.1 oz)    Examination: General exam: Appears calm and comfortable. Does not answer questions but follows commands.  Respiratory system: Clear breath sounds bilaterally.  Cardiovascular system: S1 & S2 heard, RRR. No JVD, murmurs, rubs, gallops or clicks. No pedal edema. .  Data Reviewed  CBC:  Recent Labs Lab 07/02/2015 0858 07/10/2015 1412 07/02/2015 2050 06/25/15 0222 06/27/15 0431  WBC 10.1 11.5* 9.5 9.9 15.3*  HGB 11.4* 11.6* 10.4* 10.6* 11.4*  HCT 36.2* 37.2* 33.6* 33.9* 37.1*  MCV 97.6 97.9 98.0 98.0 99.5  PLT 218 221 192 208 539   Basic Metabolic Panel:  Recent Labs Lab 06/26/15 0440  NA 143  K 4.5  CL 110  CO2 33*  GLUCOSE 203*  BUN 31*  CREATININE 0.56*  CALCIUM 7.8*  Liver Function Tests:  Recent Labs Lab 06/26/15 0440  AST 12*  ALT 10*  ALKPHOS 52  BILITOT 0.2*  PROT 4.8*  ALBUMIN 2.2*  Cardiac Enzymes: No results for input(s): CKTOTAL, CKMB, CKMBINDEX, TROPONINI in the last 168 hours.  Recent Labs Lab 06/27/15 1929 06/27/15 2357 06/28/15 0452 06/28/15 0731 06/28/15 1121  GLUCAP 121* 224* 223* 199* 121*   Urine analysis:    Component Value Date/Time   COLORURINE YELLOW 07/02/2015 1125   APPEARANCEUR CLEAR 07/07/2015 1125   LABSPEC >1.030* 06/23/2015 1125   PHURINE 6.0 06/25/2015  1125   GLUCOSEU NEGATIVE 06/18/2015 1125   HGBUR NEGATIVE 06/29/2015 1125   [place] 06/29/2015 1125   KETONESUR NEGATIVE 07/08/2015 1125   PROTEINUR 30* 07/05/2015 1125   UROBILINOGEN 0.2 07/03/2013 0425   NITRITE NEGATIVE 06/27/2015 1125   LEUKOCYTESUR NEGATIVE 06/16/2015 1125   Sepsis Labs: _0 (procalcitonin:4,lacticidven:4)  ) Recent Results (from the past 240 hour(s))  MRSA PCR Screening     Status: None   Collection Time: 06/25/2015  2:10 PM  Result Value Ref Range Status   MRSA by PCR NEGATIVE NEGATIVE Final    Comment:        The GeneXpert MRSA Assay (FDA approved for NASAL specimens only), is one component of a comprehensive MRSA colonization surveillance program. It is not intended to diagnose MRSA infection nor to guide or monitor treatment for MRSA infections.   Culture, respiratory (NON-Expectorated)     Status: None   Collection Time: 07/09/2015  4:00 PM  Result Value Ref Range Status   Specimen Description TRACHEAL ASPIRATE  Final   Special Requests Immunocompromised  Final   Gram Stain   Final    MODERATE WBC PRESENT, PREDOMINANTLY PMN NO SQUAMOUS EPITHELIAL CELLS SEEN NO ORGANISMS SEEN Performed at [facility]    Culture   Final    NO GROWTH 2 DAYS Performed at [facility]    Report Status 06/27/2015 FINAL  Final         Radiology Studies: No results found.      Scheduled Meds: . chlorhexidine gluconate (SAGE KIT)  15 mL Mouth Rinse BID  . scopolamine  1 patch Transdermal Q72H  . sodium chloride flush  3 mL Intravenous Q12H   Continuous Infusions: . dextrose 5 % and 0.9% NaCl 10 mL/hr at 06/29/15 1800  . morphine 4 mg/hr (06/29/15 1043)     LOS: 8 days    Time spent: 25 minutes   Kathie Dike, MD [facility] Pager [phone] If 7PM-7AM, please contact night-coverage [email or web address] Password [email or web address] 06/30/2015, 6:44 AM    By signing my name below, I, Rennis Harding, attest that  this documentation has been prepared under the direction and in the presence of Kathie Dike, MD. Electronically signed: Rennis Harding, Scribe. 06/30/2015 9:07am.  I, Dr. Kathie Dike, personally performed the services described in this documentaiton. All medical record entries made by the scribe were at my direction and in my presence. I have reviewed the chart and agree that the record reflects my personal performance and is accurate and complete Kathie Dike, MD, 06/30/2015 9:13 AM

## 2015-06-30 NOTE — Clinical Social Work Note (Signed)
Clinical Social Work Assessment  Patient Details  Name: Shane Todd MRN: [id number] Date of Birth: Feb 13, 1945  Date of referral:  06/30/15               Reason for consult:  End of Life/Hospice                Permission sought to share information with:    Permission granted to share information::     Name::        Agency::     Relationship::     Contact Information:     Housing/Transportation Living arrangements for the past 2 months:  Single Family Home Source of Information:  Spouse Patient Interpreter Needed:  None Criminal Activity/Legal Involvement Pertinent to Current Situation/Hospitalization:  No - Comment as needed Significant Relationships:  Adult Children, Spouse Lives with:  Spouse Do you feel safe going back to the place where you live?  Yes Need for family participation in patient care:  Yes (Comment)  Care giving concerns: None identified by Shane Todd.    Social Worker assessment / plan:  Patient was surrounded by multiple family members.  Patient's wife and CSW spoke privately.  She advised that patient has been at home with her since March when [facility] [place] [place].  She stated that she realizes that patient is at the end of his life and feels that Hospice Home is an appropriate choice. CSW provided emotional support to patient's family. CS[facility].     Employment status:  Retired Health and safety inspectornsurance information:  Medicare PT Recommendations:  Not assessed at this time Information / Referral to community resources:     Patient/Family's Response to care: Spouse is agreeable to patient [facility].  Patient/Family's Understanding of and Emotional Response to Diagnosis, Current Treatment, and Prognosis:  Family realizes that patient is at the end of his life.   Emotional Assessment Appearance:  Developmentally  appropriate Attitude/Demeanor/Rapport:  Unable to Assess Affect (typically observed):  Unable to Assess Orientation:    Alcohol / Substance use:  Not Applicable Psych involvement (Current and /or in the community):  No (Comment)  Discharge Needs  Concerns to be addressed:   (End of life care) Readmission within the last 30 days:  No Current discharge risk:  None Barriers to Discharge:  No Barriers Identified   Annice NeedySettle, Caroline Longie D, LCSW 06/30/2015, 4:10 PM

## 2015-06-30 NOTE — Progress Notes (Signed)
Daily Progress Note   Patient Name: Shane Todd       Date: 06/30/2015 DOB: Aug 17, 1945  Age: 70 y.o. MRN#: [id number] Attending Physician: Kathie Dike, MD Primary Care Physician: Rocky Morel, MD Admit Date: 06/14/2015  Reason for Consultation/Follow-up: Establishing goals of care, Inpatient hospice referral, Non pain symptom management, Psychosocial/spiritual support and Terminal Care  Subjective: Shane Todd is lying quietly in bed, his family is at bedside. There are multiple family members gathered today. Shane Todd is able to open his eyes when I touch his arm, he briefly makes eye contact with me, but does not try to communicate verbally. Family shares that they feel his symptoms are well-managed. We talk about moving to a larger room when one becomes available. We also talk about disposition to the hospice home in [place]. Shane Todd seems stable at this time, but unsure of prognosis, and therefore disposition to hospice home. 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 Some Life review is discussed.  Length of Stay: 8  Current Medications: Scheduled Meds:  . chlorhexidine gluconate (SAGE KIT)  15 mL Mouth Rinse BID  . scopolamine  1 patch Transdermal Q72H  . sodium chloride flush  3 mL Intravenous Q12H    Continuous Infusions: . dextrose 5 % and 0.9% NaCl 10 mL/hr at 06/30/15 1100  . morphine 4 mg/hr (06/30/15 0900)    PRN Meds: ipratropium-albuterol, LORazepam, ondansetron **OR** ondansetron (ZOFRAN) IV  Physical Exam  Constitutional: No distress.  Cardiovascular: Normal rate and regular rhythm.   Pulmonary/Chest: Effort normal. No respiratory distress.  Abdominal: Soft.  Neurological:  Lethargic, opens eyes to touch, no verbal  response.  Skin: Skin is warm and dry.  Nursing note and vitals reviewed.           Vital Signs: BP 123/45 mmHg  Pulse 80  Temp(Src) 97.7 F (36.5 C) (Axillary)  Resp 19  Ht 6' 2"  (1.88 m)  Wt 67.9 kg (149 lb 11.1 oz)  BMI 19.21 kg/m2  SpO2 100% SpO2: SpO2: 100 % O2 Device: O2 Device: Nasal Cannula O2 Flow Rate: O2 Flow Rate (L/min): 3 L/min  Intake/output summary:  Intake/Output Summary (Last 24 hours) at 06/30/15 1333 Last data filed at 06/30/15 1100  Gross per 24 hour  Intake 378.13 ml  Output  1700 ml  Net -1321.87 ml   LBM: Last BM Date: 06/26/15 Baseline Weight: Weight: 63.504 kg (140 lb) Most recent weight: Weight: 67.9 kg (149 lb 11.1 oz)       Palliative Assessment/Data:    Flowsheet Rows        Most Recent Value   Intake Tab    Referral Department  Hospitalist   Unit at Time of Referral  ICU   Palliative Care Primary Diagnosis  Pulmonary   Date Notified  06/26/15   Palliative Care Type  New Palliative care   Reason for referral  Psychosocial or Spiritual support   Date of Admission  07/05/2015   Date first seen by Palliative Care  06/27/15   # of days Palliative referral response time  1 Day(s)   # of days IP prior to Palliative referral  4   Clinical Assessment    Palliative Performance Scale Score  10%   Pain Max last 24 hours  Not able to report   Pain Min Last 24 hours  Not able to report   Dyspnea Max Last 24 Hours  Not able to report   Dyspnea Min Last 24 hours  Not able to report   Psychosocial & Spiritual Assessment    Palliative Care Outcomes    Patient/Family meeting held?  Yes   Who was at the meeting?  wife, [name], brothers Konrad Dolores and Francee Piccolo Zigmund Daniel), daughter Shane Todd, son Shane Todd.    Palliative Care Outcomes  Clarified goals of care, Provided advance care planning, Provided psychosocial or spiritual support   Patient/Family wishes: Interventions discontinued/not started   PEG   Palliative Care follow-up planned  -- [follow up at [facility]       Patient Active Problem List   Diagnosis Date Noted  . Palliative care encounter   . DNR (do not resuscitate) discussion   . Acute on chronic respiratory failure ([place]) 07/12/2015  . Dementia with behavioral disturbance 04/18/2015  . Protein-calorie malnutrition, severe 04/15/2015  . Encephalopathy, metabolic 73/53/2992  . Hypercapnic respiratory failure ([place]) 04/05/2015  . COPD exacerbation ([place]) 04/05/2015  . Acute respiratory failure with hypercapnia ([place])   . Dyspnea 07/02/2013  . Acute respiratory failure with hypoxia and hypercarbia ([place]) 07/02/2013  . Protein calorie malnutrition ([place]) 07/02/2013  . Acute encephalopathy 07/02/2013  . Community acquired bacterial pneumonia 07/02/2013  . CAP (community acquired pneumonia) 07/02/2013  . Left inguinal hernia 05/12/2012  . Anxiety   . Essential hypertension   . Benign hypertension 04/25/2011  . Anemia 04/24/2011  . Acute respiratory failure ([place]) 04/23/2011  . COPD with exacerbation ([place]) 04/23/2011  . Community acquired pneumonia 04/23/2011  . Sinus tachycardia ([place]) 04/23/2011  . Tobacco abuse 04/23/2011  . Hyperglycemia 04/23/2011  . Dehydration 04/23/2011    Palliative Care Assessment & Plan   Patient Profile: Shane Todd is a 70 year old man with the past medical history including severe COPD, inability to wear BiPAP at home, continued smoking, hypertension, and early stages vascular dementia. He was brought to the hospital about 1 month ago and spent a week on BiPAP. He was recently hospitalized again for acute on chronic respiratory failure due to COPD exacerbation. He is not recovered and he and family has decided to focus on comfort measures at this time.  Assessment: as above  Recommendations/Plan:  full comfort measures at this time, transfer to floor, considering hospice home if stable.  Goals of Care and Additional Recommendations:  Limitations on Scope of Treatment: Full Comfort Care  Code  Status:    Code Status Orders        Start     Ordered   06/26/15 0808  Do not attempt resuscitation (DNR)   Continuous    Question Answer Comment  In the event of cardiac or respiratory ARREST Do not call a "code blue"   In the event of cardiac or respiratory ARREST Do not perform Intubation, CPR, defibrillation or ACLS   In the event of cardiac or respiratory ARREST Use medication by any route, position, wound care, and other measures to relive pain and suffering. May use oxygen, suction and manual treatment of airway obstruction as needed for comfort.      06/26/15 5056    Code Status History    Date Active Date Inactive Code Status Order ID Comments User Context   06/28/2015  1:47 PM 06/26/2015  8:07 AM Full Code [id number]  Orvan Falconer, MD Inpatient   04/05/2015 10:15 PM 04/18/2015  4:49 PM Full Code [id number]  Theressa Millard, MD Inpatient   07/02/2013 11:18 PM 07/05/2013  1:49 PM Full Code [id number]  Louellen Molder, MD Inpatient   06/06/2012 10:45 AM 06/06/2012  7:01 PM Full Code [id number]  Harl Bowie, MD Inpatient   10/02/2011  4:26 AM 10/04/2011  3:16 PM Full Code [id number]  Derrill Kay, MD ED   04/23/2011  3:18 PM 04/27/2011  5:12 PM Full Code [id number]  Rexene Alberts, MD ED    Advance Directive Documentation        Most Recent Value   Type of Advance Directive  Living will   Pre-existing out of facility DNR order (yellow form or pink MOST form)     "MOST" Form in Place?         Prognosis:   Hours - Days  Discharge Planning:  Anticipated Hospital Death  Care plan was discussed with nursing staff, case manager, social worker, and Dr. Roderic Palau on next rounds.  Thank you for allowing the Palliative Medicine Team to assist in the care of this patient.   Time In: 0950 Time Out: 1025 Total Time 35 minutes Prolonged Time Billed  no       Greater than 50%  of this time was spent counseling and coordinating care related to the above assessment and plan.  Takirah Binford A,  NP  Please contact Palliative Medicine Team phone at [phone] for questions and concerns.

## 2015-07-01 DIAGNOSIS — G9341 Metabolic encephalopathy: Secondary | ICD-10-CM

## 2015-07-01 DIAGNOSIS — J9620 Acute and chronic respiratory failure, unspecified whether with hypoxia or hypercapnia: Secondary | ICD-10-CM

## 2015-07-01 DIAGNOSIS — I10 Essential (primary) hypertension: Secondary | ICD-10-CM

## 2015-07-14 NOTE — Discharge Summary (Signed)
Death Summary  Shane Todd ZOX:[id number] DOB: Feb 11, 1946 DOA: 06/24/2015  PCP: Trinna PostKOBERLEIN, JUNELL CAROL, MD  Admit date: 06/16/2015 Date of Death: Oct 25, 2015  Final Diagnoses:  Principal Problem:   Acute on chronic respiratory failure (HCC) Active Problems:   COPD with exacerbation (HCC)   Tobacco abuse   Essential hypertension   Protein calorie malnutrition (HCC)   Encephalopathy, metabolic   Palliative care encounter   DNR (do not resuscitate) discussion   History of present illness:  Shane Todd is an 70 y.o. male with hx of severe COPD, chronic respiratory failure, who was refusing to wear Bipap QHS at home, continued smoker, hx of HTN, anxiety, vascular dementia, presented to the ER with a one week history of progressive SOB, green sputum productive coughs and confusion. He did not have CP, fever, or chills. Evaluation in the ER showed negative CXR and normal WBC and normal renal fx tests. His ABG #1 showed 7.18/95/ POx=160 on 5 L [place]. He was placed on Bipap for several hours, and follow up ABG #2 showed 7.21/86/POx 136. He was admitted for acute on chronic respiratory failure due to COPD exacerbation.  Hospital Course:  This patient was advised to the hospital with acute on chronic respiratory failure related to COPD exacerbation. He was initially started on BiPAP but eventually required intubation. He was treated with antibiotics, steroids and bronchodilators. Pulmonology followed in his care. Due to the patient's severe respiratory disease and difficulty weaning from the vent, palliative care was consulted to discuss goals of care. Patient has had repeated hospitalizations for COPD exacerbation and has refused or BiPAP at home. After discussing with the family, decision was made to extubate the patient without any plans for reintubation. Unfortunately after intubation, patient became increasingly short of breath. He was eventually transitioned to comfort care and started on a  morphine infusion for respiratory distress. On 5/19 at 6:26 AM, patient was found without heart sounds or breath sounds and was pronounced at . Family was at bedside and was offered support.   Time: 15mins  Signed:  Danisa Kopec  [facility] Oct 25, 2015, 8:14 AM

## 2015-07-14 NOTE — Progress Notes (Signed)
Pt present asystole on heart monitor. Assessed pt, no heart sounds or lung sounds heard. Time of death 0626, death verified by charge nurse. Family members at bedside. Dr. Onalee Huaavid notified at [phone]. Page Jenita Seashorerater at [facility] notified at [phone] reference number [id number],  pt has been rule out as a donor.

## 2015-07-14 DEATH — deceased

## 2015-07-26 DIAGNOSIS — R0600 Dyspnea, unspecified: Secondary | ICD-10-CM | POA: Insufficient documentation

## 2015-07-26 DIAGNOSIS — R140 Abdominal distension (gaseous): Secondary | ICD-10-CM | POA: Insufficient documentation

## 2017-06-09 IMAGING — CR DG CHEST 1V PORT
1 series · 2 of 2 positions shown · non-contrast
Comparison: 07/02/2013

CLINICAL DATA: Short of breath for 1 day

EXAM:
PORTABLE CHEST 1 VIEW

[Series 1: ap portable · 0.17mm/px · 2 of 2 slices shown]
[im 1/2]
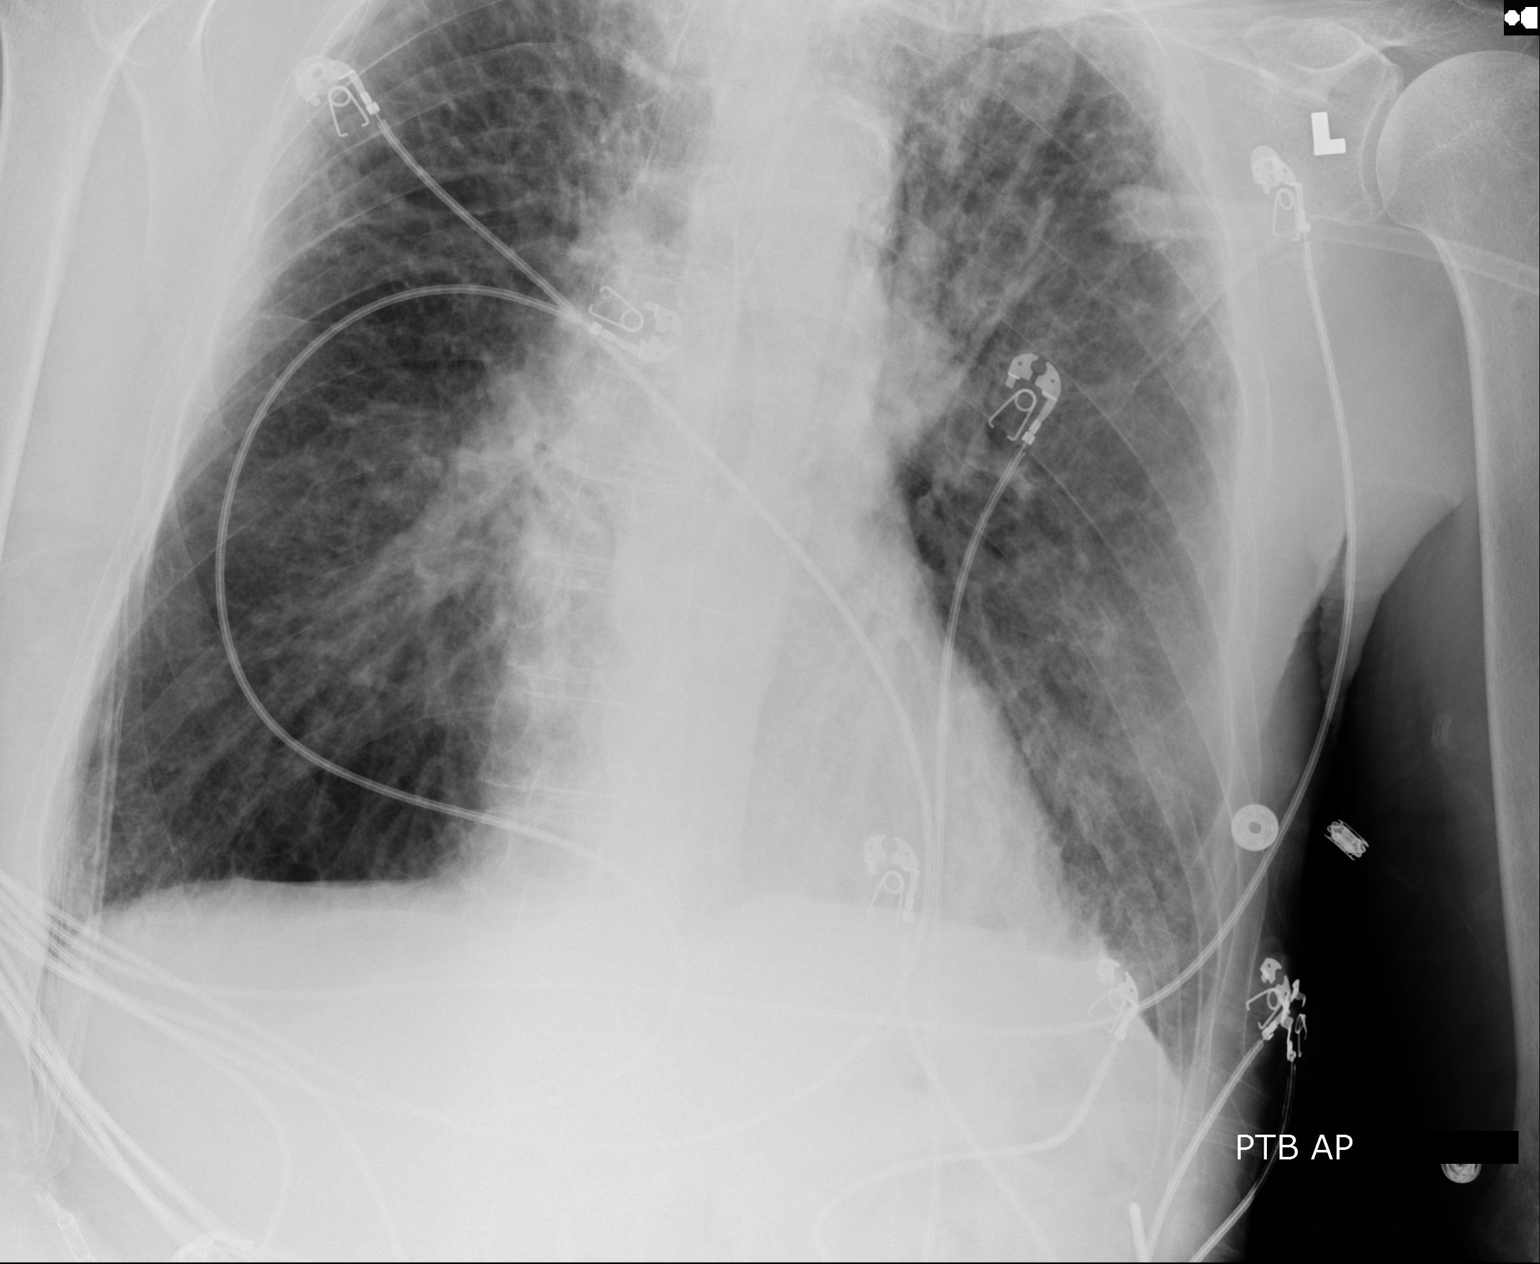
[im 2/2]
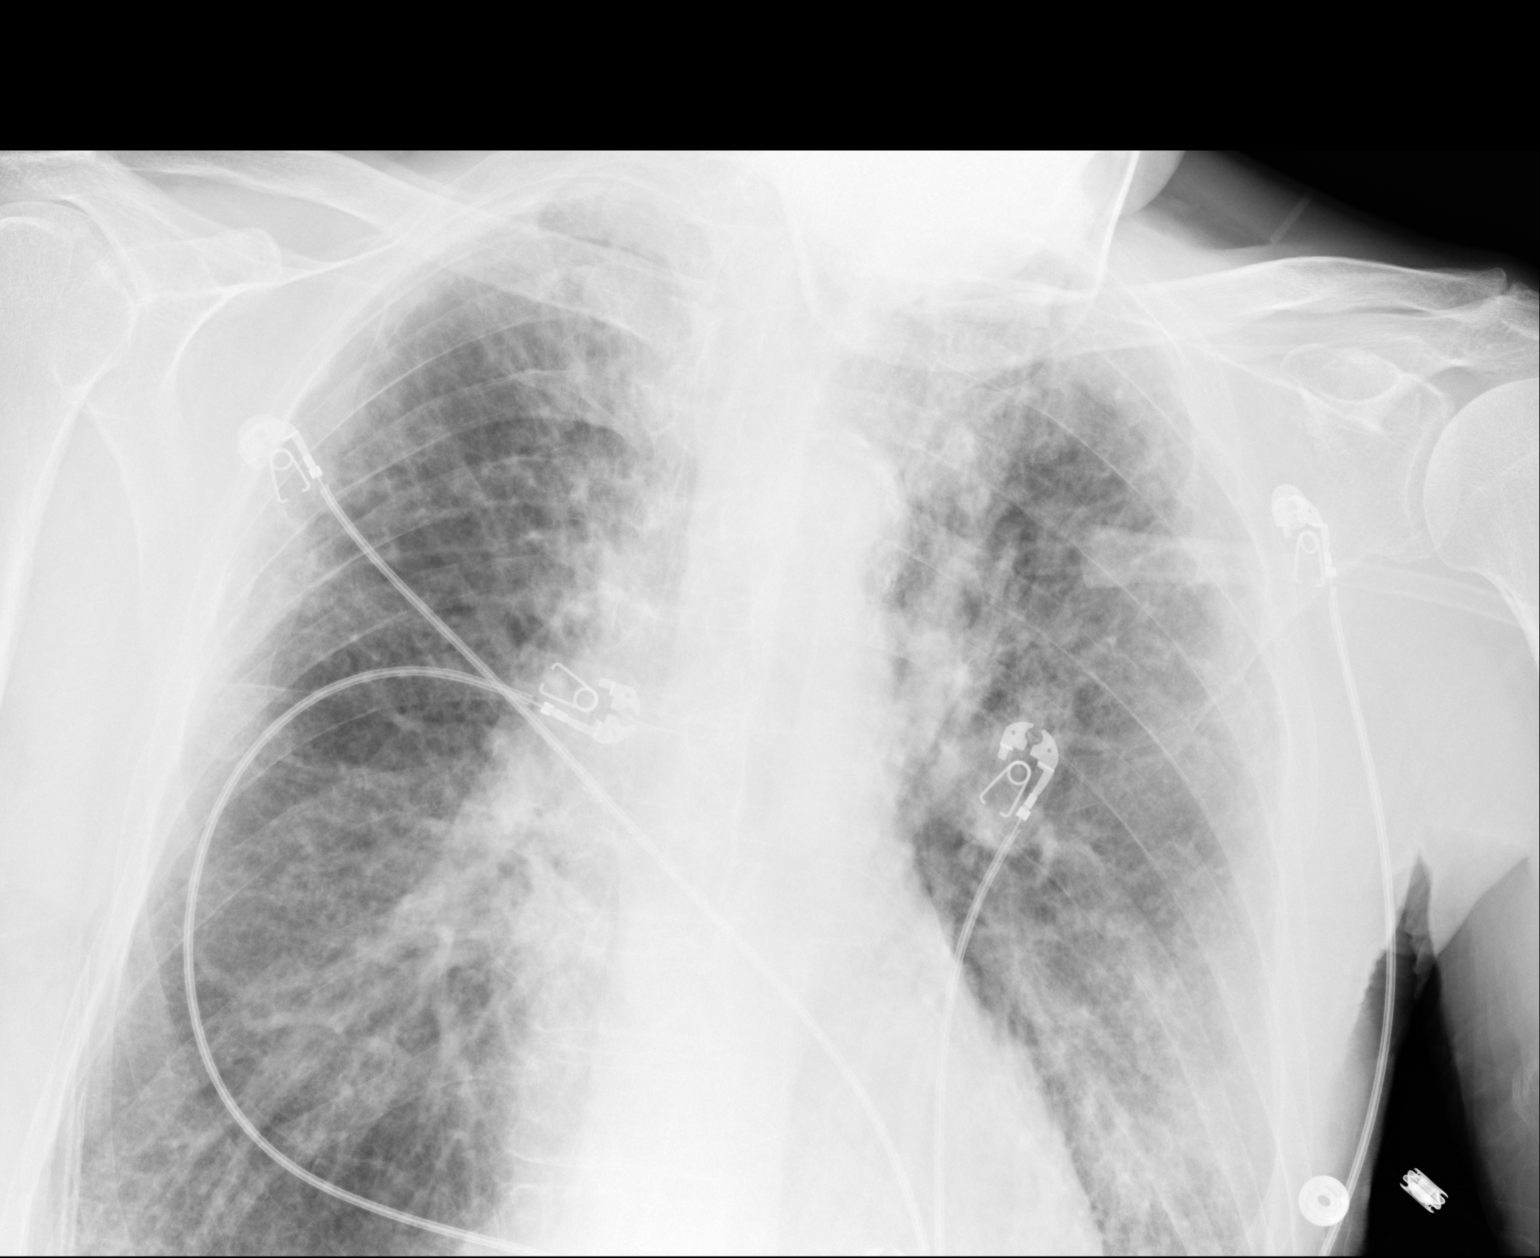

[2 of 2 positions shown; findings below may reference images not displayed]

FINDINGS: Diffuse vascular congestion. Upper normal heart size. [place] B-lines
at the lung bases. Hyperaeration. No pneumothorax. More confluent
opacity at the left base may represent airspace edema.
IMPRESSION: Findings above for worrisome for mild interstitial edema and
vascular congestion. Airspace disease at the left base is
nonspecific and may represent focal edema or an inflammatory
process.

## 2017-06-12 IMAGING — CR DG CHEST 1V PORT
1 series · 2 of 2 positions shown · non-contrast
Comparison: 04/05/2015

CLINICAL DATA: Fever.  COPD.

EXAM:
PORTABLE CHEST 1 VIEW

[Series 1: ap portable · 0.17mm/px · 2 of 2 slices shown]
[im 1/2]
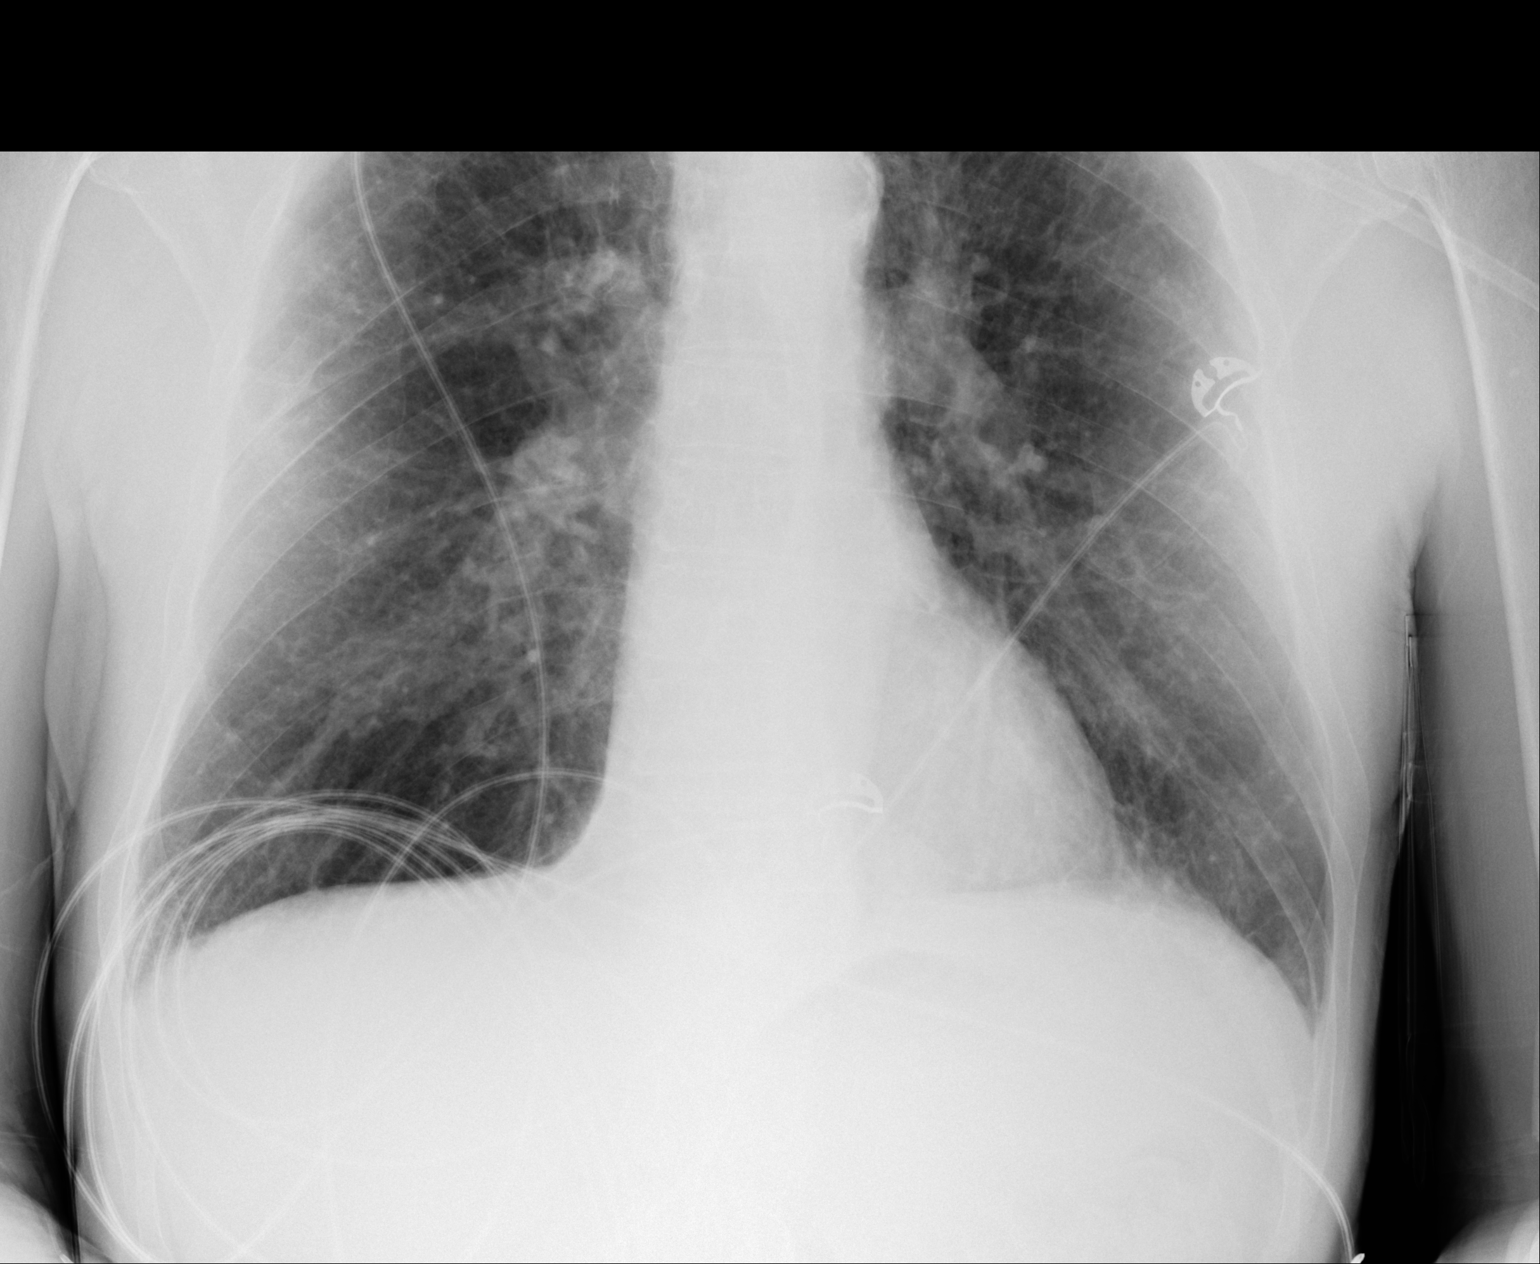
[im 2/2]
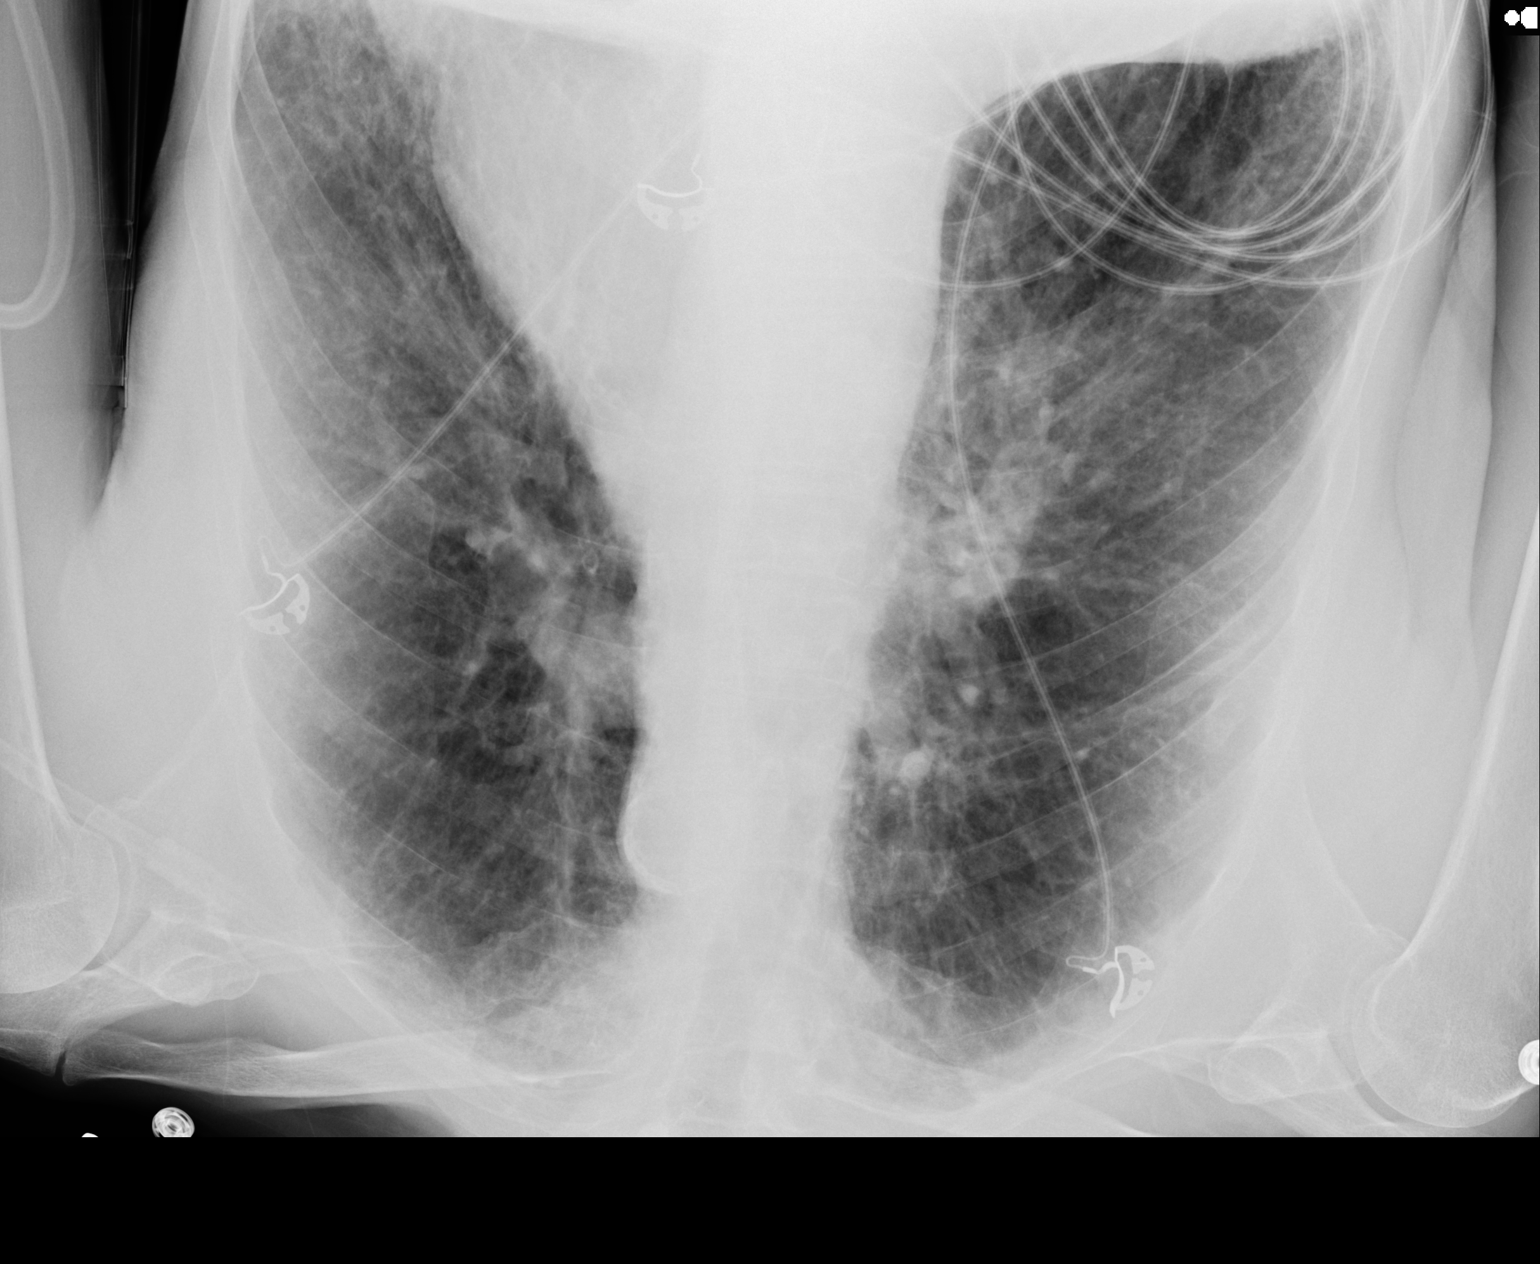

[2 of 2 positions shown; findings below may reference images not displayed]

FINDINGS: Pulmonary hyperinflation and diffuse interstitial prominence appears
stable and consistent with COPD. No evidence of pulmonary
consolidation or pleural effusion. Heart size and mediastinal
contours are within normal limits.
IMPRESSION: Stable exam.  COPD.  No active disease.
